# Patient Record
Sex: Female | Born: 1945
Health system: Southern US, Community
[De-identification: ages and names within clinical notes are randomized; demographics above are authoritative.]

## PROBLEM LIST (undated history)

## (undated) DIAGNOSIS — Z87440 Personal history of urinary (tract) infections: Secondary | ICD-10-CM

## (undated) DIAGNOSIS — I10 Essential (primary) hypertension: Secondary | ICD-10-CM

## (undated) DIAGNOSIS — Z8619 Personal history of other infectious and parasitic diseases: Secondary | ICD-10-CM

## (undated) DIAGNOSIS — E079 Disorder of thyroid, unspecified: Secondary | ICD-10-CM

## (undated) DIAGNOSIS — N3281 Overactive bladder: Secondary | ICD-10-CM

## (undated) DIAGNOSIS — E785 Hyperlipidemia, unspecified: Secondary | ICD-10-CM

## (undated) HISTORY — DX: Overactive bladder: N32.81

## (undated) HISTORY — DX: Personal history of urinary (tract) infections: Z87.440

## (undated) HISTORY — DX: Disorder of thyroid, unspecified: E07.9

## (undated) HISTORY — DX: Hyperlipidemia, unspecified: E78.5

## (undated) HISTORY — DX: Essential (primary) hypertension: I10

## (undated) HISTORY — DX: Personal history of other infectious and parasitic diseases: Z86.19

---

## 1998-06-16 ENCOUNTER — Ambulatory Visit (HOSPITAL_COMMUNITY): Admission: RE | Admit: 1998-06-16 | Discharge: 1998-06-16 | Payer: Self-pay | Admitting: Family Medicine

## 1999-06-23 ENCOUNTER — Ambulatory Visit (HOSPITAL_COMMUNITY): Admission: RE | Admit: 1999-06-23 | Discharge: 1999-06-23 | Payer: Self-pay | Admitting: Family Medicine

## 1999-06-23 ENCOUNTER — Encounter: Payer: Self-pay | Admitting: Family Medicine

## 1999-10-29 ENCOUNTER — Ambulatory Visit (HOSPITAL_COMMUNITY): Admission: RE | Admit: 1999-10-29 | Discharge: 1999-10-29 | Payer: Self-pay | Admitting: Gastroenterology

## 2000-05-12 ENCOUNTER — Other Ambulatory Visit: Admission: RE | Admit: 2000-05-12 | Discharge: 2000-05-12 | Payer: Self-pay | Admitting: Gynecology

## 2000-05-12 ENCOUNTER — Encounter (INDEPENDENT_AMBULATORY_CARE_PROVIDER_SITE_OTHER): Payer: Self-pay

## 2000-07-27 ENCOUNTER — Other Ambulatory Visit: Admission: RE | Admit: 2000-07-27 | Discharge: 2000-07-27 | Payer: Self-pay | Admitting: Gynecology

## 2001-05-12 ENCOUNTER — Encounter: Payer: Self-pay | Admitting: Internal Medicine

## 2001-05-12 ENCOUNTER — Ambulatory Visit (HOSPITAL_COMMUNITY): Admission: RE | Admit: 2001-05-12 | Discharge: 2001-05-12 | Payer: Self-pay | Admitting: Internal Medicine

## 2001-08-14 ENCOUNTER — Other Ambulatory Visit: Admission: RE | Admit: 2001-08-14 | Discharge: 2001-08-14 | Payer: Self-pay | Admitting: Gynecology

## 2002-08-08 ENCOUNTER — Other Ambulatory Visit: Admission: RE | Admit: 2002-08-08 | Discharge: 2002-08-08 | Payer: Self-pay | Admitting: Gynecology

## 2003-01-21 ENCOUNTER — Encounter (INDEPENDENT_AMBULATORY_CARE_PROVIDER_SITE_OTHER): Payer: Self-pay

## 2003-01-21 ENCOUNTER — Ambulatory Visit (HOSPITAL_BASED_OUTPATIENT_CLINIC_OR_DEPARTMENT_OTHER): Admission: RE | Admit: 2003-01-21 | Discharge: 2003-01-21 | Payer: Self-pay | Admitting: Gynecology

## 2003-10-02 ENCOUNTER — Other Ambulatory Visit: Admission: RE | Admit: 2003-10-02 | Discharge: 2003-10-02 | Payer: Self-pay | Admitting: Gynecology

## 2004-09-08 ENCOUNTER — Other Ambulatory Visit: Admission: RE | Admit: 2004-09-08 | Discharge: 2004-09-08 | Payer: Self-pay | Admitting: Gynecology

## 2005-09-09 ENCOUNTER — Other Ambulatory Visit: Admission: RE | Admit: 2005-09-09 | Discharge: 2005-09-09 | Payer: Self-pay | Admitting: Gynecology

## 2005-09-22 ENCOUNTER — Ambulatory Visit (HOSPITAL_COMMUNITY): Admission: RE | Admit: 2005-09-22 | Discharge: 2005-09-22 | Payer: Self-pay | Admitting: Gynecology

## 2007-03-15 ENCOUNTER — Other Ambulatory Visit: Admission: RE | Admit: 2007-03-15 | Discharge: 2007-03-15 | Payer: Self-pay | Admitting: Gynecology

## 2009-03-06 ENCOUNTER — Ambulatory Visit (HOSPITAL_COMMUNITY): Admission: RE | Admit: 2009-03-06 | Discharge: 2009-03-06 | Payer: Self-pay | Admitting: Gynecology

## 2010-04-10 ENCOUNTER — Ambulatory Visit (HOSPITAL_COMMUNITY): Admission: RE | Admit: 2010-04-10 | Discharge: 2010-04-10 | Payer: Self-pay | Admitting: Internal Medicine

## 2011-04-26 ENCOUNTER — Other Ambulatory Visit: Payer: Self-pay | Admitting: Gynecology

## 2011-05-14 NOTE — Op Note (Signed)
   NAME:  Jill Daugherty, Jill Daugherty                          ACCOUNT NO.:  [id number]   MEDICAL RECORD NO.:  [id number]                   PATIENT TYPE:  AMB   LOCATION:  [facility]                                 FACILITY:  [facility]   PHYSICIAN:  Gretta Cool, M.D.              DATE OF BIRTH:  19-Apr-1946   DATE OF PROCEDURE:  01/21/2003  DATE OF DISCHARGE:                                 OPERATIVE REPORT   PREOPERATIVE DIAGNOSIS:  Endometrial polyp with recurring abnormal uterine  bleeding.   POSTOPERATIVE DIAGNOSIS:  Endometrial polyp with recurring abnormal uterine  bleeding.   PROCEDURE:  1. Hysteroscopy  2. Resection of endometrial polyp.  3. Total endometrial resection for ablation.   SURGEON:  Gretta Cool, M.D.   ANESTHESIA:  IV sedation to general anesthesia with oral airway plus  paracervical block.   DESCRIPTION OF PROCEDURE:  Under excellent anesthesia, as above, with the  patient prepped and draped in [employer], the cervix was grasped with a  single-tooth tenaculum and pulled down into view.  The cervix was then  progressively dilated with a series of Pratt dilators to accommodate a 7 mm  resectoscope.  The cervix was nulliparous and was therefore difficult to  dilate.  Question of dilating a false passage was considered.  With the  hysteroscopic examination, the endometrial cavity was confirmed.  Once the  endometrial cavity was entered, the entire cavity was noted to be filled by  a polyp.  The cavity was quite small.  With meticulous resection, the entire  endometrial polyp was removed and submitted for pathologic examination.  There were no abnormalities identified and no significant bleeding after  resection of the polyp.  The bleeding was minimal even with minimal reduced  pressure to 30 mm.  At the end of the procedure, there were no  complications.  The patient returned to the recovery room in excellent  condition.     Gretta Cool, M.D.    CWL/MEDQ  D:  01/21/2003  T:  01/21/2003  Job:  [id number]

## 2011-09-15 ENCOUNTER — Emergency Department (HOSPITAL_COMMUNITY)
Admission: EM | Admit: 2011-09-15 | Discharge: 2011-09-15 | Disposition: A | Discharge status: Home / Self Care | Payer: No Typology Code available for payment source | Attending: Surgery | Admitting: Surgery

## 2011-09-15 ENCOUNTER — Emergency Department (HOSPITAL_COMMUNITY): Payer: No Typology Code available for payment source

## 2011-09-15 DIAGNOSIS — I10 Essential (primary) hypertension: Secondary | ICD-10-CM | POA: Insufficient documentation

## 2011-09-15 DIAGNOSIS — R079 Chest pain, unspecified: Secondary | ICD-10-CM | POA: Insufficient documentation

## 2011-11-09 ENCOUNTER — Other Ambulatory Visit: Payer: Self-pay | Admitting: Internal Medicine

## 2011-11-09 ENCOUNTER — Ambulatory Visit (HOSPITAL_COMMUNITY)
Admission: RE | Admit: 2011-11-09 | Discharge: 2011-11-09 | Disposition: A | Discharge status: Home / Self Care | Payer: Medicare Other | Source: Ambulatory Visit | Attending: Internal Medicine | Admitting: Internal Medicine

## 2011-11-09 DIAGNOSIS — M25559 Pain in unspecified hip: Secondary | ICD-10-CM | POA: Insufficient documentation

## 2011-11-09 DIAGNOSIS — R52 Pain, unspecified: Secondary | ICD-10-CM

## 2011-11-09 DIAGNOSIS — M79609 Pain in unspecified limb: Secondary | ICD-10-CM | POA: Insufficient documentation

## 2011-11-25 ENCOUNTER — Ambulatory Visit (HOSPITAL_COMMUNITY)
Admission: RE | Admit: 2011-11-25 | Discharge: 2011-11-25 | Disposition: A | Discharge status: Home / Self Care | Payer: Medicare Other | Source: Ambulatory Visit | Attending: Internal Medicine | Admitting: Internal Medicine

## 2011-11-25 DIAGNOSIS — R52 Pain, unspecified: Secondary | ICD-10-CM

## 2011-11-25 DIAGNOSIS — M79609 Pain in unspecified limb: Secondary | ICD-10-CM | POA: Insufficient documentation

## 2011-11-25 NOTE — Progress Notes (Signed)
*  PRELIMINARY RESULTS*  ABIs has been performed.  ABIs demonstrate normal arterial blood flow bilaterally at rest.  Jill Daugherty 11/25/2011, 11:57 AM

## 2012-07-26 ENCOUNTER — Other Ambulatory Visit: Payer: Self-pay | Admitting: Gynecology

## 2013-06-21 ENCOUNTER — Encounter: Payer: Self-pay | Admitting: Internal Medicine

## 2013-06-21 ENCOUNTER — Ambulatory Visit (INDEPENDENT_AMBULATORY_CARE_PROVIDER_SITE_OTHER): Payer: Medicare Other | Admitting: Internal Medicine

## 2013-06-21 VITALS — BP 148/88 | HR 52 | Temp 97.4°F | Ht 65.0 in | Wt 176.0 lb

## 2013-06-21 DIAGNOSIS — I152 Hypertension secondary to endocrine disorders: Secondary | ICD-10-CM | POA: Insufficient documentation

## 2013-06-21 DIAGNOSIS — E1169 Type 2 diabetes mellitus with other specified complication: Secondary | ICD-10-CM | POA: Insufficient documentation

## 2013-06-21 DIAGNOSIS — I10 Essential (primary) hypertension: Secondary | ICD-10-CM

## 2013-06-21 DIAGNOSIS — E039 Hypothyroidism, unspecified: Secondary | ICD-10-CM

## 2013-06-21 DIAGNOSIS — E785 Hyperlipidemia, unspecified: Secondary | ICD-10-CM

## 2013-06-21 NOTE — Assessment & Plan Note (Signed)
Will obtain most recent labs from Dr. Chestine Spore Continue current therapy

## 2013-06-21 NOTE — Assessment & Plan Note (Signed)
Elevated today but pt has not yet taken her medications Continue on a low sodium diet Work on exercise

## 2013-06-21 NOTE — Assessment & Plan Note (Signed)
Just started on synthroid No side effects she has noticed yet We will get labs from Dr. Chestine Spore

## 2013-06-21 NOTE — Progress Notes (Signed)
HPI  Pt presents to the clinic today to establish care. She does have a PCP Dr. Chestine Spore who manages her chronic health conditions. She was seeing Dr. Nicholas Lose for her gyn issues but he has now retired. She would like a female provider for her pap's and mammogram. That is why she is here today. She has no concerns today.  Flu: 08/2012 Tetanus: unsure Pneumovax: unsure Zostavax: unsure Pap: 2013- normal Mammogram: 2012 Eye doctor: yearly Dentist: yearly Colonoscopy: 1 time in the past but no longer wants to screen Bone density: unsure  Past Medical History  Diagnosis Date  . History of chicken pox   . Hypertension   . Hyperlipidemia   . Thyroid disease     Current Outpatient Prescriptions  Medication Sig Dispense Refill  . atenolol (TENORMIN) 100 MG tablet Take 100 mg by mouth daily.       Marland Kitchen atorvastatin (LIPITOR) 20 MG tablet Take 20 mg by mouth daily.       . hydrochlorothiazide (MICROZIDE) 12.5 MG capsule Take 12.5 mg by mouth daily.       Marland Kitchen levothyroxine (SYNTHROID, LEVOTHROID) 75 MCG tablet Take 75 mcg by mouth daily before breakfast.      . trimethoprim (TRIMPEX) 100 MG tablet Take 100 mg by mouth at bedtime.       No current facility-administered medications for this visit.    No Known Allergies  Family History  Problem Relation Age of Onset  . Heart disease Father   . Diabetes Paternal Grandmother   . Cancer Paternal Grandmother     History   Social History  . Marital Status: Married    Spouse Name: N/A    Number of Children: 1  . Years of Education: 12   Occupational History  . Retired    Social History Main Topics  . Smoking status: Never Smoker   . Smokeless tobacco: Never Used  . Alcohol Use: No  . Drug Use: No  . Sexually Active: Not Currently   Other Topics Concern  . Not on file   Social History Narrative   Regular exercise-no   Caffeine Use-yes    ROS:  Constitutional: Denies fever, malaise, fatigue, headache or abrupt weight changes.   HEENT: Denies eye pain, eye redness, ear pain, ringing in the ears, wax buildup, runny nose, nasal congestion, bloody nose, or sore throat. Respiratory: Denies difficulty breathing, shortness of breath, cough or sputum production.   Cardiovascular: Denies chest pain, chest tightness, palpitations or swelling in the hands or feet.  Gastrointestinal: Pt reports gas. Denies abdominal pain, bloating, constipation, diarrhea or blood in the stool.  GU: Denies frequency, urgency, pain with urination, blood in urine, odor or discharge. Musculoskeletal: Denies decrease in range of motion, difficulty with gait, muscle pain or joint pain and swelling.  Skin: Denies redness, rashes, lesions or ulcercations.  Neurological: Denies dizziness, difficulty with memory, difficulty with speech or problems with balance and coordination.   No other specific complaints in a complete review of systems (except as listed in HPI above).  PE:  BP 148/88  Pulse 52  Temp(Src) 97.4 F (36.3 C) (Oral)  Ht 5\' 5"  (1.651 m)  Wt 176 lb (79.833 kg)  BMI 29.29 kg/m2  SpO2 96% Wt Readings from Last 3 Encounters:  06/21/13 176 lb (79.833 kg)    General: Appears her stated age, well developed, well nourished in NAD. HEENT: Head: normal shape and size; Eyes: sclera white, no icterus, conjunctiva pink, PERRLA and EOMs intact; Ears: Tm's  gray and intact, normal light reflex; Nose: mucosa pink and moist, septum midline; Throat/Mouth: Teeth present, mucosa pink and moist, no lesions or ulcerations noted.  Neck: Normal range of motion. Neck supple, trachea midline. No massses, lumps or thyromegaly present.  Cardiovascular: Normal rate and rhythm. S1,S2 noted.  No murmur, rubs or gallops noted. No JVD or BLE edema. No carotid bruits noted. Pulmonary/Chest: Normal effort and positive vesicular breath sounds. No respiratory distress. No wheezes, rales or ronchi noted.  Abdomen: Soft and nontender. Normal bowel sounds, no bruits  noted. No distention or masses noted. Liver, spleen and kidneys non palpable. Musculoskeletal: Normal range of motion. No signs of joint swelling. No difficulty with gait.  Neurological: Alert and oriented. Cranial nerves II-XII intact. Coordination normal. +DTRs bilaterally. Psychiatric: Mood and affect normal. Behavior is normal. Judgment and thought content normal.      Assessment and Plan:  Preventative Health:  Will obtain records from Dr. Nicholas Lose and Dr. Chestine Spore to see if HM is UTD Schedule your mammogram before the end of the year Will repeat pap next year

## 2013-06-21 NOTE — Patient Instructions (Signed)

## 2013-06-21 NOTE — Assessment & Plan Note (Signed)
>>  ASSESSMENT AND PLAN FOR HYPERLIPIDEMIA ASSOCIATED WITH TYPE 2 DIABETES MELLITUS ([facility]) WRITTEN ON 06/21/2013 10:05 AM BY Lorre Munroe, NP  Will obtain most recent labs from Dr. Chestine Spore Continue current therapy

## 2013-10-19 ENCOUNTER — Ambulatory Visit (INDEPENDENT_AMBULATORY_CARE_PROVIDER_SITE_OTHER): Payer: Medicare Other | Admitting: Internal Medicine

## 2013-10-19 ENCOUNTER — Encounter: Payer: Self-pay | Admitting: Internal Medicine

## 2013-10-19 VITALS — BP 128/82 | HR 78 | Temp 98.6°F | Wt 172.8 lb

## 2013-10-19 DIAGNOSIS — R3915 Urgency of urination: Secondary | ICD-10-CM

## 2013-10-19 DIAGNOSIS — R350 Frequency of micturition: Secondary | ICD-10-CM

## 2013-10-19 DIAGNOSIS — N3090 Cystitis, unspecified without hematuria: Secondary | ICD-10-CM

## 2013-10-19 LAB — POCT URINALYSIS DIPSTICK
Bilirubin, UA: NEGATIVE
Ketones, UA: NEGATIVE
Protein, UA: NEGATIVE
Spec Grav, UA: <=1.005
pH, UA: 5

## 2013-10-19 NOTE — Patient Instructions (Signed)

## 2013-10-19 NOTE — Progress Notes (Signed)
HPI  Pt presents to the clinic today with c/o urinary frequency, urgency and overactive bladder. She was on trimethoprim for the past 2 years and Dr. Chestine Spore took her off of this medication to see if she could tolerate the wean. Since that time, she has had an increase in urinary symptoms. She is scheduled to followup with Dr. Chestine Spore on 10/28/2013. She denies fever, chills, nausea or back pain. She has not tried anything OTC.   Review of Systems  Past Medical History  Diagnosis Date  . History of chicken pox   . Hypertension   . Hyperlipidemia   . Thyroid disease     Family History  Problem Relation Age of Onset  . Heart disease Father   . Diabetes Paternal Grandmother   . Cancer Paternal Grandmother     History   Social History  . Marital Status: Married    Spouse Name: N/A    Number of Children: 1  . Years of Education: 12   Occupational History  . Retired    Social History Main Topics  . Smoking status: Never Smoker   . Smokeless tobacco: Never Used  . Alcohol Use: No  . Drug Use: No  . Sexual Activity: Not Currently   Other Topics Concern  . Not on file   Social History Narrative   Regular exercise-no   Caffeine Use-yes    No Known Allergies  Constitutional: Denies fever, malaise, fatigue, headache or abrupt weight changes.   GU: Pt reports urgency, frequency. Denies pain with urination, burning sensation, blood in urine, odor or discharge. Skin: Denies redness, rashes, lesions or ulcercations.   No other specific complaints in a complete review of systems (except as listed in HPI above).    Objective:   Physical Exam  BP 128/82  Pulse 78  Temp(Src) 98.6 F (37 C) (Oral)  Wt 172 lb 12.8 oz (78.382 kg)  BMI 28.76 kg/m2  SpO2 98% Wt Readings from Last 3 Encounters:  10/19/13 172 lb 12.8 oz (78.382 kg)  06/21/13 176 lb (79.833 kg)    General: Appears her stated age, well developed, well nourished in NAD. Cardiovascular: Normal rate and rhythm.  S1,S2 noted.  No murmur, rubs or gallops noted. No JVD or BLE edema. No carotid bruits noted. Pulmonary/Chest: Normal effort and positive vesicular breath sounds. No respiratory distress. No wheezes, rales or ronchi noted.  Abdomen: Soft and nontender. Normal bowel sounds, no bruits noted. No distention or masses noted. Liver, spleen and kidneys non palpable. Tender to palpation over the bladder area. No CVA tenderness.      Assessment & Plan:   Urgency, frequency secondary to cystitis, chronic:  Urinalysis-normal Restart the trimethoprim 100 mg daily Keep your follow up with Dr. Chestine Spore on 11/2 Watch for burning with urination, pain, fever or back pain- RTC if these develop Drink plenty of fluids  RTC as needed or if symptoms persist.

## 2013-10-19 NOTE — Progress Notes (Signed)
HPI: Pt presents to the office today with complaints of urinary urgency/frequency for two days. Pt states she feels she cannot completely emptying her bladder. Pt usually see Dr. Chestine Spore who has been treating her for frequent urinary tract infections vs overactive bladder. Pt was taking Trimpex 100mg  every night for over a year and was recently stopped per Dr. Chestine Spore as a trial for discontinuation. Pt states she did well without the medicaiton for 3 weeks, however started having symptoms. She denies fever, chills, burning with urination, pain, or blood in urine.   Past Medical History  Diagnosis Date  . History of chicken pox   . Hypertension   . Hyperlipidemia   . Thyroid disease   . Overactive bladder   . History of frequent urinary tract infections     Current Outpatient Prescriptions  Medication Sig Dispense Refill  . atenolol (TENORMIN) 100 MG tablet Take 100 mg by mouth daily.       Marland Kitchen atorvastatin (LIPITOR) 20 MG tablet Take 20 mg by mouth daily.       . hydrochlorothiazide (MICROZIDE) 12.5 MG capsule Take 12.5 mg by mouth daily.       Marland Kitchen levothyroxine (SYNTHROID, LEVOTHROID) 75 MCG tablet Take 75 mcg by mouth daily before breakfast.       No current facility-administered medications for this visit.    No Known Allergies   ROS:  Constitutional: Denies fever, malaise, fatigue, headache or abrupt weight changes.   Gastrointestinal: Denies abdominal pain, bloating, constipation, diarrhea or blood in the stool.  GU: Denies frequency, urgency, pain with urination, blood in urine, odor or discharge.  Neurological: Denies dizziness, difficulty with memory, difficulty with speech or problems with balance and coordination.   No other specific complaints in a complete review of systems (except as listed in HPI above).  PE:  BP 128/82  Pulse 78  Temp(Src) 98.6 F (37 C) (Oral)  Wt 172 lb 12.8 oz (78.382 kg)  BMI 28.76 kg/m2  SpO2 98% Wt Readings from Last 3 Encounters:  10/19/13  172 lb 12.8 oz (78.382 kg)  06/21/13 176 lb (79.833 kg)    General: Appears their stated age, well developed, well nourished in NAD. Abdomen: Soft and nontender. Normal bowel sounds, no bruits noted. No distention or masses noted. Liver, spleen and kidneys non palpable.No CVA tenderness. Musculoskeletal: Normal range of motion. No signs of joint swelling. No difficulty with gait.  Psychiatric: Mood and affect normal. Behavior is normal. Judgment and thought content normal.   Assessment and Plan: Restart Trimpex 100mg  by mouth daily at bedtime Try OTC Cranberry tablets as directed per box instructions for symptoms relief Follow up with Dr. Chestine Spore on Monday for appointment sooner than Nov. 2nd  Raahi Korber S, Student-NP

## 2013-10-29 ENCOUNTER — Other Ambulatory Visit: Payer: Self-pay | Admitting: Internal Medicine

## 2013-10-29 DIAGNOSIS — N760 Acute vaginitis: Secondary | ICD-10-CM

## 2013-10-29 DIAGNOSIS — Z1231 Encounter for screening mammogram for malignant neoplasm of breast: Secondary | ICD-10-CM

## 2013-10-29 DIAGNOSIS — E2839 Other primary ovarian failure: Secondary | ICD-10-CM

## 2013-12-07 ENCOUNTER — Ambulatory Visit
Admission: RE | Admit: 2013-12-07 | Discharge: 2013-12-07 | Disposition: A | Discharge status: Home / Self Care | Payer: Medicare Other | Source: Ambulatory Visit | Attending: Internal Medicine | Admitting: Internal Medicine

## 2013-12-07 DIAGNOSIS — E2839 Other primary ovarian failure: Secondary | ICD-10-CM

## 2013-12-07 DIAGNOSIS — Z1231 Encounter for screening mammogram for malignant neoplasm of breast: Secondary | ICD-10-CM

## 2014-01-02 LAB — HEMOGLOBIN A1C: Hgb A1c MFr Bld: 7.4 % — AB (ref 4.0–6.0)

## 2014-01-22 ENCOUNTER — Ambulatory Visit (INDEPENDENT_AMBULATORY_CARE_PROVIDER_SITE_OTHER): Payer: Medicare Other | Admitting: Internal Medicine

## 2014-01-22 ENCOUNTER — Encounter: Payer: Self-pay | Admitting: Internal Medicine

## 2014-01-22 VITALS — BP 130/80 | HR 54 | Temp 98.1°F | Wt 170.2 lb

## 2014-01-22 DIAGNOSIS — N390 Urinary tract infection, site not specified: Secondary | ICD-10-CM

## 2014-01-22 DIAGNOSIS — N3281 Overactive bladder: Secondary | ICD-10-CM

## 2014-01-22 DIAGNOSIS — N318 Other neuromuscular dysfunction of bladder: Secondary | ICD-10-CM

## 2014-01-22 MED ORDER — TRIMETHOPRIM 100 MG PO TABS: 100.0000 mg | ORAL_TABLET | Freq: Every day | ORAL | Status: DC

## 2014-01-22 NOTE — Progress Notes (Signed)
Subjective:    Patient ID: Jill Daugherty, female    DOB: [birth year], 68 y.o.   MRN: [id number]  HPI  Pt presents to the clinic today to discuss her Trimpex. She reports that in 08/2013, Dr. Carlis Abbott, took her off the medication to see if her symptoms had resolved. Since that time, she has had multiple urinary infections and has issues with overactive bladder. Dr. Carlis Abbott is refusing to put her back on the Trimpex because he states that she doesn't need it. He thinks it interferes with other medications that she is on. She would like a letter stating that she needs to be back on the Trimpex.  Review of Systems      Past Medical History  Diagnosis Date  . History of chicken pox   . Hypertension   . Hyperlipidemia   . Thyroid disease   . Overactive bladder   . History of frequent urinary tract infections     Current Outpatient Prescriptions  Medication Sig Dispense Refill  . atenolol (TENORMIN) 100 MG tablet Take 100 mg by mouth daily.       Marland Kitchen atorvastatin (LIPITOR) 20 MG tablet Take 20 mg by mouth daily.       . hydrochlorothiazide (MICROZIDE) 12.5 MG capsule Take 12.5 mg by mouth daily.       Marland Kitchen levothyroxine (SYNTHROID, LEVOTHROID) 75 MCG tablet Take 75 mcg by mouth daily before breakfast.      . trimethoprim (TRIMPEX) 100 MG tablet Take 100 mg by mouth at bedtime.       No current facility-administered medications for this visit.    No Known Allergies  Family History  Problem Relation Age of Onset  . Heart disease Father   . Diabetes Paternal Grandmother   . Cancer Paternal Grandmother     History   Social History  . Marital Status: Married    Spouse Name: N/A    Number of Children: 1  . Years of Education: 12   Occupational History  . Retired    Social History Main Topics  . Smoking status: Never Smoker   . Smokeless tobacco: Never Used  . Alcohol Use: No  . Drug Use: No  . Sexual Activity: Not Currently   Other Topics Concern  . Not on file   Social  History Narrative   Regular exercise-no   Caffeine Use-yes     Constitutional: Denies fever, malaise, fatigue, headache or abrupt weight changes.  GU: Pt reports frequency and incontinence. Denies urgency, pain with urination, burning sensation, blood in urine, odor or discharge.   No other specific complaints in a complete review of systems (except as listed in HPI above).  Objective:   Physical Exam   BP 130/80  Pulse 54  Temp(Src) 98.1 F (36.7 C) (Oral)  Wt 170 lb 4 oz (77.225 kg)  SpO2 98% Wt Readings from Last 3 Encounters:  01/22/14 170 lb 4 oz (77.225 kg)  10/19/13 172 lb 12.8 oz (78.382 kg)  06/21/13 176 lb (79.833 kg)    General: Appears her stated age, overweight but well developed, well nourished in NAD. Cardiovascular: Normal rate and rhythm. S1,S2 noted.  No murmur, rubs or gallops noted. No JVD or BLE edema. No carotid bruits noted. Pulmonary/Chest: Normal effort and positive vesicular breath sounds. No respiratory distress. No wheezes, rales or ronchi noted.  Abdomen: Soft and nontender. Normal bowel sounds, no bruits noted. No distention or masses noted. Liver, spleen and kidneys non palpable.  Assessment & Plan:   OAB with frequent UTI's:  Educated her about the difference in Trimpex and medications used to treat OAB such as Ditropan or Myrbetriq. 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

## 2014-01-22 NOTE — Progress Notes (Signed)
Pre-visit discussion using our clinic review tool. No additional management support is needed unless otherwise documented below in the visit note.  

## 2014-01-22 NOTE — Patient Instructions (Signed)
Overactive Bladder, Adult The bladder has two functions that are totally opposite of the other. One is to relax and stretch out so it can store urine (fills like a balloon), and the other is to contract and squeeze down so that it can empty the urine that it has stored. Proper functioning of the bladder is a complex mixing of these two functions. The filling and emptying of the bladder can be influenced by:  The bladder.  The spinal cord.  The brain.  The nerves going to the bladder.  Other organs that are closely related to the bladder such as prostate in males and the vagina in females. As your bladder fills with urine, nerve signals are sent from the bladder to the brain to tell you that you may need to urinate. Normal urination requires that the bladder squeeze down with sufficient strength to empty the bladder, but this also requires that the bladder squeeze down sufficiently long to finish the job. In addition the sphincter muscles, which normally keep you from leaking urine, must also relax so that the urine can pass. Coordination between the bladder muscle squeezing down and the sphincter muscles relaxing is required to make everything happen normally. With an overactive bladder sometimes the muscles of the bladder contract unexpectedly and involuntarily and this causes an urgent need to urinate. The normal response is to try to hold urine in by contracting the sphincter muscles. Sometimes the bladder contracts so strongly that the sphincter muscles cannot stop the urine from passing out and incontinence occurs. This kind of incontinence is called urge incontinence. Having an overactive bladder can be embarrassing and awkward. It can keep you from living life the way you want to. Many people think it is just something you have to put up with as you grow older or have certain health conditions. In fact, there are treatments that can help make your life easier and more pleasant. CAUSES  Many  things can cause an overactive bladder. Possibilities include:  Urinary tract infection or infection of nearby tissues such as the prostate.  Prostate enlargement.  In women, multiple pregnancies or surgery on the uterus or urethra.  Bladder stones, inflammation or tumors.  Caffeine.  Alcohol.  Medications. For example, diuretics (drugs that help the body get rid of extra fluid) increase urine production. Some other medicines must be taken with lots of fluids.  Muscle or nerve weakness. This might be the result of a spinal cord injury, a stroke, multiple sclerosis or Parkinson's disease.  Diabetes can cause a high urine volume which fills the bladder so quickly that the normal urge to urinate is triggered very strongly. SYMPTOMS   Loss of bladder control. You feel the need to urinate and cannot make your body wait.  Sudden, strong urges to urinate.  Urinating 8 or more times a day.  Waking up to urinate two or more times a night. DIAGNOSIS  To decide if you have overactive bladder, your healthcare provider will probably:  Ask about symptoms you have noticed.  Ask about your overall health. This will include questions about any medications you are taking.  Do a physical examination. This will help determine if there are obvious blockages or other problems.  Order some tests. These might include:  A blood test to check for diabetes or other health issues that could be contributing to the problem.  Urine testing. This could measure the flow of urine and the pressure on the bladder.  A test of your neurological   system (the brain, spinal cord and nerves). This is the system that senses the need to urinate. Some of these tests are called flow tests, bladder pressure tests and electrical measurements of the sphincter muscle.  A bladder test to check whether it is emptying completely when you urinate.  Cytoscopy. This test uses a thin tube with a tiny camera on it. It offers a  look inside your urethra and bladder to see if there are problems.  Imaging tests. You might be given a contrast dye and then asked to urinate. X-rays are taken to see how your bladder is working. TREATMENT  An overactive bladder can be treated in many ways. The treatment will depend on the cause. Whether you have a mild or severe case also makes a difference. Often, treatment can be given in your healthcare provider's office or clinic. Be sure to discuss the different options with your caregiver. They include:  Behavioral treatments. These do not involve medication or surgery:  Bladder training. For this, you would follow a schedule to urinate at regular intervals. This helps you learn to control the urge to urinate. At first, you might be asked to wait a few minutes after feeling the urge. In time, you should be able to schedule bathroom visits an hour or more apart.  Kegel exercises. These exercises strengthen the pelvic floor muscles, which support the bladder. By toning these muscles, they can help control urination, even if the bladder muscles are overactive. A specialist will teach you how to do these exercises correctly. They will require daily practice.  Weight loss. If you are obese or overweight, losing weight might stop your bladder from being overactive. Talk to your healthcare provider about how many pounds you should lose. Also ask if there is a specific program or method that would work best for you.  Diet change. This might be suggested if constipation is making your overactive bladder worse. Your healthcare provider or a nutritionist can explain ways to change what you eat to ease constipation. Other people might need to take in less caffeine or alcohol. Sometimes drinking fewer fluids is needed, too.  Protection. This is not an actual treatment. But, you could wear special pads to take care of any leakage while you wait for other treatments to take effect. This will help you avoid  embarrassment.  Physical treatments.  Electrical stimulation. Electrodes will send gentle pulses to the nerves or muscles that help control the bladder. The goal is to strengthen them. Sometimes this is done with the electrodes outside of the body. Or, they might be placed inside the body (implanted). This treatment can take several months to have an effect.  Medications. These are usually used along with other treatments. Several medicines are available. Some are injected into the muscles involved in urination. Others come in pill form. Medications sometimes prescribed include:  Anticholinergics. These drugs block the signals that the nerves deliver to the bladder. This keeps it from releasing urine at the wrong time. Researchers think the drugs might help in other ways, too.  Imipramine. This is an antidepressant. But, it relaxes bladder muscles.  Botox. This is still experimental. Some people believe that injecting it into the bladder muscles will relax them so they work more normally. It has also been injected into the sphincter muscle when the sphincter muscle does not open properly. This is a temporary fix, however. Also, it might make matters worse, especially in older people.  Surgery.  A device might be implanted  bladder muscles will relax them so they work more normally. It has also been injected into the sphincter muscle when the sphincter muscle does not open properly. This is a temporary fix, however. Also, it might make matters worse, especially in older people.  · Surgery.  · A device might be implanted to help manage your nerves. It works on the nerves that signal when you need to urinate.  · Surgery is sometimes needed with electrical stimulation. If the electrodes are implanted, this is done through surgery.  · Sometimes repairs need to be made through surgery. For example, the size of the bladder can be changed. This is usually done in severe cases only.  HOME CARE INSTRUCTIONS   · Take any medications your healthcare provider prescribed or suggested. Follow the directions carefully.  · Practice any lifestyle changes that are recommended. These might include:  · Drinking less fluid or drinking at different times of the day. If you need to urinate often during the night, for  example, you may need to stop drinking fluids early in the evening.  · Cutting down on caffeine or alcohol. They can both make an overactive bladder worse. Caffeine is found in coffee, tea and sodas.  · Doing Kegel exercises to strengthen muscles.  · Losing weight, if that is recommended.  · Eating a healthy and balanced diet. This will help you avoid constipation.  · Keep a journal or a log. You might be asked to record how much you drink and when, and also when you feel the need to urinate.  · Learn how to care for implants or other devices, such as pessaries.  SEEK MEDICAL CARE IF:   · Your overactive bladder gets worse.  · You feel increased pain or irritation when you urinate.  · You notice blood in your urine.  · You have questions about any medications or devices that your healthcare provider recommended.  · You notice blood, pus or swelling at the site of any test or treatment procedure.  · You have an oral temperature above 102° F (38.9° C).  SEEK IMMEDIATE MEDICAL CARE IF:   You have an oral temperature above 102° F (38.9° C), not controlled by medicine.  Document Released: 10/09/2009 Document Revised: 03/06/2012 Document Reviewed: 10/09/2009  Exit[employer]® Patient Information ©2014 [employer].

## 2014-07-19 ENCOUNTER — Telehealth: Payer: Self-pay

## 2014-07-19 NOTE — Telephone Encounter (Signed)
PA has been faxed to [facility] for trimethoprim--awaiting response--

## 2014-11-15 ENCOUNTER — Other Ambulatory Visit: Payer: Self-pay

## 2015-02-18 DIAGNOSIS — J0101 Acute recurrent maxillary sinusitis: Secondary | ICD-10-CM | POA: Diagnosis not present

## 2015-02-18 DIAGNOSIS — I10 Essential (primary) hypertension: Secondary | ICD-10-CM | POA: Diagnosis not present

## 2015-02-18 DIAGNOSIS — E039 Hypothyroidism, unspecified: Secondary | ICD-10-CM | POA: Diagnosis not present

## 2015-02-18 DIAGNOSIS — E119 Type 2 diabetes mellitus without complications: Secondary | ICD-10-CM | POA: Diagnosis not present

## 2015-03-26 DIAGNOSIS — E119 Type 2 diabetes mellitus without complications: Secondary | ICD-10-CM | POA: Diagnosis not present

## 2015-03-26 DIAGNOSIS — J0101 Acute recurrent maxillary sinusitis: Secondary | ICD-10-CM | POA: Diagnosis not present

## 2015-03-26 DIAGNOSIS — E039 Hypothyroidism, unspecified: Secondary | ICD-10-CM | POA: Diagnosis not present

## 2015-05-02 ENCOUNTER — Other Ambulatory Visit: Payer: Self-pay | Admitting: *Deleted

## 2015-05-02 MED ORDER — TRIMETHOPRIM 100 MG PO TABS: 100.0000 mg | ORAL_TABLET | Freq: Every day | ORAL | Status: DC

## 2015-05-16 ENCOUNTER — Other Ambulatory Visit: Payer: Self-pay

## 2015-05-16 DIAGNOSIS — D485 Neoplasm of uncertain behavior of skin: Secondary | ICD-10-CM | POA: Diagnosis not present

## 2015-05-16 DIAGNOSIS — L820 Inflamed seborrheic keratosis: Secondary | ICD-10-CM | POA: Diagnosis not present

## 2015-06-10 DIAGNOSIS — R51 Headache: Secondary | ICD-10-CM | POA: Diagnosis not present

## 2015-06-10 DIAGNOSIS — J0101 Acute recurrent maxillary sinusitis: Secondary | ICD-10-CM | POA: Diagnosis not present

## 2015-06-10 DIAGNOSIS — R1012 Left upper quadrant pain: Secondary | ICD-10-CM | POA: Diagnosis not present

## 2015-06-10 DIAGNOSIS — E039 Hypothyroidism, unspecified: Secondary | ICD-10-CM | POA: Diagnosis not present

## 2015-06-10 DIAGNOSIS — E119 Type 2 diabetes mellitus without complications: Secondary | ICD-10-CM | POA: Diagnosis not present

## 2015-06-10 DIAGNOSIS — R109 Unspecified abdominal pain: Secondary | ICD-10-CM | POA: Diagnosis not present

## 2015-06-10 DIAGNOSIS — I10 Essential (primary) hypertension: Secondary | ICD-10-CM | POA: Diagnosis not present

## 2015-06-23 ENCOUNTER — Encounter: Payer: Self-pay | Admitting: Internal Medicine

## 2015-06-23 ENCOUNTER — Ambulatory Visit (INDEPENDENT_AMBULATORY_CARE_PROVIDER_SITE_OTHER): Payer: Medicare Other | Admitting: Internal Medicine

## 2015-06-23 VITALS — BP 138/82 | HR 55 | Temp 98.2°F | Wt 172.5 lb

## 2015-06-23 DIAGNOSIS — M109 Gout, unspecified: Secondary | ICD-10-CM

## 2015-06-23 DIAGNOSIS — I10 Essential (primary) hypertension: Secondary | ICD-10-CM

## 2015-06-23 DIAGNOSIS — K219 Gastro-esophageal reflux disease without esophagitis: Secondary | ICD-10-CM

## 2015-06-23 DIAGNOSIS — E785 Hyperlipidemia, unspecified: Secondary | ICD-10-CM | POA: Diagnosis not present

## 2015-06-23 DIAGNOSIS — E039 Hypothyroidism, unspecified: Secondary | ICD-10-CM

## 2015-06-23 DIAGNOSIS — N3281 Overactive bladder: Secondary | ICD-10-CM | POA: Insufficient documentation

## 2015-06-23 DIAGNOSIS — E119 Type 2 diabetes mellitus without complications: Secondary | ICD-10-CM

## 2015-06-23 DIAGNOSIS — E1122 Type 2 diabetes mellitus with diabetic chronic kidney disease: Secondary | ICD-10-CM | POA: Insufficient documentation

## 2015-06-23 MED ORDER — NYSTATIN 100000 UNIT/GM EX CREA: 1.0000 "application " | TOPICAL_CREAM | Freq: Two times a day (BID) | CUTANEOUS | Status: DC

## 2015-06-23 MED ORDER — TRIMETHOPRIM 100 MG PO TABS: 100.0000 mg | ORAL_TABLET | Freq: Every day | ORAL | Status: DC

## 2015-06-23 NOTE — Assessment & Plan Note (Signed)
No issues on Allopurinol

## 2015-06-23 NOTE — Assessment & Plan Note (Signed)
>>  ASSESSMENT AND PLAN FOR DM2 (DIABETES MELLITUS, TYPE 2) ([facility]) WRITTEN ON 06/23/2015 11:24 AM BY BAITY, REGINA W, NP  Continue Janumet as per endo Continue checking fasting sugars Eye exam UTD Flu and Pneumovax UTD- will request immunizations from endo Foot exam today

## 2015-06-23 NOTE — Assessment & Plan Note (Signed)
>>  ASSESSMENT AND PLAN FOR HYPERLIPIDEMIA ASSOCIATED WITH TYPE 2 DIABETES MELLITUS ([facility]) WRITTEN ON 06/23/2015 11:22 AM BY Nicki Reaper W, NP  No issues on Lipitor Will request recent labs from endo Handout given on low fat diet

## 2015-06-23 NOTE — Assessment & Plan Note (Signed)
Controlled on Prilosec Will request recent labs from endo

## 2015-06-23 NOTE — Assessment & Plan Note (Signed)
No issues on current dose of Synthroid Labs recently done by endo, will request those Continue to follow with endocrinology

## 2015-06-23 NOTE — Assessment & Plan Note (Signed)
Well controlled on multiple meds She denies adverse effects She had a recent CBC and CMET from endo- will request labsrecords today

## 2015-06-23 NOTE — Assessment & Plan Note (Signed)
Improved and less UTI's on Trimpex Will continue for now

## 2015-06-23 NOTE — Progress Notes (Signed)
Subjective:    Patient ID: Jill Daugherty, female    DOB: [birth year], 69 y.o.   MRN: [id number]  HPI  Pt presents to the clinic today for 6 month follow up of chronic conditions.  Gout: She reports she has not had a flare recently. She takes Allopurinol daily as prescribed. She tries to avoid red meat, alcohol and foods containing purines.  HTN: Her BP is well controlled on Norvasc, Atenolol and HCTZ. Her BP today is 138/82.  HLD: She denies myalgias on Lipitor. She does try to consume a low fat diet.  Hypothyroidism: She denies s/s of hypothyroidism on current dose of Synthroid. She reports she had all of her labs checked 1 week ago by Dr. Carlis Abbott.  DM2: She sees Dr. Carlis Abbott. She is not sure what her last A1C was. She reports he took her off her Janumet and advised her not to take it unless her sugar is > 135. Her fasting sugars range 119-120. Eye exam 08/2014. She reports her flu and pneumonia vaccines are UTD but she can not remember the specific dates. She denies numbness and tingling in her hands or feet.  GERD: She denies breakthrough symptoms on Prilosec.  OAB with frequent UTI's: She reports her symptoms are well controlled on Trimprex. She voids less frequently and has not had any recent infections. She reports that Dr. Carlis Abbott wants her to come off this medication.  Review of Systems      Past Medical History  Diagnosis Date  . History of chicken pox   . Hypertension   . Hyperlipidemia   . Thyroid disease   . Overactive bladder   . History of frequent urinary tract infections     Current Outpatient Prescriptions  Medication Sig Dispense Refill  . allopurinol (ZYLOPRIM) 100 MG tablet Take 1 tablet by mouth daily.    Marland Kitchen amLODipine (NORVASC) 5 MG tablet Take 1 tablet by mouth daily.    Marland Kitchen atenolol (TENORMIN) 100 MG tablet Take 100 mg by mouth daily.     Marland Kitchen atorvastatin (LIPITOR) 20 MG tablet Take 20 mg by mouth daily.     . hydrochlorothiazide (MICROZIDE) 12.5 MG capsule  Take 12.5 mg by mouth daily.     Marland Kitchen levothyroxine (SYNTHROID, LEVOTHROID) 75 MCG tablet Take 75 mcg by mouth daily before breakfast.    . sitaGLIPtin-metformin (JANUMET) 50-1000 MG per tablet Take 1 tablet by mouth daily as needed.    . trimethoprim (TRIMPEX) 100 MG tablet Take 1 tablet (100 mg total) by mouth at bedtime. 30 tablet 0  . omeprazole (PRILOSEC) 20 MG capsule Take 20 mg by mouth.     No current facility-administered medications for this visit.    No Known Allergies  Family History  Problem Relation Age of Onset  . Heart disease Father   . Diabetes Paternal Grandmother   . Cancer Paternal Grandmother     History   Social History  . Marital Status: Married    Spouse Name: N/A  . Number of Children: 1  . Years of Education: 12   Occupational History  . Retired    Social History Main Topics  . Smoking status: Never Smoker   . Smokeless tobacco: Never Used  . Alcohol Use: No  . Drug Use: No  . Sexual Activity: Not Currently   Other Topics Concern  . Not on file   Social History Narrative   Regular exercise-no   Caffeine Use-yes     Constitutional: Denies fever, malaise, fatigue,  headache or abrupt weight changes.  HEENT: Denies eye pain, eye redness, ear pain, ringing in the ears, wax buildup, runny nose, nasal congestion, bloody nose, or sore throat. Respiratory: Denies difficulty breathing, shortness of breath, cough or sputum production.   Cardiovascular: Denies chest pain, chest tightness, palpitations or swelling in the hands or feet.  Gastrointestinal: Denies abdominal pain, bloating, constipation, diarrhea or blood in the stool.  GU: Denies urgency, frequency, pain with urination, burning sensation, blood in urine, odor or discharge. Musculoskeletal: Denies decrease in range of motion, difficulty with gait, muscle pain or joint pain and swelling.  Skin: Denies redness, rashes, lesions or ulcercations.  Neurological: Denies dizziness, difficulty with  memory, difficulty with speech or problems with balance and coordination.  Psych: Denies anxiety, depression, SI/HI.  No other specific complaints in a complete review of systems (except as listed in HPI above).  Objective:   Physical Exam  BP 138/82 mmHg  Pulse 55  Temp(Src) 98.2 F (36.8 C) (Oral)  Wt 172 lb 8 oz (78.245 kg)  SpO2 97% Wt Readings from Last 3 Encounters:  06/23/15 172 lb 8 oz (78.245 kg)  01/22/14 170 lb 4 oz (77.225 kg)  10/19/13 172 lb 12.8 oz (78.382 kg)    General: Appears her stated age, obese in NAD. Skin: Warm, dry and intact. No rashes, lesions or ulcerations noted. HEENT: Head: normal shape and size; Eyes: sclera white, no icterus, conjunctiva pink, PERRLA and EOMs intact;  Neck: Neck supple, trachea midline. No masses, lumps or thyromegaly present.  Cardiovascular: Normal rate and rhythm. S1,S2 noted.  No murmur, rubs or gallops noted. No JVD or BLE edema. No carotid bruits noted. Pulmonary/Chest: Normal effort and positive vesicular breath sounds. No respiratory distress. No wheezes, rales or ronchi noted.  Abdomen: Soft and nontender. Normal bowel sounds, no bruits noted. No distention or masses noted. Liver, spleen and kidneys non palpable. Neurological: Alert and oriented.  Psychiatric: Mood and affect normal. Behavior is normal. Judgment and thought content normal.   Hgb A1C Lab Results  Component Value Date   HGBA1C 7.4* 01/02/2014         Assessment & Plan:

## 2015-06-23 NOTE — Progress Notes (Signed)
Pre visit review using our clinic review tool, if applicable. No additional management support is needed unless otherwise documented below in the visit note. 

## 2015-06-23 NOTE — Assessment & Plan Note (Signed)
No issues on Lipitor Will request recent labs from endo Handout given on low fat diet

## 2015-06-23 NOTE — Assessment & Plan Note (Signed)
Continue Janumet as per endo Continue checking fasting sugars Eye exam UTD Flu and Pneumovax UTD- will request immunizations from endo Foot exam today

## 2015-06-23 NOTE — Patient Instructions (Signed)
Fat and Cholesterol Control Diet Fat and cholesterol levels in your blood and organs are influenced by your diet. High levels of fat and cholesterol may lead to diseases of the heart, small and large blood vessels, gallbladder, liver, and pancreas. CONTROLLING FAT AND CHOLESTEROL WITH DIET Although exercise and lifestyle factors are important, your diet is key. That is because certain foods are known to raise cholesterol and others to lower it. The goal is to balance foods for their effect on cholesterol and more importantly, to replace saturated and trans fat with other types of fat, such as monounsaturated fat, polyunsaturated fat, and omega-3 fatty acids. On average, a person should consume no more than 15 to 17 g of saturated fat daily. Saturated and trans fats are considered "bad" fats, and they will raise LDL cholesterol. Saturated fats are primarily found in animal products such as meats, butter, and cream. However, that does not mean you need to give up all your favorite foods. Today, there are good tasting, low-fat, low-cholesterol substitutes for most of the things you like to eat. Choose low-fat or nonfat alternatives. Choose round or loin cuts of red meat. These types of cuts are lowest in fat and cholesterol. Chicken (without the skin), fish, veal, and ground turkey breast are great choices. Eliminate fatty meats, such as hot dogs and salami. Even shellfish have little or no saturated fat. Have a 3 oz (85 g) portion when you eat lean meat, poultry, or fish. Trans fats are also called "partially hydrogenated oils." They are oils that have been scientifically manipulated so that they are solid at room temperature resulting in a longer shelf life and improved taste and texture of foods in which they are added. Trans fats are found in stick margarine, some tub margarines, cookies, crackers, and baked goods.  When baking and cooking, oils are a great substitute for butter. The monounsaturated oils are  especially beneficial since it is believed they lower LDL and raise HDL. The oils you should avoid entirely are saturated tropical oils, such as coconut and palm.  Remember to eat a lot from food groups that are naturally free of saturated and trans fat, including fish, fruit, vegetables, beans, grains (barley, rice, couscous, bulgur wheat), and pasta (without cream sauces).  IDENTIFYING FOODS THAT LOWER FAT AND CHOLESTEROL  Soluble fiber may lower your cholesterol. This type of fiber is found in fruits such as apples, vegetables such as broccoli, potatoes, and carrots, legumes such as beans, peas, and lentils, and grains such as barley. Foods fortified with plant sterols (phytosterol) may also lower cholesterol. You should eat at least 2 g per day of these foods for a cholesterol lowering effect.  Read package labels to identify low-saturated fats, trans fat free, and low-fat foods at the supermarket. Select cheeses that have only 2 to 3 g saturated fat per ounce. Use a heart-healthy tub margarine that is free of trans fats or partially hydrogenated oil. When buying baked goods (cookies, crackers), avoid partially hydrogenated oils. Breads and muffins should be made from whole grains (whole-wheat or whole oat flour, instead of "flour" or "enriched flour"). Buy non-creamy canned soups with reduced salt and no added fats.  FOOD PREPARATION TECHNIQUES  Never deep-fry. If you must fry, either stir-fry, which uses very little fat, or use non-stick cooking sprays. When possible, broil, bake, or roast meats, and steam vegetables. Instead of putting butter or margarine on vegetables, use lemon and herbs, applesauce, and cinnamon (for squash and sweet potatoes). Use nonfat   yogurt, salsa, and low-fat dressings for salads.  LOW-SATURATED FAT / LOW-FAT FOOD SUBSTITUTES Meats / Saturated Fat (g)  Avoid: Steak, marbled (3 oz/85 g) / 11 g  Choose: Steak, lean (3 oz/85 g) / 4 g  Avoid: Hamburger (3 oz/85 g) / 7  g  Choose: Hamburger, lean (3 oz/85 g) / 5 g  Avoid: Ham (3 oz/85 g) / 6 g  Choose: Ham, lean cut (3 oz/85 g) / 2.4 g  Avoid: Chicken, with skin, dark meat (3 oz/85 g) / 4 g  Choose: Chicken, skin removed, dark meat (3 oz/85 g) / 2 g  Avoid: Chicken, with skin, light meat (3 oz/85 g) / 2.5 g  Choose: Chicken, skin removed, light meat (3 oz/85 g) / 1 g Dairy / Saturated Fat (g)  Avoid: Whole milk (1 cup) / 5 g  Choose: Low-fat milk, 2% (1 cup) / 3 g  Choose: Low-fat milk, 1% (1 cup) / 1.5 g  Choose: Skim milk (1 cup) / 0.3 g  Avoid: Hard cheese (1 oz/28 g) / 6 g  Choose: Skim milk cheese (1 oz/28 g) / 2 to 3 g  Avoid: Cottage cheese, 4% fat (1 cup) / 6.5 g  Choose: Low-fat cottage cheese, 1% fat (1 cup) / 1.5 g  Avoid: Ice cream (1 cup) / 9 g  Choose: Sherbet (1 cup) / 2.5 g  Choose: Nonfat frozen yogurt (1 cup) / 0.3 g  Choose: Frozen fruit bar / trace  Avoid: Whipped cream (1 tbs) / 3.5 g  Choose: Nondairy whipped topping (1 tbs) / 1 g Condiments / Saturated Fat (g)  Avoid: Mayonnaise (1 tbs) / 2 g  Choose: Low-fat mayonnaise (1 tbs) / 1 g  Avoid: Butter (1 tbs) / 7 g  Choose: Extra light margarine (1 tbs) / 1 g  Avoid: Coconut oil (1 tbs) / 11.8 g  Choose: Olive oil (1 tbs) / 1.8 g  Choose: Corn oil (1 tbs) / 1.7 g  Choose: Safflower oil (1 tbs) / 1.2 g  Choose: Sunflower oil (1 tbs) / 1.4 g  Choose: Soybean oil (1 tbs) / 2.4 g  Choose: Canola oil (1 tbs) / 1 g Document Released: 12/13/2005 Document Revised: 04/09/2013 Document Reviewed: 03/13/2014 Exit[employer] Patient Information 2015 [employer]. This information is not intended to replace advice given to you by your health care provider. Make sure you discuss any questions you have with your health care provider.  

## 2015-06-26 DIAGNOSIS — J0101 Acute recurrent maxillary sinusitis: Secondary | ICD-10-CM | POA: Diagnosis not present

## 2015-06-26 DIAGNOSIS — E119 Type 2 diabetes mellitus without complications: Secondary | ICD-10-CM | POA: Diagnosis not present

## 2015-06-26 DIAGNOSIS — E039 Hypothyroidism, unspecified: Secondary | ICD-10-CM | POA: Diagnosis not present

## 2015-09-23 ENCOUNTER — Other Ambulatory Visit: Payer: Self-pay | Admitting: Internal Medicine

## 2015-09-23 DIAGNOSIS — R109 Unspecified abdominal pain: Secondary | ICD-10-CM | POA: Diagnosis not present

## 2015-09-23 DIAGNOSIS — Z1231 Encounter for screening mammogram for malignant neoplasm of breast: Secondary | ICD-10-CM

## 2015-09-23 DIAGNOSIS — E2839 Other primary ovarian failure: Secondary | ICD-10-CM

## 2015-09-23 DIAGNOSIS — E119 Type 2 diabetes mellitus without complications: Secondary | ICD-10-CM | POA: Diagnosis not present

## 2015-09-23 DIAGNOSIS — Z23 Encounter for immunization: Secondary | ICD-10-CM | POA: Diagnosis not present

## 2015-09-23 DIAGNOSIS — I10 Essential (primary) hypertension: Secondary | ICD-10-CM | POA: Diagnosis not present

## 2015-09-23 DIAGNOSIS — E039 Hypothyroidism, unspecified: Secondary | ICD-10-CM | POA: Diagnosis not present

## 2015-09-23 DIAGNOSIS — R1011 Right upper quadrant pain: Secondary | ICD-10-CM | POA: Diagnosis not present

## 2015-10-23 ENCOUNTER — Other Ambulatory Visit: Payer: Self-pay | Admitting: Internal Medicine

## 2015-10-23 DIAGNOSIS — R748 Abnormal levels of other serum enzymes: Secondary | ICD-10-CM

## 2015-10-23 DIAGNOSIS — E119 Type 2 diabetes mellitus without complications: Secondary | ICD-10-CM | POA: Diagnosis not present

## 2015-10-23 DIAGNOSIS — E039 Hypothyroidism, unspecified: Secondary | ICD-10-CM | POA: Diagnosis not present

## 2015-10-23 DIAGNOSIS — R109 Unspecified abdominal pain: Secondary | ICD-10-CM

## 2015-10-23 DIAGNOSIS — R1012 Left upper quadrant pain: Secondary | ICD-10-CM | POA: Diagnosis not present

## 2015-10-27 ENCOUNTER — Ambulatory Visit (HOSPITAL_COMMUNITY)
Admission: RE | Admit: 2015-10-27 | Discharge: 2015-10-27 | Disposition: A | Discharge status: Home / Self Care | Payer: Medicare Other | Source: Ambulatory Visit | Attending: Internal Medicine | Admitting: Internal Medicine

## 2015-10-27 DIAGNOSIS — R1013 Epigastric pain: Secondary | ICD-10-CM | POA: Diagnosis not present

## 2015-10-27 DIAGNOSIS — K259 Gastric ulcer, unspecified as acute or chronic, without hemorrhage or perforation: Secondary | ICD-10-CM | POA: Diagnosis not present

## 2015-10-27 DIAGNOSIS — R109 Unspecified abdominal pain: Secondary | ICD-10-CM

## 2015-10-27 DIAGNOSIS — R748 Abnormal levels of other serum enzymes: Secondary | ICD-10-CM | POA: Diagnosis not present

## 2015-10-27 DIAGNOSIS — K7689 Other specified diseases of liver: Secondary | ICD-10-CM | POA: Insufficient documentation

## 2015-10-27 DIAGNOSIS — N200 Calculus of kidney: Secondary | ICD-10-CM | POA: Diagnosis not present

## 2015-10-27 DIAGNOSIS — N2889 Other specified disorders of kidney and ureter: Secondary | ICD-10-CM | POA: Insufficient documentation

## 2015-10-29 DIAGNOSIS — E119 Type 2 diabetes mellitus without complications: Secondary | ICD-10-CM | POA: Diagnosis not present

## 2015-10-30 ENCOUNTER — Other Ambulatory Visit: Payer: Self-pay | Admitting: Internal Medicine

## 2015-10-30 DIAGNOSIS — N281 Cyst of kidney, acquired: Secondary | ICD-10-CM

## 2015-10-30 DIAGNOSIS — R9389 Abnormal findings on diagnostic imaging of other specified body structures: Secondary | ICD-10-CM

## 2015-11-10 ENCOUNTER — Ambulatory Visit (HOSPITAL_COMMUNITY): Admission: RE | Admit: 2015-11-10 | Payer: Medicare Other | Source: Ambulatory Visit

## 2015-11-13 ENCOUNTER — Other Ambulatory Visit: Payer: Self-pay | Admitting: Internal Medicine

## 2015-11-18 ENCOUNTER — Ambulatory Visit (HOSPITAL_COMMUNITY): Payer: Medicare Other

## 2015-12-23 ENCOUNTER — Ambulatory Visit (INDEPENDENT_AMBULATORY_CARE_PROVIDER_SITE_OTHER): Payer: Medicare Other | Admitting: Internal Medicine

## 2015-12-23 ENCOUNTER — Encounter: Payer: Self-pay | Admitting: Internal Medicine

## 2015-12-23 VITALS — BP 132/82 | HR 67 | Temp 97.6°F | Wt 178.0 lb

## 2015-12-23 DIAGNOSIS — I10 Essential (primary) hypertension: Secondary | ICD-10-CM | POA: Diagnosis not present

## 2015-12-23 DIAGNOSIS — N3281 Overactive bladder: Secondary | ICD-10-CM

## 2015-12-23 DIAGNOSIS — K219 Gastro-esophageal reflux disease without esophagitis: Secondary | ICD-10-CM

## 2015-12-23 DIAGNOSIS — E119 Type 2 diabetes mellitus without complications: Secondary | ICD-10-CM | POA: Diagnosis not present

## 2015-12-23 DIAGNOSIS — E039 Hypothyroidism, unspecified: Secondary | ICD-10-CM

## 2015-12-23 DIAGNOSIS — E785 Hyperlipidemia, unspecified: Secondary | ICD-10-CM

## 2015-12-23 DIAGNOSIS — M109 Gout, unspecified: Secondary | ICD-10-CM

## 2015-12-23 LAB — CBC
HEMATOCRIT: 44.1 % (ref 36.0–46.0)
Hemoglobin: 14.4 g/dL (ref 12.0–15.0)
MCHC: 32.8 g/dL (ref 30.0–36.0)
MCV: 93.7 fl (ref 78.0–100.0)
Platelets: 238 10*3/uL (ref 150.0–400.0)
RBC: 4.7 Mil/uL (ref 3.87–5.11)
RDW: 13.2 % (ref 11.5–15.5)
WBC: 6.4 10*3/uL (ref 4.0–10.5)

## 2015-12-23 LAB — COMPREHENSIVE METABOLIC PANEL
ALT: 38 U/L — ABNORMAL HIGH (ref 0–35)
AST: 24 U/L (ref 0–37)
Albumin: 4.4 g/dL (ref 3.5–5.2)
Alkaline Phosphatase: 83 U/L (ref 39–117)
BUN: 24 mg/dL — ABNORMAL HIGH (ref 6–23)
CALCIUM: 9.8 mg/dL (ref 8.4–10.5)
CHLORIDE: 102 meq/L (ref 96–112)
CO2: 25 meq/L (ref 19–32)
CREATININE: 1.01 mg/dL (ref 0.40–1.20)
GFR: 57.65 mL/min — ABNORMAL LOW (ref >60.00)
Glucose, Bld: 146 mg/dL — ABNORMAL HIGH (ref 70–99)
POTASSIUM: 4.1 meq/L (ref 3.5–5.1)
Sodium: 138 mEq/L (ref 135–145)
Total Bilirubin: 0.6 mg/dL (ref 0.2–1.2)
Total Protein: 6.9 g/dL (ref 6.0–8.3)

## 2015-12-23 LAB — LIPID PANEL
CHOLESTEROL: 168 mg/dL (ref 0–200)
HDL: 45.2 mg/dL (ref >39.00)
NonHDL: 122.65
TRIGLYCERIDES: 220 mg/dL — AB (ref 0.0–149.0)
Total CHOL/HDL Ratio: 4
VLDL: 44 mg/dL — AB (ref 0.0–40.0)

## 2015-12-23 LAB — MICROALBUMIN / CREATININE URINE RATIO
Creatinine,U: 76.7 mg/dL
Microalb Creat Ratio: 1 mg/g (ref 0.0–30.0)
Microalb, Ur: 0.8 mg/dL (ref 0.0–1.9)

## 2015-12-23 LAB — LDL CHOLESTEROL, DIRECT: Direct LDL: 94 mg/dL

## 2015-12-23 LAB — TSH: TSH: 2.65 u[IU]/mL (ref 0.35–4.50)

## 2015-12-23 LAB — HEMOGLOBIN A1C: HEMOGLOBIN A1C: 6.9 % — AB (ref 4.6–6.5)

## 2015-12-23 LAB — T4, FREE: FREE T4: 0.97 ng/dL (ref 0.60–1.60)

## 2015-12-23 MED ORDER — NYSTATIN 100000 UNIT/GM EX CREA: 1.0000 "application " | TOPICAL_CREAM | Freq: Two times a day (BID) | CUTANEOUS | Status: DC

## 2015-12-23 NOTE — Assessment & Plan Note (Signed)
>>  ASSESSMENT AND PLAN FOR HYPERLIPIDEMIA ASSOCIATED WITH TYPE 2 DIABETES MELLITUS ([facility]CC) WRITTEN ON 12/23/2015 11:06 AM BY BAITY, REGINA W, NP  Will check CMET and Lipid Profile today Encouraged her to consume a low fat diet Continue Lipitor Advised her to start baby ASA daily

## 2015-12-23 NOTE — Progress Notes (Signed)
Pre visit review using our clinic review tool, if applicable. No additional management support is needed unless otherwise documented below in the visit note. 

## 2015-12-23 NOTE — Assessment & Plan Note (Signed)
Continue Trimpex

## 2015-12-23 NOTE — Progress Notes (Signed)
Subjective:    Patient ID: Jill Daugherty, female    DOB: [birth year], 69 y.o.   MRN: [id number]  HPI  Pt presents to the clinic today for 6 month follow up of chronic conditions.  Gout: She reports she has not had a flare recently. She takes Allopurinol daily as prescribed. She tries to avoid red meat, alcohol and foods containing purines.  HTN: Her BP is well controlled on Norvasc, Atenolol and HCTZ. Her BP today is 132/82.  HLD: She denies myalgias on Lipitor. She does try to consume a low fat diet.  Hypothyroidism: She denies s/s of hypothyroidism on current dose of Synthroid. She reports she had all of her labs checked 1 week ago by Dr. Carlis Abbott.  DM2: She sees Dr. Carlis Abbott. SHer last A1C was 6.4%. She reports he took her off her Janumet and advised her not to take it unless her sugar is > 135. Her fasting sugars range 142-172. Eye exam 09/2015. She reports her flu and pneumonia vaccines are UTD, given by Dr. Carlis Abbott. She denies numbness and tingling in her hands or feet.  GERD: She reports Dr. Carlis Abbott took her off Prilosec because of the risk for MI, and put her on Pantoprazole. She denies breakthrough symptoms. She does occasionally have episodes of fecal urgency where she can not make it to the bathroom. She denies associated abdominal pain. She is having the issue this morning. She reports she ate eggs, bacon and toast for breakfast.  OAB with frequent UTI's: She reports her symptoms are well controlled on Trimprex. She voids less frequently and has not had any recent infections.   Review of Systems      Past Medical History  Diagnosis Date  . History of chicken pox   . Hypertension   . Hyperlipidemia   . Thyroid disease   . Overactive bladder   . History of frequent urinary tract infections     Current Outpatient Prescriptions  Medication Sig Dispense Refill  . allopurinol (ZYLOPRIM) 100 MG tablet Take 1 tablet by mouth daily.    Marland Kitchen amLODipine (NORVASC) 5 MG tablet Take 1  tablet by mouth daily.    Marland Kitchen atenolol (TENORMIN) 100 MG tablet Take 100 mg by mouth daily.     Marland Kitchen atorvastatin (LIPITOR) 20 MG tablet Take 20 mg by mouth daily.     . hydrochlorothiazide (MICROZIDE) 12.5 MG capsule Take 12.5 mg by mouth daily.     Marland Kitchen levothyroxine (SYNTHROID, LEVOTHROID) 75 MCG tablet Take 75 mcg by mouth daily before breakfast.    . nystatin cream (MYCOSTATIN) Apply 1 application topically 2 (two) times daily. 30 g 0  . sitaGLIPtin-metformin (JANUMET) 50-1000 MG per tablet Take 1 tablet by mouth daily as needed.    . trimethoprim (TRIMPEX) 100 MG tablet Take 1 tablet (100 mg total) by mouth daily. 30 tablet 6   No current facility-administered medications for this visit.    No Known Allergies  Family History  Problem Relation Age of Onset  . Heart disease Father   . Diabetes Paternal Grandmother   . Cancer Paternal Grandmother     Social History   Social History  . Marital Status: Married    Spouse Name: N/A  . Number of Children: 1  . Years of Education: 12   Occupational History  . Retired    Social History Main Topics  . Smoking status: Never Smoker   . Smokeless tobacco: Never Used  . Alcohol Use: No  . Drug Use:  No  . Sexual Activity: Not Currently   Other Topics Concern  . Not on file   Social History Narrative   Regular exercise-no   Caffeine Use-yes     Constitutional: Denies fever, malaise, fatigue, headache or abrupt weight changes.  HEENT: Denies eye pain, eye redness, ear pain, ringing in the ears, wax buildup, runny nose, nasal congestion, bloody nose, or sore throat. Respiratory: Denies difficulty breathing, shortness of breath, cough or sputum production.   Cardiovascular: Denies chest pain, chest tightness, palpitations or swelling in the hands or feet.  Gastrointestinal: Pt reports diarrhea. Denies abdominal pain, bloating, constipation, or blood in the stool.  GU: Denies urgency, frequency, pain with urination, burning sensation,  blood in urine, odor or discharge. Musculoskeletal: Denies decrease in range of motion, difficulty with gait, muscle pain or joint pain and swelling.  Skin: Denies redness, rashes, lesions or ulcercations.  Neurological: Denies dizziness, difficulty with memory, difficulty with speech or problems with balance and coordination.  Psych: Denies anxiety, depression, SI/HI.  No other specific complaints in a complete review of systems (except as listed in HPI above).  Objective:   Physical Exam  BP 132/82 mmHg  Pulse 67  Temp(Src) 97.6 F (36.4 C) (Oral)  Wt 178 lb (80.74 kg)  SpO2 98% Wt Readings from Last 3 Encounters:  12/23/15 178 lb (80.74 kg)  06/23/15 172 lb 8 oz (78.245 kg)  01/22/14 170 lb 4 oz (77.225 kg)    General: Appears her stated age, obese in NAD. Skin: Warm, dry and intact. No rashes, lesions or ulcerations noted. HEENT: Head: normal shape and size; Eyes: sclera white, no icterus, conjunctiva pink, PERRLA and EOMs intact;  Neck: Neck supple, trachea midline. No masses, lumps or thyromegaly present.  Cardiovascular: Normal rate and rhythm. S1,S2 noted.  No murmur, rubs or gallops noted. No JVD or BLE edema. No carotid bruits noted. Pulmonary/Chest: Normal effort and positive vesicular breath sounds. No respiratory distress. No wheezes, rales or ronchi noted.  Abdomen: Soft and nontender. Normal bowel sounds. No distention or masses noted. Liver, spleen and kidneys non palpable. Neurological: Alert and oriented.  Psychiatric: Mood and affect normal. Behavior is normal. Judgment and thought content normal.   Hgb A1C Lab Results  Component Value Date   HGBA1C 7.4* 01/02/2014         Assessment & Plan:

## 2015-12-23 NOTE — Assessment & Plan Note (Signed)
Will check CMET and Lipid Profile today Encouraged her to consume a low fat diet Continue Lipitor Advised her to start baby ASA daily

## 2015-12-23 NOTE — Assessment & Plan Note (Signed)
Will check A1C and microalbumin today Encouraged her to consume a low fat, low carb diet Will get copy of diabetic eye exam and immunizations for Dr. Carlis Abbott I think she needs to be on daily medication given fasting sugars, but will defer to Dr. Freda Munro exam today

## 2015-12-23 NOTE — Assessment & Plan Note (Signed)
Well controlled on current meds CBC and CMET today ECG today, old infarct, no acute changes

## 2015-12-23 NOTE — Patient Instructions (Signed)

## 2015-12-23 NOTE — Assessment & Plan Note (Signed)
Symptoms well controlled on Pantoprazole She is having diarrhea, ? IBS Advised her to keep of record of diarrhea and foods she eats to see if there is a correlation

## 2015-12-23 NOTE — Assessment & Plan Note (Signed)
>>  ASSESSMENT AND PLAN FOR DM2 (DIABETES MELLITUS, TYPE 2) (HCC) WRITTEN ON 12/23/2015 11:04 AM BY BAITY, REGINA W, NP  Will check A1C and microalbumin today Encouraged her to consume a low fat, low carb diet Will get copy of diabetic eye exam and immunizations for Dr. Chestine Spore I think she needs to be on daily medication given fasting sugars, but will defer to Dr. Brion Aliment exam today

## 2015-12-23 NOTE — Assessment & Plan Note (Signed)
Continue Allopurinol Uric acid level today

## 2015-12-23 NOTE — Assessment & Plan Note (Signed)
Will check TSH and T4 today Will adjust, refill Synthroid based on labs

## 2016-01-06 DIAGNOSIS — J0101 Acute recurrent maxillary sinusitis: Secondary | ICD-10-CM | POA: Diagnosis not present

## 2016-01-06 DIAGNOSIS — E119 Type 2 diabetes mellitus without complications: Secondary | ICD-10-CM | POA: Diagnosis not present

## 2016-01-06 DIAGNOSIS — E039 Hypothyroidism, unspecified: Secondary | ICD-10-CM | POA: Diagnosis not present

## 2016-01-06 DIAGNOSIS — I10 Essential (primary) hypertension: Secondary | ICD-10-CM | POA: Diagnosis not present

## 2016-02-03 IMAGING — US US ABDOMEN COMPLETE
1 series · 15 of 25 positions shown · non-contrast
Comparison: None.

CLINICAL DATA: Abdominal discomfort, elevated lipase

EXAM:
ULTRASOUND ABDOMEN COMPLETE

[Series 1: us abdomen complete · 15 of 119 slices shown]
[im 1/119]
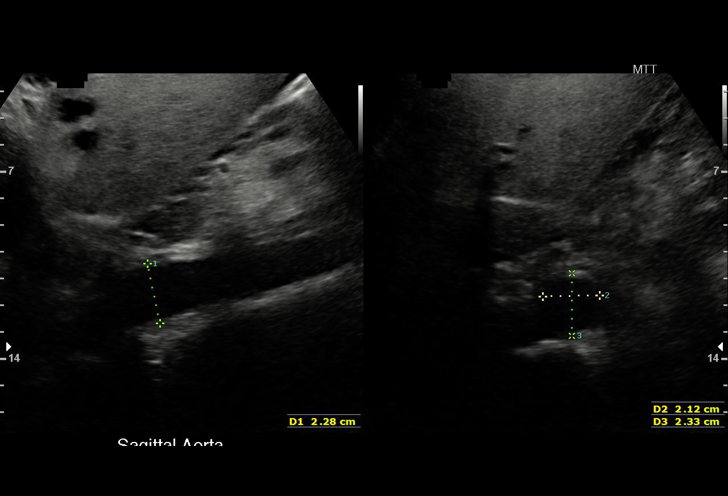
[im 10/119]
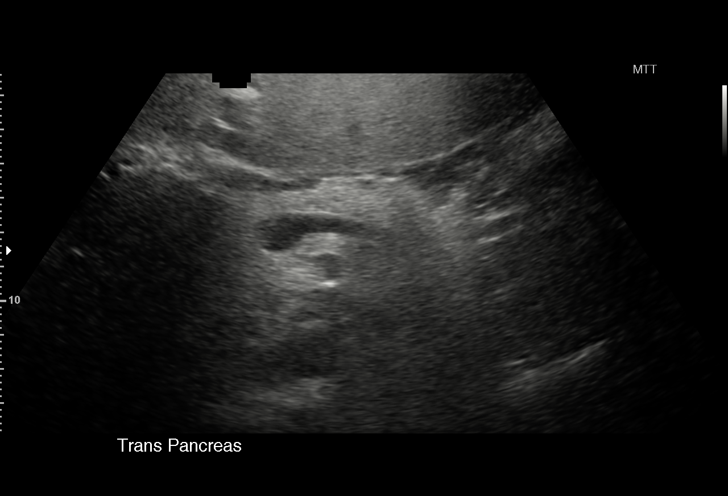
[im 20/119]
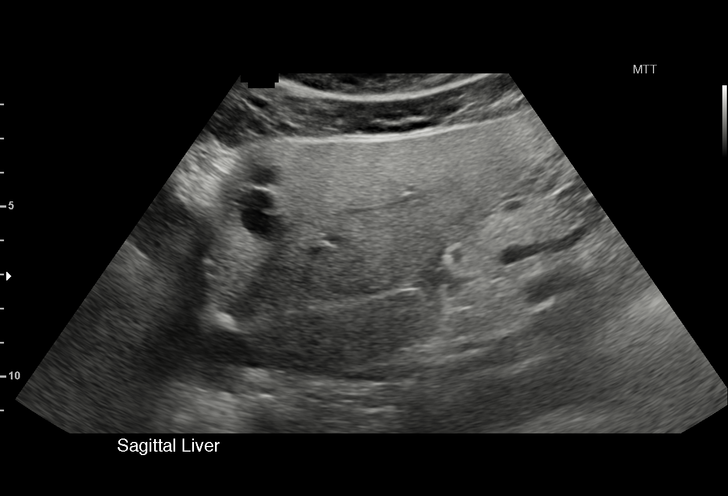
[im 25/119]
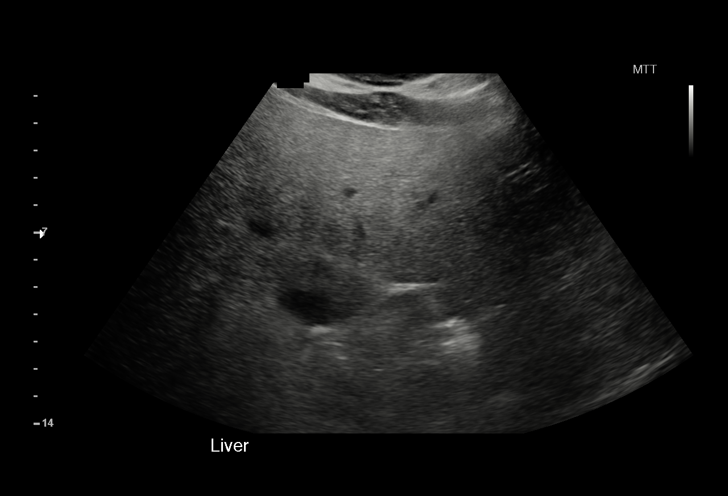
[im 35/119]
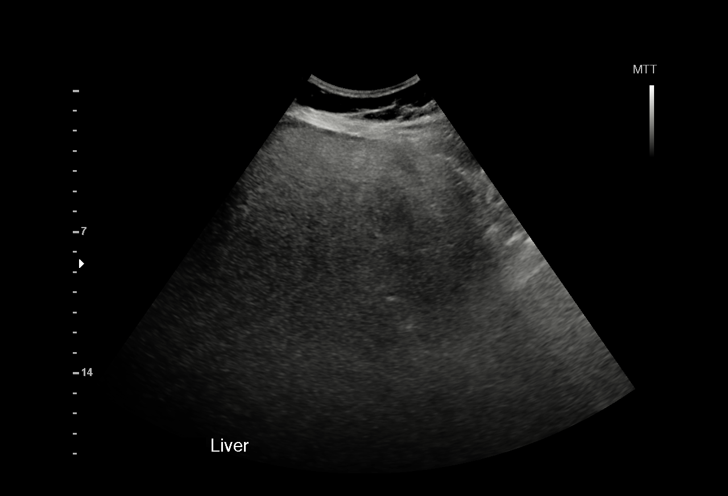
[im 45/119]
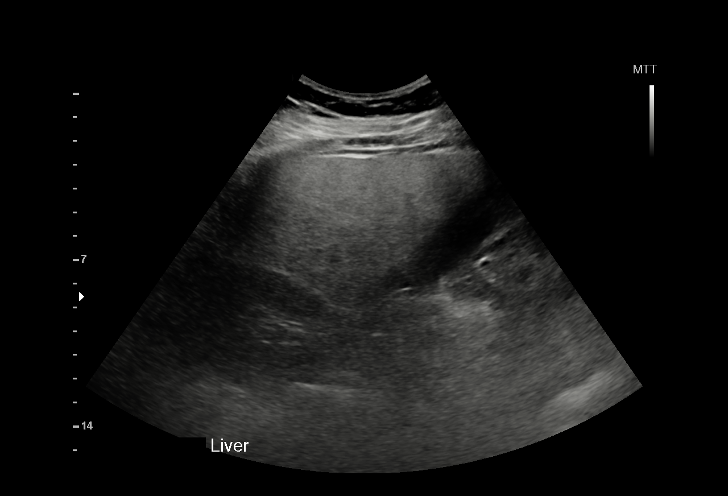
[im 50/119]
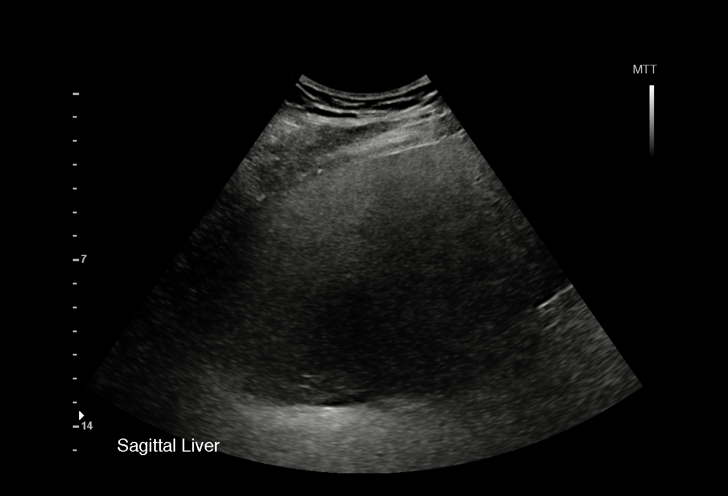
[im 60/119]
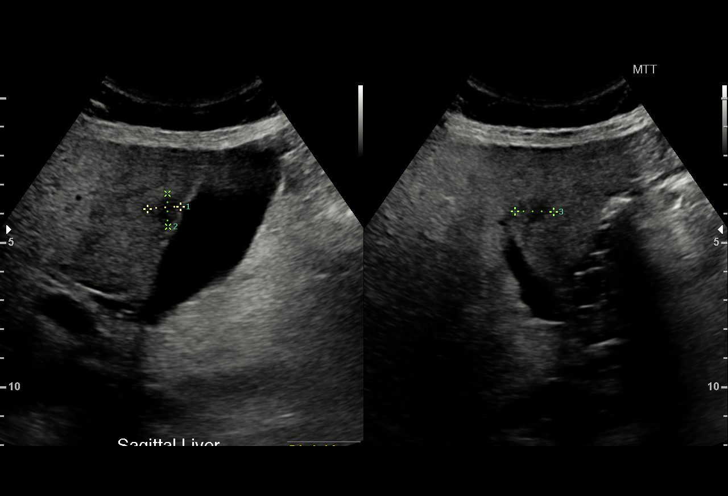
[im 69/119]
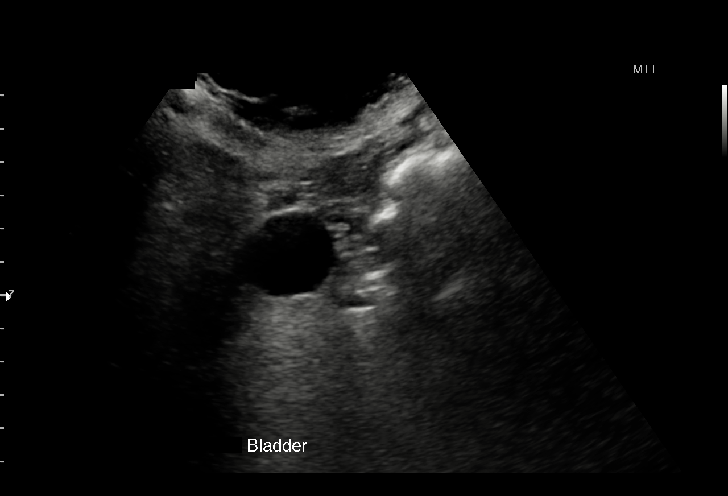
[im 74/119]
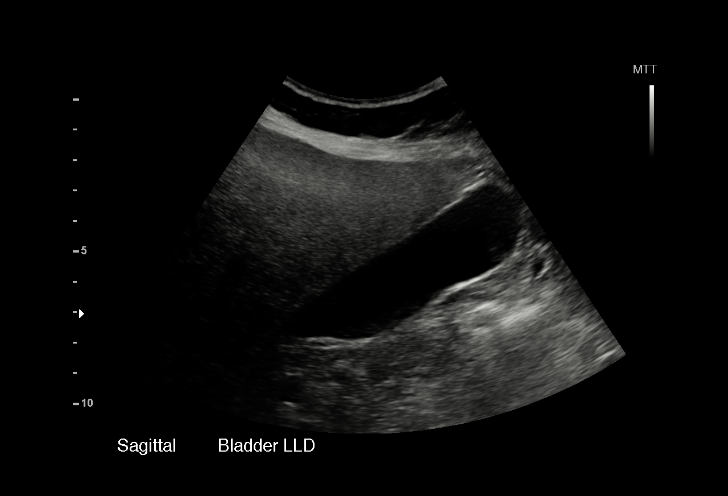
[im 84/119]
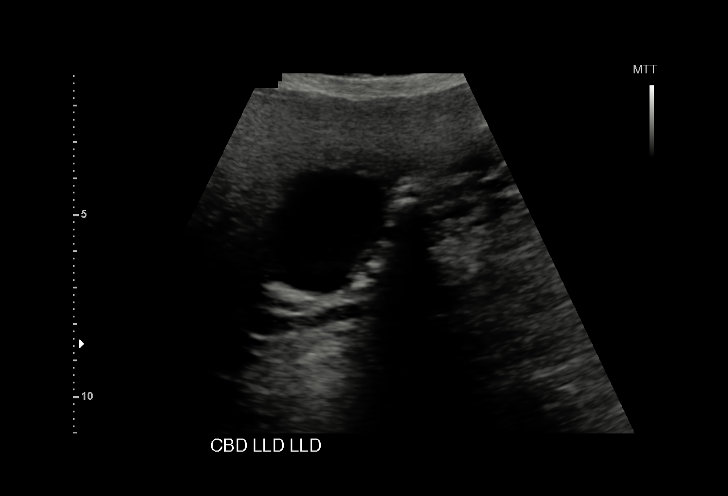
[im 94/119]
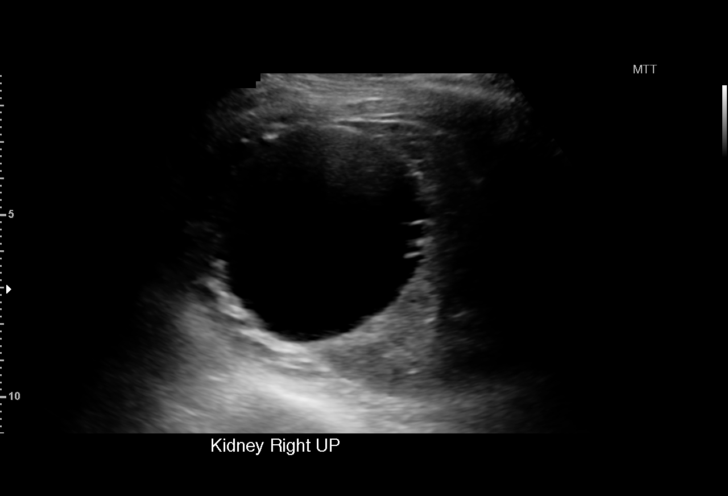
[im 99/119]
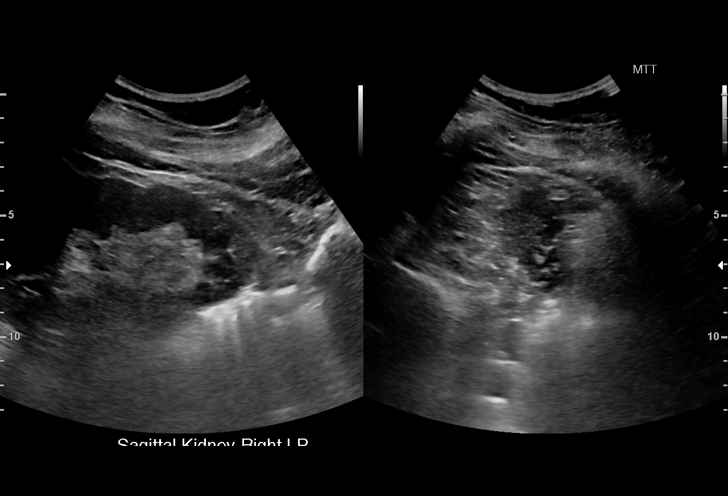
[im 109/119]
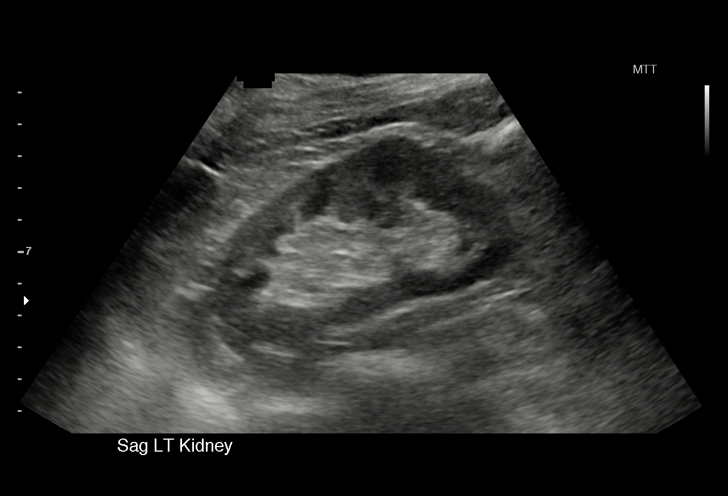
[im 119/119]
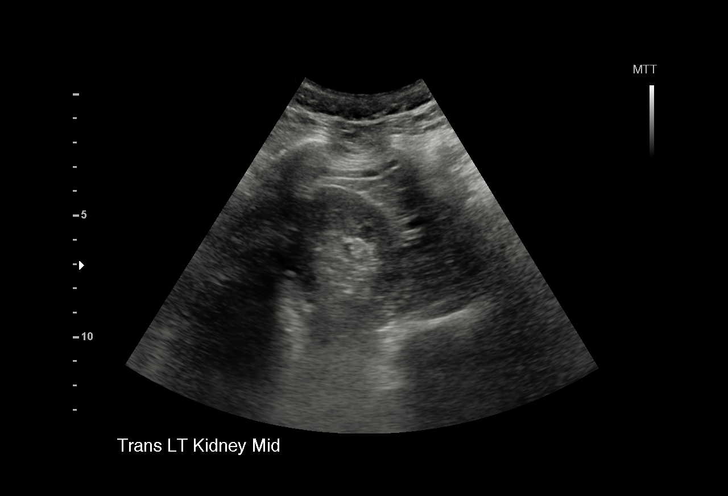

[15 of 25 positions shown; findings below may reference images not displayed]

FINDINGS: Gallbladder: No gallstones or wall thickening visualized. No
sonographic Murphy sign noted.

Common bile duct: Diameter: 6.1 mm

Liver: 2 hypoechoic masses in the left hepatic lobe measuring 1.9 x
2.7 x 2.6 cm and 9 x 8 x 10 mm respectively with mild irregular
margins likely representing mildly complicated cysts. Relative
hypodensity adjacent to the gallbladder fossa likely reflecting
focal fatty sparing. Increased hepatic parenchymal echogenicity.

IVC: No abnormality visualized.

Pancreas: Limited visualization secondary to overlying bowel gas.

Spleen: Size and appearance within normal limits.

Right Kidney: Length: 13.5 cm. 6.4 x 6.1 x 5.9 cm cystic mass in the
upper pole of the right kidney with a 1 cm echogenic mural nodule.
Echogenicity within normal limits. No hydronephrosis visualized.

Left Kidney: Length: 9.8 cm. 2.3 x 2 x 1.7 cm macro lobulated left
midpole renal mass with increased through transmission with a thin
internal septation likely representing a mildly complicated cyst.
Echogenicity within normal limits. No hydronephrosis visualized.

Abdominal aorta: No aneurysm visualized.

Other findings: None.
IMPRESSION: 1. No cholelithiasis or sonographic evidence of acute cholecystitis.
2. Limited visualization of the pancreas secondary to overlying
bowel gas.
3. Complex cystic right upper pole renal mass measuring 6.4 x 6.1 x
5.9 cm with a 1 cm echogenic mural nodule. Recommend further
evaluation with a nonemergent multiphasic MRI or CT of the abdomen.
4. Mildly complicated left hepatic cysts.
5. Echogenic liver most compatible with hepatic steatosis.

## 2016-02-16 DIAGNOSIS — Z0001 Encounter for general adult medical examination with abnormal findings: Secondary | ICD-10-CM | POA: Diagnosis not present

## 2016-04-07 ENCOUNTER — Other Ambulatory Visit: Payer: Self-pay | Admitting: Internal Medicine

## 2016-04-08 NOTE — Telephone Encounter (Signed)
Pt has a CPE scheduled on 06/22/16, trimethoprim last refilled on 06/23/15 #30 with 6 additional refills, and the nystatin was last refilled on 12/23/15 #30g with 2 additional refills, please advise

## 2016-04-08 NOTE — Telephone Encounter (Signed)
Sent electronically 

## 2016-05-31 DIAGNOSIS — I10 Essential (primary) hypertension: Secondary | ICD-10-CM | POA: Diagnosis not present

## 2016-05-31 DIAGNOSIS — E119 Type 2 diabetes mellitus without complications: Secondary | ICD-10-CM | POA: Diagnosis not present

## 2016-05-31 DIAGNOSIS — E039 Hypothyroidism, unspecified: Secondary | ICD-10-CM | POA: Diagnosis not present

## 2016-06-22 ENCOUNTER — Encounter: Payer: Self-pay | Admitting: Internal Medicine

## 2016-06-22 ENCOUNTER — Ambulatory Visit (INDEPENDENT_AMBULATORY_CARE_PROVIDER_SITE_OTHER): Payer: Medicare Other | Admitting: Internal Medicine

## 2016-06-22 ENCOUNTER — Encounter: Payer: Medicare Other | Admitting: Internal Medicine

## 2016-06-22 VITALS — BP 138/78 | HR 63 | Temp 97.5°F | Ht 63.5 in | Wt 174.8 lb

## 2016-06-22 DIAGNOSIS — E039 Hypothyroidism, unspecified: Secondary | ICD-10-CM | POA: Diagnosis not present

## 2016-06-22 DIAGNOSIS — Z0000 Encounter for general adult medical examination without abnormal findings: Secondary | ICD-10-CM

## 2016-06-22 DIAGNOSIS — K219 Gastro-esophageal reflux disease without esophagitis: Secondary | ICD-10-CM | POA: Diagnosis not present

## 2016-06-22 DIAGNOSIS — E1165 Type 2 diabetes mellitus with hyperglycemia: Secondary | ICD-10-CM

## 2016-06-22 DIAGNOSIS — M109 Gout, unspecified: Secondary | ICD-10-CM

## 2016-06-22 DIAGNOSIS — I10 Essential (primary) hypertension: Secondary | ICD-10-CM | POA: Diagnosis not present

## 2016-06-22 DIAGNOSIS — N3281 Overactive bladder: Secondary | ICD-10-CM

## 2016-06-22 DIAGNOSIS — E785 Hyperlipidemia, unspecified: Secondary | ICD-10-CM

## 2016-06-22 MED ORDER — TRIMETHOPRIM 100 MG PO TABS: ORAL_TABLET | ORAL | Status: DC

## 2016-06-22 NOTE — Assessment & Plan Note (Signed)
Controlled on Atenolol, Norvasc and HCTZ

## 2016-06-22 NOTE — Assessment & Plan Note (Signed)
Uncontrolled Discussed low carb diet and weight loss Continue Janumet She reports her pneumonia vaccines are UTD Flu shot UTD Encouraged yearly eye exams Foot exam today

## 2016-06-22 NOTE — Assessment & Plan Note (Signed)
Continue Trimpex Refilled today Discussed Kegel exercises

## 2016-06-22 NOTE — Progress Notes (Signed)
HPI:  Pt presents to the clinic today for her Medicare Wellness Exam. She is also due for follow up of chronic conditions.  Gout: She reports she has not had a flare recently. She takes Allopurinol daily as prescribed. She tries to avoid red meat, alcohol and foods containing purines.  HTN: Her BP is well controlled on Norvasc, Atenolol and HCTZ. Her BP today is 138/78. ECG from 11/2015 reviewed.  HLD: She denies myalgias on Lipitor. She does try to consume a low fat diet.  Hypothyroidism: She denies s/s of hypothyroidism on current dose of Synthroid. Shehad her thyroid hormones checked 05/2016.  DM2: She sees Dr. Carlis Abbott. Her last A1C was 8.5%. She reports he put her back on Janumet. Her fasting sugars range 119-181. Eye exam 09/2015. She reports her flu and pneumonia vaccines are UTD, given by Dr. Carlis Abbott. She denies numbness and tingling in her hands or feet.  GERD: She reports Dr. Carlis Abbott took her off Prilosec because of the risk for MI, and put her on Zantac OTC. She denies breakthrough symptoms.   OAB with frequent UTI's: She reports her symptoms are well controlled on Trimprex. She voids less frequently and has not had any recent infections.    Past Medical History  Diagnosis Date  . History of chicken pox   . Hypertension   . Hyperlipidemia   . Thyroid disease   . Overactive bladder   . History of frequent urinary tract infections     Current Outpatient Prescriptions  Medication Sig Dispense Refill  . allopurinol (ZYLOPRIM) 100 MG tablet Take 1 tablet by mouth daily.    Marland Kitchen amLODipine (NORVASC) 5 MG tablet Take 1 tablet by mouth daily.    Marland Kitchen atenolol (TENORMIN) 100 MG tablet Take 100 mg by mouth daily.     Marland Kitchen atorvastatin (LIPITOR) 20 MG tablet Take 20 mg by mouth daily.     . hydrochlorothiazide (MICROZIDE) 12.5 MG capsule Take 12.5 mg by mouth daily.     Marland Kitchen levothyroxine (SYNTHROID, LEVOTHROID) 75 MCG tablet Take 75 mcg by mouth daily before breakfast.    . nystatin cream  (MYCOSTATIN) Apply 1 application topically 2 (two) times daily. 30 g 2  . nystatin cream (MYCOSTATIN) APPLY  CREAM TOPICALLY TO AFFECTED AREA TWICE DAILY 30 g 0  . sitaGLIPtin-metformin (JANUMET) 50-1000 MG per tablet Take 1 tablet by mouth daily as needed.    . trimethoprim (TRIMPEX) 100 MG tablet TAKE ONE TABLET BY MOUTH ONCE DAILY (NEEDS  OFFICE  VISIT) 30 tablet 0   No current facility-administered medications for this visit.    No Known Allergies  Family History  Problem Relation Age of Onset  . Heart disease Father   . Diabetes Paternal Grandmother   . Cancer Paternal Grandmother     Social History   Social History  . Marital Status: Married    Spouse Name: N/A  . Number of Children: 1  . Years of Education: 12   Occupational History  . Retired    Social History Main Topics  . Smoking status: Never Smoker   . Smokeless tobacco: Never Used  . Alcohol Use: No  . Drug Use: No  . Sexual Activity: Not Currently   Other Topics Concern  . Not on file   Social History Narrative   Regular exercise-no   Caffeine Use-yes    Hospitiliaztions: None  Health Maintenance:    Flu: 09/2015  Tetanus: 2014  Pneumovax: not sure  Prevnar: not sure  Zostavax: not sure  Mammogram: 11/2013  Pap Smear: 06/2012  Bone Density: 11/2013  Colon Screening: > 10 years ago  Eye Doctor: yearly  Dental Exam: biannually   Providers:   PCP: Webb Silversmith, NP-C  Endocrinologist: Dr. Carlis Abbott     I have personally reviewed and have noted:  1. The patient's medical and social history 2. Their use of alcohol, tobacco or illicit drugs 3. Their current medications and supplements 4. The patient's functional ability including ADL's, fall risks,  home safety risks and hearing or visual impairment. 5. Diet and physical activities 6. Evidence for depression or mood disorder  Subjective:   Review of Systems:   Constitutional: Denies fever, malaise, fatigue, headache or abrupt weight  changes.  HEENT: Denies eye pain, eye redness, ear pain, ringing in the ears, wax buildup, runny nose, nasal congestion, bloody nose, or sore throat. Respiratory: Denies difficulty breathing, shortness of breath, cough or sputum production.   Cardiovascular: Denies chest pain, chest tightness, palpitations or swelling in the hands or feet.  Gastrointestinal: Pt reports occasional reflux and loose stool. Denies abdominal pain, bloating, constipation, diarrhea or blood in the stool.  GU: Pt reports urinary incontinence. Denies urgency, frequency, pain with urination, burning sensation, blood in urine, odor or discharge. Musculoskeletal: Denies decrease in range of motion, difficulty with gait, muscle pain or joint pain and swelling.  Skin: Denies redness, rashes, lesions or ulcercations.  Neurological: Denies dizziness, difficulty with memory, difficulty with speech or problems with balance and coordination.  Psych: Denies anxiety, depression, SI/HI.  No other specific complaints in a complete review of systems (except as listed in HPI above).  Objective:  PE:   BP 138/78 mmHg  Pulse 63  Temp(Src) 97.5 F (36.4 C) (Oral)  Ht 5' 3.5" (1.613 m)  Wt 174 lb 12 oz (79.266 kg)  BMI 30.47 kg/m2  SpO2 95%  Wt Readings from Last 3 Encounters:  12/23/15 178 lb (80.74 kg)  06/23/15 172 lb 8 oz (78.245 kg)  01/22/14 170 lb 4 oz (77.225 kg)    General: Appears her stated age, obese in NAD. Skin: Warm, dry and intact.  HEENT: Head: normal shape and size; Eyes: sclera white, no icterus, conjunctiva pink, PERRLA and EOMs intact; Ears: cerumen impaction; Throat/Mouth: Teeth present, mucosa pink and moist, no exudate, lesions or ulcerations noted.  Neck: Neck supple, trachea midline. No masses, lumps or thyromegaly present.  Cardiovascular: Normal rate and rhythm. S1,S2 noted.  No murmur, rubs or gallops noted. No JVD or BLE edema. No carotid bruits noted. Pulmonary/Chest: Normal effort and positive  vesicular breath sounds. No respiratory distress. No wheezes, rales or ronchi noted.  Abdomen: Soft and nontender. Normal bowel sounds. Ventral hernia noted. Liver, spleen and kidneys non palpable. Musculoskeletal: Strength 5/5 BUE/BLE. No signs of joint swelling. No difficulty with gait.  Neurological: Alert and oriented. Cranial nerves II-XII grossly intact. Coordination normal.  Psychiatric: Mood and affect normal. Behavior is normal. Judgment and thought content normal.     BMET    Component Value Date/Time   NA 138 12/23/2015 1048   K 4.1 12/23/2015 1048   CL 102 12/23/2015 1048   CO2 25 12/23/2015 1048   GLUCOSE 146* 12/23/2015 1048   BUN 24* 12/23/2015 1048   CREATININE 1.01 12/23/2015 1048   CALCIUM 9.8 12/23/2015 1048    Lipid Panel     Component Value Date/Time   CHOL 168 12/23/2015 1048   TRIG 220.0* 12/23/2015 1048   HDL 45.20 12/23/2015 1048   CHOLHDL 4 12/23/2015  1048   VLDL 44.0* 12/23/2015 1048    CBC    Component Value Date/Time   WBC 6.4 12/23/2015 1048   RBC 4.70 12/23/2015 1048   HGB 14.4 12/23/2015 1048   HCT 44.1 12/23/2015 1048   PLT 238.0 12/23/2015 1048   MCV 93.7 12/23/2015 1048   MCHC 32.8 12/23/2015 1048   RDW 13.2 12/23/2015 1048    Hgb A1C Lab Results  Component Value Date   HGBA1C 6.9* 12/23/2015      Assessment and Plan:   Medicare Annual Wellness Visit:  Diet: She does eat meat. She consumes fruits and veggies daily. She tries to avoid fried foods. She drinks mostly water. Physical activity: Sedentary Depression/mood screen: Negative Hearing: Intact to whispered voice Visual acuity: Grossly normal, performs annual eye exam  ADLs: Capable Fall risk: None Home safety: Good Cognitive evaluation: Intact to orientation, naming, recall and repetition EOL planning: No adv directives, full code/ I agree  Preventative Medicine: Flu shot UTD. Will request immunization record from Dr. Carlis Abbott. She declines mammogram, pap smear,  colonoscopy or bone density screening. Encouraged her to see an eye doctor or dentist annually.   Next appointment: 6 months

## 2016-06-22 NOTE — Assessment & Plan Note (Signed)
>>  ASSESSMENT AND PLAN FOR DM2 (DIABETES MELLITUS, TYPE 2) (HCC) WRITTEN ON 06/22/2016  2:59 PM BY BAITY, REGINA W, NP  Uncontrolled Discussed low carb diet and weight loss Continue Janumet She reports her pneumonia vaccines are UTD Flu shot UTD Encouraged yearly eye exams Foot exam today

## 2016-06-22 NOTE — Assessment & Plan Note (Signed)
>>  ASSESSMENT AND PLAN FOR HYPERLIPIDEMIA ASSOCIATED WITH TYPE 2 DIABETES MELLITUS (HCC) WRITTEN ON 06/22/2016  3:00 PM BY BAITY, REGINA W, NP  Encouraged her to consume a low fat diet Continue Lipitor Start baby ASA daily

## 2016-06-22 NOTE — Progress Notes (Signed)
Pre visit review using our clinic review tool, if applicable. No additional management support is needed unless otherwise documented below in the visit note. 

## 2016-06-22 NOTE — Assessment & Plan Note (Signed)
Avoid triggers Encouraged weight loss Continue Zantac daily

## 2016-06-22 NOTE — Assessment & Plan Note (Signed)
Continue Allopurinol

## 2016-06-22 NOTE — Assessment & Plan Note (Signed)
Continue current dose of Synthroid

## 2016-06-22 NOTE — Patient Instructions (Signed)

## 2016-06-22 NOTE — Assessment & Plan Note (Signed)
Encouraged her to consume a low fat diet Continue Lipitor Start baby ASA daily

## 2016-07-12 DIAGNOSIS — J0101 Acute recurrent maxillary sinusitis: Secondary | ICD-10-CM | POA: Diagnosis not present

## 2016-07-12 DIAGNOSIS — E039 Hypothyroidism, unspecified: Secondary | ICD-10-CM | POA: Diagnosis not present

## 2016-07-12 DIAGNOSIS — E119 Type 2 diabetes mellitus without complications: Secondary | ICD-10-CM | POA: Diagnosis not present

## 2016-07-12 DIAGNOSIS — N390 Urinary tract infection, site not specified: Secondary | ICD-10-CM | POA: Diagnosis not present

## 2016-07-12 DIAGNOSIS — I10 Essential (primary) hypertension: Secondary | ICD-10-CM | POA: Diagnosis not present

## 2016-08-19 DIAGNOSIS — J029 Acute pharyngitis, unspecified: Secondary | ICD-10-CM | POA: Diagnosis not present

## 2016-08-19 DIAGNOSIS — E1165 Type 2 diabetes mellitus with hyperglycemia: Secondary | ICD-10-CM | POA: Diagnosis not present

## 2016-09-02 DIAGNOSIS — E1165 Type 2 diabetes mellitus with hyperglycemia: Secondary | ICD-10-CM | POA: Diagnosis not present

## 2016-09-21 DIAGNOSIS — E039 Hypothyroidism, unspecified: Secondary | ICD-10-CM | POA: Diagnosis not present

## 2016-09-21 DIAGNOSIS — Z23 Encounter for immunization: Secondary | ICD-10-CM | POA: Diagnosis not present

## 2016-09-21 DIAGNOSIS — E1165 Type 2 diabetes mellitus with hyperglycemia: Secondary | ICD-10-CM | POA: Diagnosis not present

## 2016-09-21 DIAGNOSIS — I10 Essential (primary) hypertension: Secondary | ICD-10-CM | POA: Diagnosis not present

## 2016-11-04 DIAGNOSIS — E119 Type 2 diabetes mellitus without complications: Secondary | ICD-10-CM | POA: Diagnosis not present

## 2016-12-02 DIAGNOSIS — I10 Essential (primary) hypertension: Secondary | ICD-10-CM | POA: Diagnosis not present

## 2016-12-02 DIAGNOSIS — J0101 Acute recurrent maxillary sinusitis: Secondary | ICD-10-CM | POA: Diagnosis not present

## 2016-12-02 DIAGNOSIS — E039 Hypothyroidism, unspecified: Secondary | ICD-10-CM | POA: Diagnosis not present

## 2016-12-02 DIAGNOSIS — N390 Urinary tract infection, site not specified: Secondary | ICD-10-CM | POA: Diagnosis not present

## 2017-04-05 LAB — HEPATIC FUNCTION PANEL
ALT: 37 — AB (ref 7–35)
AST: 25 (ref 13–35)
Alkaline Phosphatase: 65 (ref 25–125)

## 2017-04-05 LAB — BASIC METABOLIC PANEL
BUN: 17 (ref 4–21)
CREATININE: 1 (ref 0.5–1.1)
Glucose: 95

## 2017-04-05 LAB — LIPID PANEL
Cholesterol: 165 (ref 0–200)
HDL: 39 (ref 35–70)
LDL CALC: 67
TRIGLYCERIDES: 295 — AB (ref 40–160)

## 2017-04-05 LAB — VITAMIN D 25 HYDROXY (VIT D DEFICIENCY, FRACTURES): VIT D 25 HYDROXY: 32.3

## 2017-04-05 LAB — TSH: TSH: 2.14 (ref 0.41–5.90)

## 2017-04-05 LAB — HEMOGLOBIN A1C: HEMOGLOBIN A1C: 6.7

## 2017-08-05 ENCOUNTER — Other Ambulatory Visit: Payer: Self-pay | Admitting: Internal Medicine

## 2017-08-05 DIAGNOSIS — Z1231 Encounter for screening mammogram for malignant neoplasm of breast: Secondary | ICD-10-CM

## 2017-08-24 ENCOUNTER — Ambulatory Visit
Admission: RE | Admit: 2017-08-24 | Discharge: 2017-08-24 | Disposition: A | Discharge status: Home / Self Care | Payer: Medicare Other | Source: Ambulatory Visit | Attending: Internal Medicine | Admitting: Internal Medicine

## 2017-08-24 DIAGNOSIS — Z1231 Encounter for screening mammogram for malignant neoplasm of breast: Secondary | ICD-10-CM

## 2017-11-08 LAB — LIPID PANEL
CHOLESTEROL: 175 (ref 0–200)
HDL: 47 (ref 35–70)
LDL Cholesterol: 91
TRIGLYCERIDES: 183 — AB (ref 40–160)

## 2017-11-08 LAB — BASIC METABOLIC PANEL
BUN: 16 (ref 4–21)
GLUCOSE: 110
POTASSIUM: 4.3 (ref 3.4–5.3)
Sodium: 142 (ref 137–147)

## 2017-11-08 LAB — CBC AND DIFFERENTIAL
HCT: 44 (ref 36–46)
Hemoglobin: 15.4 (ref 12.0–16.0)
PLATELETS: 282 (ref 150–399)
WBC: 6.4

## 2017-11-08 LAB — HEMOGLOBIN A1C: Hemoglobin A1C: 6.5

## 2017-11-10 LAB — HM DIABETES EYE EXAM

## 2017-12-09 ENCOUNTER — Ambulatory Visit (INDEPENDENT_AMBULATORY_CARE_PROVIDER_SITE_OTHER): Payer: Medicare Other

## 2017-12-09 ENCOUNTER — Encounter (HOSPITAL_COMMUNITY): Payer: Self-pay | Admitting: Emergency Medicine

## 2017-12-09 ENCOUNTER — Ambulatory Visit (HOSPITAL_COMMUNITY)
Admission: EM | Admit: 2017-12-09 | Discharge: 2017-12-09 | Disposition: A | Discharge status: Home / Self Care | Payer: Medicare Other | Attending: Emergency Medicine | Admitting: Emergency Medicine

## 2017-12-09 DIAGNOSIS — K5901 Slow transit constipation: Secondary | ICD-10-CM | POA: Diagnosis not present

## 2017-12-09 DIAGNOSIS — R1030 Lower abdominal pain, unspecified: Secondary | ICD-10-CM | POA: Diagnosis not present

## 2017-12-09 DIAGNOSIS — K5900 Constipation, unspecified: Secondary | ICD-10-CM

## 2017-12-09 DIAGNOSIS — R1032 Left lower quadrant pain: Secondary | ICD-10-CM | POA: Diagnosis not present

## 2017-12-09 NOTE — Discharge Instructions (Signed)
Use MiraLAX as directed. Increase fluid, water and fiber in your diet. Follow-up with your doctor as needed.

## 2017-12-09 NOTE — ED Provider Notes (Signed)
Angie    CSN: [id number] Arrival date & time: 12/09/17  1148     History   Chief Complaint Chief Complaint  Patient presents with  . Abdominal Pain    HPI CAREEN MAUCH is a 71 y.o. female.   71 year old female complaining of left mid abdominal pain for 2 weeks. It is intermittent. It seems to be worse today. Nothing makes it better or worse.  Review of systems She initially had some nausea and was relatively mild. No vomiting, diarrhea or constipation. States she has normal daily bowel movements. Denies chest pain or shortness of breath. Denies urinary symptoms. No fever or chills.      Past Medical History:  Diagnosis Date  . History of chicken pox   . History of frequent urinary tract infections   . Hyperlipidemia   . Hypertension   . Overactive bladder   . Thyroid disease     Patient Active Problem List   Diagnosis Date Noted  . Gout 06/23/2015  . DM2 (diabetes mellitus, type 2) ([place]) 06/23/2015  . GERD (gastroesophageal reflux disease) 06/23/2015  . OAB (overactive bladder) 06/23/2015  . HTN (hypertension) 06/21/2013  . Hyperlipidemia 06/21/2013  . Hypothyroidism 06/21/2013    History reviewed. No pertinent surgical history.  OB History    No data available       Home Medications    Prior to Admission medications   Medication Sig Start Date End Date Taking? Authorizing Provider  allopurinol (ZYLOPRIM) 100 MG tablet Take 1 tablet by mouth daily. 05/27/15  Yes [provider]  amLODipine (NORVASC) 5 MG tablet Take 1 tablet by mouth daily. 05/09/15  Yes [provider]  atenolol (TENORMIN) 100 MG tablet Take 100 mg by mouth daily.  04/09/13  Yes [provider]  atorvastatin (LIPITOR) 20 MG tablet Take 20 mg by mouth daily.  04/07/13  Yes [provider]  hydrochlorothiazide (MICROZIDE) 12.5 MG capsule Take 12.5 mg by mouth daily.  06/18/13  Yes [provider]  levothyroxine (SYNTHROID,  LEVOTHROID) 75 MCG tablet Take 75 mcg by mouth daily before breakfast.   Yes [provider]  sitaGLIPtin-metformin (JANUMET) 50-1000 MG per tablet Take 1 tablet by mouth daily as needed.   Yes [provider]  trimethoprim (TRIMPEX) 100 MG tablet TAKE ONE TABLET BY MOUTH ONCE DAILY (NEEDS  OFFICE  VISIT) 06/22/16  Yes Baity, Coralie Keens, NP  nystatin cream (MYCOSTATIN) Apply 1 application topically 2 (two) times daily. 12/23/15   Jearld Fenton, NP  nystatin cream (MYCOSTATIN) APPLY  CREAM TOPICALLY TO AFFECTED AREA TWICE DAILY 04/08/16   Jearld Fenton, NP    Family History Family History  Problem Relation Age of Onset  . Heart disease Father   . Diabetes Paternal Grandmother   . Cancer Paternal Grandmother   . Breast cancer Neg Hx     Social History Social History   Tobacco Use  . Smoking status: Never Smoker  . Smokeless tobacco: Never Used  Substance Use Topics  . Alcohol use: No    Alcohol/week: 0.0 oz  . Drug use: No     Allergies   Patient has no known allergies.   Review of Systems Review of Systems  Constitutional: Negative.   Respiratory: Negative.   Cardiovascular: Negative.   Gastrointestinal: Negative for abdominal distention, blood in stool, constipation and diarrhea.       As per history of present illness  Genitourinary: Negative.   All other systems reviewed and  are negative.    Physical Exam Triage Vital Signs ED Triage Vitals  Enc Vitals Group     BP 12/09/17 1226 (!) 156/63     Pulse Rate 12/09/17 1226 62     Resp 12/09/17 1226 16     Temp 12/09/17 1226 98.3 F (36.8 C)     Temp Source 12/09/17 1226 Oral     SpO2 12/09/17 1226 99 %     Weight --      Height --      Head Circumference --      Peak Flow --      Pain Score 12/09/17 1227 2     Pain Loc --      Pain Edu? --      Excl. in [place]? --    No data found.  Updated Vital Signs BP (!) 156/63 (BP Location: Left Arm)   Pulse 62   Temp 98.3 F (36.8 C) (Oral)    Resp 16   SpO2 99%   Visual Acuity Right Eye Distance:   Left Eye Distance:   Bilateral Distance:    Right Eye Near:   Left Eye Near:    Bilateral Near:     Physical Exam  Constitutional: She is oriented to person, place, and time. She appears well-developed and well-nourished.  Non-toxic appearance. She does not appear ill. No distress.  Eyes: EOM are normal.  Neck: Normal range of motion. Neck supple.  Cardiovascular: Normal rate.  Pulmonary/Chest: Effort normal. No respiratory distress.  Abdominal: Soft. Normal appearance and bowel sounds are normal. She exhibits no distension, no pulsatile midline mass and no mass.  Tenderness localized to the left mid abdomen lateral to the umbilicus. No other tenderness. Abdomen soft. No palpable masses. No rebound or guarding.  Musculoskeletal: She exhibits no edema.  Neurological: She is alert and oriented to person, place, and time. She exhibits normal muscle tone.  Skin: Skin is warm and dry.  Psychiatric: She has a normal mood and affect.  Nursing note and vitals reviewed.    UC Treatments / Results  Labs (all labs ordered are listed, but only abnormal results are displayed) Labs Reviewed - No data to display  EKG  EKG Interpretation None       Radiology Dg Abd 1 View  Result Date: 12/09/2017 CLINICAL DATA:  Lower abdominal pain EXAM: ABDOMEN - 1 VIEW COMPARISON:  10/27/2015 FINDINGS: Scattered large and small bowel gas is noted. Fecal material is noted throughout the colon similar to that seen on the prior exam consistent with mild constipation. No free air is seen. No abnormal mass or abnormal calcifications are noted. Degenerative changes of lumbar spine are seen. IMPRESSION: Mild constipation stable from the previous exam. No new focal abnormality is noted. Electronically Signed   By: Inez Catalina M.D.   On: 12/09/2017 13:37    Procedures Procedures (including critical care time)  Medications Ordered in UC Medications  - No data to display   Initial Impression / Assessment and Plan / UC Course  I have reviewed the triage vital signs and the nursing notes.  Pertinent labs & imaging results that were available during my care of the patient were reviewed by me and considered in my medical decision making (see chart for details).    Use MiraLAX as directed. Increase fluid, water and fiber in your diet. Follow-up with your doctor as needed.    Final Clinical Impressions(s) / UC Diagnoses   Final diagnoses:  Left lower quadrant  pain  Slow transit constipation    ED Discharge Orders    None       Controlled Substance Prescriptions [place] Controlled Substance Registry consulted? Not Applicable   Janne Napoleon, NP 12/09/17 1349

## 2017-12-09 NOTE — ED Triage Notes (Signed)
PT C/O: intermittent LLQ pain .... Hx of hernia .... LBM = today ... Noted no abnormalities   ONSET: 1 week  SX ALSO INCLUDE: intermittent nausea  DENIES: fever, chills, urinary sx, v/d  TAKING MEDS: none   A&O x4... NAD... Ambulatory

## 2018-01-11 ENCOUNTER — Encounter: Payer: Self-pay | Admitting: Family Medicine

## 2018-01-11 ENCOUNTER — Ambulatory Visit (INDEPENDENT_AMBULATORY_CARE_PROVIDER_SITE_OTHER): Payer: Medicare Other | Admitting: Family Medicine

## 2018-01-11 VITALS — BP 146/84 | HR 60 | Ht 65.0 in | Wt 165.3 lb

## 2018-01-11 DIAGNOSIS — E1169 Type 2 diabetes mellitus with other specified complication: Secondary | ICD-10-CM

## 2018-01-11 DIAGNOSIS — E039 Hypothyroidism, unspecified: Secondary | ICD-10-CM | POA: Diagnosis not present

## 2018-01-11 DIAGNOSIS — J3089 Other allergic rhinitis: Secondary | ICD-10-CM

## 2018-01-11 DIAGNOSIS — I152 Hypertension secondary to endocrine disorders: Secondary | ICD-10-CM

## 2018-01-11 DIAGNOSIS — I10 Essential (primary) hypertension: Secondary | ICD-10-CM | POA: Diagnosis not present

## 2018-01-11 DIAGNOSIS — E782 Mixed hyperlipidemia: Secondary | ICD-10-CM

## 2018-01-11 DIAGNOSIS — K219 Gastro-esophageal reflux disease without esophagitis: Secondary | ICD-10-CM | POA: Diagnosis not present

## 2018-01-11 DIAGNOSIS — E559 Vitamin D deficiency, unspecified: Secondary | ICD-10-CM

## 2018-01-11 DIAGNOSIS — E1159 Type 2 diabetes mellitus with other circulatory complications: Secondary | ICD-10-CM | POA: Diagnosis not present

## 2018-01-11 DIAGNOSIS — E1165 Type 2 diabetes mellitus with hyperglycemia: Secondary | ICD-10-CM | POA: Diagnosis not present

## 2018-01-11 LAB — POCT GLYCOSYLATED HEMOGLOBIN (HGB A1C): HEMOGLOBIN A1C: 6.3

## 2018-01-11 NOTE — Patient Instructions (Addendum)
Please realize, EXERCISE IS MEDICINE!  -  American Heart Association ( AHA) guidelines for exercise : If you are in good health, without any medical conditions, you should engage in 150 minutes of moderate intensity aerobic activity per week.  This means you should be huffing and puffing throughout your workout.   Engaging in regular exercise will improve brain function and memory, as well as improve mood, boost immune system and help with weight management.  As well as the other, more well-known effects of exercise such as decreasing blood sugar levels, decreasing blood pressure,  and decreasing bad cholesterol levels/ increasing good cholesterol levels.     -  The AHA strongly endorses consumption of a diet that contains a variety of foods from all the food categories with an emphasis on fruits and vegetables; fat-free and low-fat dairy products; cereal and grain products; legumes and nuts; and fish, poultry, and/or extra lean meats.    Excessive food intake, especially of foods high in saturated and trans fats, sugar, and salt, should be avoided.    Adequate water intake of roughly 1/2 of your weight in pounds, should equal the ounces of water per day you should drink.  So for instance, if you're 200 pounds, that would be 100 ounces of water per day.         Mediterranean Diet  Why follow it? Research shows. . Those who follow the Mediterranean diet have a reduced risk of heart disease  . The diet is associated with a reduced incidence of Parkinson's and Alzheimer's diseases . People following the diet may have longer life expectancies and lower rates of chronic diseases  . The Dietary Guidelines for Americans recommends the Mediterranean diet as an eating plan to promote health and prevent disease  What Is the Mediterranean Diet?  . Healthy eating plan based on typical foods and recipes of Mediterranean-style cooking . The diet is primarily a plant based diet; these foods should make up a  majority of meals   Starches - Plant based foods should make up a majority of meals - They are an important sources of vitamins, minerals, energy, antioxidants, and fiber - Choose whole grains, foods high in fiber and minimally processed items  - Typical grain sources include wheat, oats, barley, corn, brown rice, bulgar, farro, millet, polenta, couscous  - Various types of beans include chickpeas, lentils, fava beans, black beans, white beans   Fruits  Veggies - Large quantities of antioxidant rich fruits & veggies; 6 or more servings  - Vegetables can be eaten raw or lightly drizzled with oil and cooked  - Vegetables common to the traditional Mediterranean Diet include: artichokes, arugula, beets, broccoli, brussel sprouts, cabbage, carrots, celery, collard greens, cucumbers, eggplant, kale, leeks, lemons, lettuce, mushrooms, okra, onions, peas, peppers, potatoes, pumpkin, radishes, rutabaga, shallots, spinach, sweet potatoes, turnips, zucchini - Fruits common to the Mediterranean Diet include: apples, apricots, avocados, cherries, clementines, dates, figs, grapefruits, grapes, melons, nectarines, oranges, peaches, pears, pomegranates, strawberries, tangerines  Fats - Replace butter and margarine with healthy oils, such as olive oil, canola oil, and tahini  - Limit nuts to no more than a handful a day  - Nuts include walnuts, almonds, pecans, pistachios, pine nuts  - Limit or avoid candied, honey roasted or heavily salted nuts - Olives are central to the Mediterranean diet - can be eaten whole or used in a variety of dishes   Meats Protein - Limiting red meat: no more than a few times a month -   When eating red meat: choose lean cuts and keep the portion to the size of deck of cards - Eggs: approx. 0 to 4 times a week  - Fish and lean poultry: at least 2 a week  - Healthy protein sources include, chicken, turkey, lean beef, lamb - Increase intake of seafood such as tuna, salmon, trout,  mackerel, shrimp, scallops - Avoid or limit high fat processed meats such as sausage and bacon  Dairy - Include moderate amounts of low fat dairy products  - Focus on healthy dairy such as fat free yogurt, skim milk, low or reduced fat cheese - Limit dairy products higher in fat such as whole or 2% milk, cheese, ice cream  Alcohol - Moderate amounts of red wine is ok  - No more than 5 oz daily for women (all ages) and men older than age 65  - No more than 10 oz of wine daily for men younger than 65  Other - Limit sweets and other desserts  - Use herbs and spices instead of salt to flavor foods  - Herbs and spices common to the traditional Mediterranean Diet include: basil, bay leaves, chives, cloves, cumin, fennel, garlic, lavender, marjoram, mint, oregano, parsley, pepper, rosemary, sage, savory, sumac, tarragon, thyme   It's not just a diet, it's a lifestyle:  . The Mediterranean diet includes lifestyle factors typical of those in the region  . Foods, drinks and meals are best eaten with others and savored . Daily physical activity is important for overall good health . This could be strenuous exercise like running and aerobics . This could also be more leisurely activities such as walking, housework, yard-work, or taking the stairs . Moderation is the key; a balanced and healthy diet accommodates most foods and drinks . Consider portion sizes and frequency of consumption of certain foods   Meal Ideas & Options:  . Breakfast:  o Whole wheat toast or whole wheat English muffins with peanut butter & hard boiled egg o Steel cut oats topped with apples & cinnamon and skim milk  o Fresh fruit: banana, strawberries, melon, berries, peaches  o Smoothies: strawberries, bananas, greek yogurt, peanut butter o Low fat greek yogurt with blueberries and granola  o Egg white omelet with spinach and mushrooms o Breakfast couscous: whole wheat couscous, apricots, skim milk, cranberries  . Sandwiches:   o Hummus and grilled vegetables (peppers, zucchini, squash) on whole wheat bread   o Grilled chicken on whole wheat pita with lettuce, tomatoes, cucumbers or tzatziki  o Tuna salad on whole wheat bread: tuna salad made with greek yogurt, olives, red peppers, capers, green onions o Garlic rosemary lamb pita: lamb sauted with garlic, rosemary, salt & pepper; add lettuce, cucumber, greek yogurt to pita - flavor with lemon juice and black pepper  . Seafood:  o Mediterranean grilled salmon, seasoned with garlic, basil, parsley, lemon juice and black pepper o Shrimp, lemon, and spinach whole-grain pasta salad made with low fat greek yogurt  o Seared scallops with lemon orzo  o Seared tuna steaks seasoned salt, pepper, coriander topped with tomato mixture of olives, tomatoes, olive oil, minced garlic, parsley, green onions and cappers  . Meats:  o Herbed greek chicken salad with kalamata olives, cucumber, feta  o Red bell peppers stuffed with spinach, bulgur, lean ground beef (or lentils) & topped with feta   o Kebabs: skewers of chicken, tomatoes, onions, zucchini, squash  o Turkey burgers: made with red onions, mint, dill, lemon juice, feta   topped with roasted red peppers . Vegetarian o Cucumber salad: cucumbers, artichoke hearts, celery, red onion, feta cheese, tossed in olive oil & lemon juice  o Hummus and whole grain pita points with a greek salad (lettuce, tomato, feta, olives, cucumbers, red onion) o Lentil soup with celery, carrots made with vegetable broth, garlic, salt and pepper  o Tabouli salad: parsley, bulgur, mint, scallions, cucumbers, tomato, radishes, lemon juice, olive oil, salt and pepper.   Diabetes Mellitus and Standards of Medical Care  Managing diabetes (diabetes mellitus) can be complicated. Your diabetes treatment may be managed by a team of health care providers, including:  A diet and nutrition specialist (registered dietitian).  A nurse.  A certified  diabetes educator (CDE).  A diabetes specialist (endocrinologist).  An eye doctor.  A primary care provider.  A dentist.  Your health care providers follow a schedule in order to help you get the best quality of care. The following schedule is a general guideline for your diabetes management plan. Your health care providers may also give you more specific instructions.  HbA1c (hemoglobin A1c) test This test provides information about blood sugar (glucose) control over the previous 2-3 months. It is used to check whether your diabetes management plan needs to be adjusted.  If you are meeting your treatment goals, this test is done at least 2 times a year.  If you are not meeting treatment goals or if your treatment goals have changed, this test is done 4 times a year.  Blood pressure test  This test is done at every routine medical visit. For most people, the goal is less than 130/80. Ask your health care provider what your goal blood pressure should be.  Dental and eye exams  Visit your dentist two times a year.  If you have type 1 diabetes, get an eye exam 3-5 years after you are diagnosed, and then once a year after your first exam. ? If you were diagnosed with type 1 diabetes as a child, get an eye exam when you are age 84 or older and have had diabetes for 3-5 years. After the first exam, you should get an eye exam once a year.  If you have type 2 diabetes, have an eye exam as soon as you are diagnosed, and then once a year after your first exam.  Foot care exam  Visual foot exams are done at every routine medical visit. The exams check for cuts, bruises, redness, blisters, sores, or other problems with the feet.  A complete foot exam is done by your health care provider once a year. This exam includes an inspection of the structure and skin of your feet, and a check of the pulses and sensation in your feet. ? Type 1 diabetes: Get your first exam 3-5 years after  diagnosis. ? Type 2 diabetes: Get your first exam as soon as you are diagnosed.  Check your feet every day for cuts, bruises, redness, blisters, or sores. If you have any of these or other problems that are not healing, contact your health care provider.  Kidney function test (urine microalbumin)  This test is done once a year. ? Type 1 diabetes: Get your first test 5 years after diagnosis. ? Type 2 diabetes: Get your first test as soon as you are diagnosed._  If you have chronic kidney disease (CKD), get a serum creatinine and estimated glomerular filtration rate (eGFR) test once a year.  Lipid profile (cholesterol, HDL, LDL, triglycerides)  This test  should be done when you are diagnosed with diabetes, and every 5 years after the first test. If you are on medicines to lower your cholesterol, you may need to get this test done every year. ? The goal for LDL is less than 100 mg/dL (5.5 mmol/L). If you are at high risk, the goal is less than 70 mg/dL (3.9 mmol/L). ? The goal for HDL is 40 mg/dL (2.2 mmol/L) for men and 50 mg/dL(2.8 mmol/L) for women. An HDL cholesterol of 60 mg/dL (3.3 mmol/L) or higher gives some protection against heart disease. ? The goal for triglycerides is less than 150 mg/dL (8.3 mmol/L).  Immunizations  The yearly flu (influenza) vaccine is recommended for everyone 6 months or older who has diabetes.  The pneumonia (pneumococcal) vaccine is recommended for everyone 2 years or older who has diabetes. If you are 89 or older, you may get the pneumonia vaccine as a series of two separate shots.  The hepatitis B vaccine is recommended for adults shortly after they have been diagnosed with diabetes.  The Tdap (tetanus, diphtheria, and pertussis) vaccine should be given: ? According to normal childhood vaccination schedules, for children. ? Every 10 years, for adults who have diabetes.  The shingles vaccine is recommended for people who have had chicken pox and are 50  years or older.  Mental and emotional health  Screening for symptoms of eating disorders, anxiety, and depression is recommended at the time of diagnosis and afterward as needed. If your screening shows that you have symptoms (you have a positive screening result), you may need further evaluation and be referred to a mental health care provider.  Diabetes self-management education  Education about how to manage your diabetes is recommended at diagnosis and ongoing as needed.  Treatment plan  Your treatment plan will be reviewed at every medical visit.  Summary  Managing diabetes (diabetes mellitus) can be complicated. Your diabetes treatment may be managed by a team of health care providers.  Your health care providers follow a schedule in order to help you get the best quality of care.  Standards of care including having regular physical exams, blood tests, blood pressure monitoring, immunizations, screening tests, and education about how to manage your diabetes.  Your health care providers may also give you more specific instructions based on your individual health.      Type 2 Diabetes Mellitus, Self Care, Adult Caring for yourself after you have been diagnosed with type 2 diabetes (type 2 diabetes mellitus) means keeping your blood sugar (glucose) under control with a balance of:  Nutrition.  Exercise.  Lifestyle changes.  Medicines or insulin, if necessary.  Support from your team of health care providers and others.  The following information explains what you need to know to manage your diabetes at home. What do I need to do to manage my blood glucose?  Check your blood glucose every day, as often as told by your health care provider.  Contact your health care provider if your blood glucose is above your target for 2 tests in a row.  Have your A1c (hemoglobin A1c) level checked at least two times a year, or as often as told by your health care provider. Your  health care provider will set individualized treatment goals for you. Generally, the goal of treatment is to maintain the following blood glucose levels:  Before meals (preprandial): 80-130 mg/dL (4.4-7.2 mmol/L).  After meals (postprandial): below 180 mg/dL (10 mmol/L).  A1c level: less than 7%.  What do I need to know about hyperglycemia and hypoglycemia? What is hyperglycemia? Hyperglycemia, also called high blood glucose, occurs when blood glucose is too high.Make sure you know the early signs of hyperglycemia, such as:  Increased thirst.  Hunger.  Feeling very tired.  Needing to urinate more often than usual.  Blurry vision.  What is hypoglycemia? Hypoglycemia, also called low blood glucose, occurswith a blood glucose level at or below 70 mg/dL (3.9 mmol/L). The risk for hypoglycemia increases during or after exercise, during sleep, during illness, and when skipping meals or not eating for a long time (fasting). It is important to know the symptoms of hypoglycemia and treat it right away. Always have a 15-gram rapid-acting carbohydrate snack with you to treat low blood glucose. Family members and close friends should also know the symptoms and should understand how to treat hypoglycemia, in case you are not able to treat yourself. What are the symptoms of hypoglycemia? Hypoglycemia symptoms can include:  Hunger.  Anxiety.  Sweating and feeling clammy.  Confusion.  Dizziness or feeling light-headed.  Sleepiness.  Nausea.  Increased heart rate.  Headache.  Blurry vision.  Seizure.  Nightmares.  Tingling or numbness around the mouth, lips, or tongue.  A change in speech.  Decreased ability to concentrate.  A change in coordination.  Restless sleep.  Tremors or shakes.  Fainting.  Irritability.  How do I treat hypoglycemia?  If you are alert and able to swallow safely, follow the 15:15 rule:  Take 15 grams of a rapid-acting carbohydrate.  Rapid-acting options include: ? 1 tube of glucose gel. ? 3 glucose pills. ? 6-8 pieces of hard candy. ? 4 oz (120 mL) of fruit juice. ? 4 oz (120 mL) of regular (not diet) soda.  Check your blood glucose 15 minutes after you take the carbohydrate.  If the repeat blood glucose level is still at or below 70 mg/dL (3.9 mmol/L), take 15 grams of a carbohydrate again.  If your blood glucose level does not increase above 70 mg/dL (3.9 mmol/L) after 3 tries, seek emergency medical care.  After your blood glucose level returns to normal, eat a meal or a snack within 1 hour.  How do I treat severe hypoglycemia? Severe hypoglycemia is when your blood glucose level is at or below 54 mg/dL (3 mmol/L). Severe hypoglycemia is an emergency. Do not wait to see if the symptoms will go away. Get medical help right away. Call your local emergency services (911 in the [place] Do not drive yourself to the hospital. If you have severe hypoglycemia and you cannot eat or drink, you may need an injection of glucagon. A family member or close friend should learn how to check your blood glucose and how to give you a glucagon injection. Ask your health care provider if you need to have an emergency glucagon injection kit available. Severe hypoglycemia may need to be treated in a hospital. The treatment may include getting glucose through an IV tube. You may also need treatment for the cause of your hypoglycemia. Can having diabetes put me at risk for other conditions? Having diabetes can put you at risk for other long-term (chronic) conditions, such as heart disease and kidney disease. Your health care provider may prescribe medicines to help prevent complications from diabetes. These medicines may include:  Aspirin.  Medicine to lower cholesterol.  Medicine to control blood pressure.  What else can I do to manage my diabetes? Take your diabetes medicines as told  If  your health care provider prescribed insulin or  diabetes medicines, take them every day.  Do not run out of insulin or other diabetes medicines that you take. Plan ahead so you always have these available.  If you use insulin, adjust your dosage based on how physically active you are and what foods you eat. Your health care provider will tell you how to adjust your dosage. Make healthy food choices  The things that you eat and drink affect your blood glucose and your insulin dosage. Making good choices helps to control your diabetes and prevent other health problems. A healthy meal plan includes eating lean proteins, complex carbohydrates, fresh fruits and vegetables, low-fat dairy products, and healthy fats. Make an appointment to see a diet and nutrition specialist (registered dietitian) to help you create an eating plan that is right for you. Make sure that you:  Follow instructions from your health care provider about eating or drinking restrictions.  Drink enough fluid to keep your urine clear or pale yellow.  Eat healthy snacks between nutritious meals.  Track the carbohydrates that you eat. Do this by reading food labels and learning the standard serving sizes of foods.  Follow your sick day plan whenever you cannot eat or drink as usual. Make this plan in advance with your health care provider.  Stay active  Exercise regularly, as told by your health care provider. This may include:  Stretching and doing strength exercises, such as yoga or weightlifting, at least 2 times a week.  Doing at least 150 minutes of moderate-intensity or vigorous-intensity exercise each week. This could be brisk walking, biking, or water aerobics. ? Spread out your activity over at least 3 days of the week. ? Do not go more than 2 days in a row without doing some kind of physical activity.  When you start a new exercise or activity, work with your health care provider to adjust your insulin, medicines, or food intake as needed. Make healthy  lifestyle choices  Do not use any tobacco products, such as cigarettes, chewing tobacco, and e-cigarettes. If you need help quitting, ask your health care provider.  If your health care provider says that alcohol is safe for you, limit alcohol intake to no more than 1 drink per day for nonpregnant women and 2 drinks per day for men. One drink equals 12 oz of beer, 5 oz of wine, or 1 oz of hard liquor.  Learn to manage stress. If you need help with this, ask your health care provider. Care for your body   Keep your immunizations up to date. In addition to getting vaccinations as told by your health care provider, it is recommended that you get vaccinated against the following illnesses: ? The flu (influenza). Get a flu shot every year. ? Pneumonia. ? Hepatitis B.  Schedule an eye exam soon after your diagnosis, and then one time every year after that.  Check your skin and feet every day for cuts, bruises, redness, blisters, or sores. Schedule a foot exam with your health care provider once every year.  Brush your teeth and gums two times a day, and floss at least one time a day. Visit your dentist at least once every 6 months.  Maintain a healthy weight. General instructions  Take over-the-counter and prescription medicines only as told by your health care provider.  Share your diabetes management plan with people in your workplace, school, and household.  Check your urine for ketones when you are ill and  as told by your health care provider.  Ask your health care provider: ? Do I need to meet with a diabetes educator? ? Where can I find a support group for people with diabetes?  Carry a medical alert card or wear medical alert jewelry.  Keep all follow-up visits as told by your health care provider. This is important. Where to find more information: For more information about diabetes, visit:  American Diabetes Association (ADA): www.diabetes.org  American Association of  Diabetes Educators (AADE): www.diabeteseducator.org/patient-resources  This information is not intended to replace advice given to you by your health care provider. Make sure you discuss any questions you have with your health care provider. Document Released: 04/05/2016 Document Revised: 05/20/2016 Document Reviewed: 01/16/2016 Elsevier Interactive Patient Education  2017 [employer].      Blood Glucose Monitoring, Adult Monitoring your blood sugar (glucose) helps you manage your diabetes. It also helps you and your health care provider determine how well your diabetes management plan is working. Blood glucose monitoring involves checking your blood glucose as often as directed, and keeping a record (log) of your results over time. Why should I monitor my blood glucose? Checking your blood glucose regularly can:  Help you understand how food, exercise, illnesses, and medicines affect your blood glucose.  Let you know what your blood glucose is at any time. You can quickly tell if you are having low blood glucose (hypoglycemia) or high blood glucose (hyperglycemia).  Help you and your health care provider adjust your medicines as needed.  When should I check my blood glucose? Follow instructions from your health care provider about how often to check your blood glucose.   This may depend on:  The type of diabetes you have.  How well-controlled your diabetes is.  Medicines you are taking.  If you have type 1 diabetes:  Check your blood glucose at least 2 times a day.  Also check your blood glucose: ? Before every insulin injection. ? Before and after exercise. ? Between meals. ? 2 hours after a meal. ? Occasionally between 2:00 a.m. and 3:00 a.m., as directed. ? Before potentially dangerous tasks, like driving or using heavy machinery. ? At bedtime.  You may need to check your blood glucose more often, up to 6-10 times a day: ? If you use an insulin pump. ? If you need  multiple daily injections (MDI). ? If your diabetes is not well-controlled. ? If you are ill. ? If you have a history of severe hypoglycemia. ? If you have a history of not knowing when your blood glucose is getting low (hypoglycemia unawareness).  If you have type 2 diabetes:  If you take insulin or other diabetes medicines, check your blood glucose at least 2 times a day.  If you are on intensive insulin therapy, check your blood glucose at least 4 times a day. Occasionally, you may also need to check between 2:00 a.m. and 3:00 a.m., as directed.  Also check your blood glucose: ? Before and after exercise. ? Before potentially dangerous tasks, like driving or using heavy machinery.  You may need to check your blood glucose more often if: ? Your medicine is being adjusted. ? Your diabetes is not well-controlled. ? You are ill.  What is a blood glucose log?  A blood glucose log is a record of your blood glucose readings. It helps you and your health care provider: ? Look for patterns in your blood glucose over time. ? Adjust your diabetes management  plan as needed.  Every time you check your blood glucose, write down your result and notes about things that may be affecting your blood glucose, such as your diet and exercise for the day.  Most glucose meters store a record of glucose readings in the meter. Some meters allow you to download your records to a computer. How do I check my blood glucose? Follow these steps to get accurate readings of your blood glucose: Supplies needed   Blood glucose meter.  Test strips for your meter. Each meter has its own strips. You must use the strips that come with your meter.  A needle to prick your finger (lancet). Do not use lancets more than once.  A device that holds the lancet (lancing device).  A journal or log book to write down your results.  Procedure  Wash your hands with soap and water.  Prick the side of your finger (not  the tip) with the lancet. Use a different finger each time.  Gently rub the finger until a small drop of blood appears.  Follow instructions that come with your meter for inserting the test strip, applying blood to the strip, and using your blood glucose meter.  Write down your result and any notes.  Alternative testing sites  Some meters allow you to use areas of your body other than your finger (alternative sites) to test your blood.  If you think you may have hypoglycemia, or if you have hypoglycemia unawareness, do not use alternative sites. Use your finger instead.  Alternative sites may not be as accurate as the fingers, because blood flow is slower in these areas. This means that the result you get may be delayed, and it may be different from the result that you would get from your finger.  The most common alternative sites are: ? Forearm. ? Thigh. ? Palm of the hand.  Additional tips  Always keep your supplies with you.  If you have questions or need help, all blood glucose meters have a 24-hour "hotline" number that you can call. You may also contact your health care provider.  After you use a few boxes of test strips, adjust (calibrate) your blood glucose meter by following instructions that came with your meter.    The American Diabetes Association suggests the following targets for most nonpregnant adults with diabetes.  More or less stringent glycemic goals may be appropriate for each individual.  A1C: Less than 7% A1C may also be reported as eAG: Less than 154 mg/dl Before a meal (preprandial plasma glucose): 80-130 mg/dl 1-2 hours after beginning of the meal (Postprandial plasma glucose)*: Less than 180 mg/dl  *Postprandial glucose may be targeted if A1C goals are not met despite reaching preprandial glucose goals.   GOALS in short:  The goals are for the Hgb A1C to be less than 7.0 & blood pressure to be less than 130/80.    It is recommended that all  diabetics are educated on and follow a healthy diabetic diet, exercise for 30 minutes 3-4 times per week (walking, biking, swimming, or machine), monitor blood glucose readings and bring that record with you to be reviewed at your next office visit.     You should be checking fasting blood sugars- especially after you eat poorly or eat really healthy, and also check 2 hour postprandial blood sugars after largest meal of the day.    Write these down and bring in your log at each office visit.    You  will need to be seen every 3 months by the provider managing your Diabetes unless told otherwise by that provider.   You will need yearly eye exams from an eye specialist and foot exams to check the nerves of your feet.  Also, your urine should be checked yearly as well to make sure excess protein is not present.   If you are checking your blood pressure at home, please record it and bring it to your next office visit.    Follow the Dietary Approaches to Stop Hypertension (DASH) diet (3 servings of fruit and vegetables daily, whole grains, low sodium, low-fat proteins).  See below.    Lastly, when it comes to your cholesterol, the goal is to have the HDL (good cholesterol) >40, and the LDL (bad cholesterol) <100.   It is recommended that you follow a heart healthy, low saturated and trans-fat diet and exercise for 30 minutes at least 5 times a week.     (( Check out the DASH diet = 1.5 Gram Low Sodium Diet   A 1.5 gram sodium diet restricts the amount of sodium in the diet to no more than 1.5 g or 1500 mg daily.  The American Heart Association recommends Americans over the age of 43 to consume no more than 1500 mg of sodium each day to reduce the risk of developing high blood pressure.  Research also shows that limiting sodium may reduce heart attack and stroke risk.  Many foods contain sodium for flavor and sometimes as a preservative.  When the amount of sodium in a diet needs to be low, it is important  to know what to look for when choosing foods and drinks.  The following includes some information and guidelines to help make it easier for you to adapt to a low sodium diet.    QUICK TIPS  Do not add salt to food.  Avoid convenience items and fast food.  Choose unsalted snack foods.  Buy lower sodium products, often labeled as "lower sodium" or "no salt added."  Check food labels to learn how much sodium is in 1 serving.  When eating at a restaurant, ask that your food be prepared with less salt or none, if possible.    READING FOOD LABELS FOR SODIUM INFORMATION  The nutrition facts label is a good place to find how much sodium is in foods. Look for products with no more than 400 mg of sodium per serving.  Remember that 1.5 g = 1500 mg.  The food label may also list foods as:  Sodium-free: Less than 5 mg in a serving.  Very low sodium: 35 mg or less in a serving.  Low-sodium: 140 mg or less in a serving.  Light in sodium: 50% less sodium in a serving. For example, if a food that usually has 300 mg of sodium is changed to become light in sodium, it will have 150 mg of sodium.  Reduced sodium: 25% less sodium in a serving. For example, if a food that usually has 400 mg of sodium is changed to reduced sodium, it will have 300 mg of sodium.    CHOOSING FOODS  Grains  Avoid: Salted crackers and snack items. Some cereals, including instant hot cereals. Bread stuffing and biscuit mixes. Seasoned rice or pasta mixes.  Choose: Unsalted snack items. Low-sodium cereals, oats, puffed wheat and rice, shredded wheat. English muffins and bread. Pasta.  Meats  Avoid: Salted, canned, smoked, spiced, pickled meats, including fish and poultry. Berniece Salines,  ham, sausage, cold cuts, hot dogs, anchovies.  Choose: Low-sodium canned tuna and salmon. Fresh or frozen meat, poultry, and fish.  Dairy  Avoid: Processed cheese and spreads. Cottage cheese. Buttermilk and condensed milk. Regular cheese.  Choose: Milk.  Low-sodium cottage cheese. Yogurt. Sour cream. Low-sodium cheese.  Fruits and Vegetables  Avoid: Regular canned vegetables. Regular canned tomato sauce and paste. Frozen vegetables in sauces. Olives. Angie Fava. Relishes. Sauerkraut.  Choose: Low-sodium canned vegetables. Low-sodium tomato sauce and paste. Frozen or fresh vegetables. Fresh and frozen fruit.  Condiments  Avoid: Canned and packaged gravies. Worcestershire sauce. Tartar sauce. Barbecue sauce. Soy sauce. Steak sauce. Ketchup. Onion, garlic, and table salt. Meat flavorings and tenderizers.  Choose: Fresh and dried herbs and spices. Low-sodium varieties of mustard and ketchup. Lemon juice. Tabasco sauce. Horseradish.    SAMPLE 1.5 GRAM SODIUM MEAL PLAN:   Breakfast / Sodium (mg)  1 cup low-fat milk / 143 mg  1 whole-wheat English muffin / 240 mg  1 tbs heart-healthy margarine / 153 mg  1 hard-boiled egg / 139 mg  1 small orange / 0 mg  Lunch / Sodium (mg)  1 cup raw carrots / 76 mg  2 tbs no salt added peanut butter / 5 mg  2 slices whole-wheat bread / 270 mg  1 tbs jelly / 6 mg   cup red grapes / 2 mg  Dinner / Sodium (mg)  1 cup whole-wheat pasta / 2 mg  1 cup low-sodium tomato sauce / 73 mg  3 oz lean ground beef / 57 mg  1 small side salad (1 cup raw spinach leaves,  cup cucumber,  cup yellow bell pepper) with 1 tsp olive oil and 1 tsp red wine vinegar / 25 mg  Snack / Sodium (mg)  1 container low-fat vanilla yogurt / 107 mg  3 graham cracker squares / 127 mg  Nutrient Analysis  Calories: 1745  Protein: 75 g  Carbohydrate: 237 g  Fat: 57 g  Sodium: 1425 mg  Document Released: 12/13/2005 Document Revised: 08/25/2011 Document Reviewed: 03/16/2010  ExitCare Patient Information 2012 [place].))    This information is not intended to replace advice given to you by your health care provider. Make sure you discuss any questions you have with your health care provider. Document Released: 12/16/2003 Document  Revised: 07/02/2016 Document Reviewed: 05/24/2016 Elsevier Interactive Patient Education  2017 [employer].

## 2018-01-11 NOTE — Progress Notes (Signed)
New patient office visit note:  Impression and Recommendations:    1. Type 2 diabetes mellitus with hyperglycemia, without long-term current use of insulin ([place])   2. Hypertension associated with diabetes ([place])   3. Mixed diabetic hyperlipidemia associated with type 2 diabetes mellitus ([place])   4. Hypothyroidism, unspecified type   5. Gastroesophageal reflux disease without esophagitis   6. Environmental and seasonal allergies   7. Vitamin D deficiency    1) DM2: A1c today is 6.3. Pt is compliant with their medications and is tolerating them well. Pt is stable and instructed to continue medications as prescribed. Pt instructed to continue checking her blood sugars at home. Dietary and exercise guidelines discussed.  2) HTN associated with diabetes: Pt is compliant with their medications and is tolerating them well. Pt is stable and instructed to continue medications as prescribed. Dietary and exercise guidelines discussed.  3) mixed diabetic hyperlipidemia assoc. With DM2: Pt is compliant with their medications and is tolerating them well. Pt is stable and instructed to continue medications as prescribed. Dietary and exercise guidelines provided.  4) Hypothyroidism. Pt is compliant with their medications and is tolerating them well. Pt is stable and instructed to continue medications as prescribed.   5) GERD: continue taking your medications as needed.  6) Environmental and seasonal allergies: Pt is compliant with her medications and is tolerating them well. Pt is stable at this time.   7) Vitamin D Deficiency: Pt is compliant with their supplements and is tolerating them well. Pt is stable at this time.   -Pt instructed to bring a list of her medications as well as her other physicians' names.  -Follow up in the near future to schedule 2 appointments, one to take fasting blood work, and another to meet and discuss the results.    Education and routine counseling performed.  Handouts provided.  Orders Placed This Encounter  Procedures  . CBC with Differential/Platelet  . Comprehensive metabolic panel  . Lipid panel  . T4, free  . TSH  . Microalbumin / creatinine urine ratio  . VITAMIN D 25 Hydroxy (Vit-D Deficiency, Fractures)  . POCT glycosylated hemoglobin (Hb A1C)    Gross side effects, risk and benefits, and alternatives of medications discussed with patient.  Patient is aware that all medications have potential side effects and we are unable to predict every side effect or drug-drug interaction that may occur.  Expresses verbal understanding and consents to current therapy plan and treatment regimen.  Return for Fasting bldwrk-near future;then OV w me 1 wk later.  Please see AVS handed out to patient at the end of our visit for further patient instructions/ counseling done pertaining to today's office visit.    Note: This document was prepared using Dragon voice recognition software and may include unintentional dictation errors.  This document serves as a record of services personally performed by Mellody Dance, DO. It was created on her behalf by Mayer Masker, a trained medical scribe. The creation of this record is based on the scribe's personal observations and the provider's statements to them.   I have reviewed the above medical documentation for accuracy and completeness and I concur.  Mellody Dance 01/11/18 6:45 PM   ----------------------------------------------------------------------------------------------------------------------    Subjective:    Chief complaint:   Chief Complaint  Patient presents with  . Establish Care     HPI: Jill Daugherty is a pleasant 72 y.o. female who presents to [facility]  today to review their medical history with me and establish care.  Pt has been married for 10 years, is on her second marriage, and she has one child and grandchild. She has two dogs and does  adult coloring book and cross stitching. She does not smoke. Pt takes vitamin D and calcium supplements. Pt walks 20x/day, 6-7 times a week and she tries to watch what she eats.   I asked the patient to review their chronic problem list with me to ensure everything was updated and accurate.    All recent office visits with other providers, any medical records that patient brought in etc  - I reviewed today.     We asked pt to get [place] their medical records from [facility] providers/ specialists that they had seen within the past 3-5 years- if they are in private practice and/or do not work for [employer], [facility], [place], [facility] or [employer] owned practice.  Told them to call their specialists to clarify this if they are not sure.   Pt's other providers:  Dr. Jeanann Lewandowsky was her previous PCP/endocrinologist, and now he has retired. She saw him for 30 years.  Pt gets a yearly mammogram at [facility].  She gets yearly eye exams, her eye doctor is Dr. Einar Gip.  Pt does not have a obgyn because Dr. Carlis Abbott did this for her.   Pt does not have a gastroenterologist. Her last colonoscopy was over 20 years ago and she "does not want one"  She has a dermatologist, Carlynn Herald, PA-C.  She does not have a cardiologist.  Pt does not have a podiatrist.    FMHx: Grandmother DM2  PMHx:   DM2: Pt has had DM2 for 2-3 years. Pt takes metformin but feels great. Pt is compliant with this medication. Her sugar this morning was 119 and she tracks this regularly. She is working to get her A1c down. She denies any symptoms. She also watches what she eats.   HTN: Pt is compliant with her medications: Pt does not check her BP at home. HLD: Pt is compliant with her medications and is tolerating them well. Hypothyroidism: Pt is compliant with her medications and tolerating them well. GERD: Pt takes her medications as-needed. Gout: Pt is compliant with her medications. She does not know where her gout is  located.  She has seasonal allergies and has a prescription from Dr. Carlis Abbott, but does not know the name.     Wt Readings from Last 3 Encounters:  01/11/18 165 lb 4.8 oz (75 kg)  06/22/16 174 lb 12 oz (79.3 kg)  12/23/15 178 lb (80.7 kg)   BP Readings from Last 3 Encounters:  01/11/18 (!) 146/84  12/09/17 (!) 156/63  06/22/16 138/78   Pulse Readings from Last 3 Encounters:  01/11/18 60  12/09/17 62  06/22/16 63   BMI Readings from Last 3 Encounters:  01/11/18 27.51 kg/m  06/22/16 30.47 kg/m  12/23/15 29.62 kg/m    Patient Care Team    Relationship Specialty Notifications Start End  Mellody Dance, DO PCP - General Family Medicine  12/09/17   Druscilla Brownie, MD Consulting Physician Dermatology  01/11/18   Webb Laws, [place] Referring Physician Optometry  01/11/18   Celedonio Savage, PA-C Consulting Physician Dermatology  01/11/18    Comment: derm     Patient Active Problem List   Diagnosis Date Noted  . Hypertension associated with diabetes ([place]) 01/11/2018    Priority: High  . Mixed diabetic hyperlipidemia associated  with type 2 diabetes mellitus ([place]) 01/11/2018    Priority: High  . DM2 (diabetes mellitus, type 2) ([place]) 06/23/2015    Priority: High  . Gout 06/23/2015    Priority: Medium  . GERD (gastroesophageal reflux disease) 06/23/2015    Priority: Medium  . Environmental and seasonal allergies 01/11/2018  . Vitamin D deficiency 01/11/2018  . OAB (overactive bladder) 06/23/2015  . HTN (hypertension) 06/21/2013  . Hyperlipidemia 06/21/2013  . Hypothyroidism 06/21/2013     Past Medical History:  Diagnosis Date  . History of chicken pox   . History of frequent urinary tract infections   . Hyperlipidemia   . Hypertension   . Overactive bladder   . Thyroid disease      Past Medical History:  Diagnosis Date  . History of chicken pox   . History of frequent urinary tract infections   . Hyperlipidemia   . Hypertension   . Overactive bladder   .  Thyroid disease      No past surgical history on file.   Family History  Problem Relation Age of Onset  . Heart disease Father   . Diabetes Paternal Grandmother   . Cancer Paternal Grandmother   . Breast cancer Neg Hx      Social History   Substance and Sexual Activity  Drug Use No     Social History   Substance and Sexual Activity  Alcohol Use No  . Alcohol/week: 0.0 oz     Social History   Tobacco Use  Smoking Status Never Smoker  Smokeless Tobacco Never Used     Current Meds  Medication Sig  . allopurinol (ZYLOPRIM) 100 MG tablet Take 1 tablet by mouth daily.  Marland Kitchen amLODipine (NORVASC) 5 MG tablet Take 1 tablet by mouth daily.  Marland Kitchen aspirin EC 81 MG tablet Take 81 mg by mouth daily.  Marland Kitchen atenolol (TENORMIN) 100 MG tablet Take 100 mg by mouth daily.   Marland Kitchen atorvastatin (LIPITOR) 20 MG tablet Take 20 mg by mouth daily.   . Calcium Carbonate (CALCIUM 600 PO) Take by mouth.  . Cholecalciferol (VITAMIN D3) 1000 units CAPS Take 1 capsule by mouth.  . hydrochlorothiazide (MICROZIDE) 12.5 MG capsule Take 12.5 mg by mouth daily.   Marland Kitchen levothyroxine (SYNTHROID, LEVOTHROID) 50 MCG tablet Take 1 tablet by mouth daily.  . metFORMIN (GLUCOPHAGE-XR) 500 MG 24 hr tablet Take 1 tablet by mouth daily.  . ranitidine (ZANTAC) 150 MG capsule Take 150 mg by mouth 2 (two) times daily.  . [DISCONTINUED] levothyroxine (SYNTHROID, LEVOTHROID) 75 MCG tablet Take 75 mcg by mouth daily before breakfast.  . [DISCONTINUED] nystatin cream (MYCOSTATIN) Apply 1 application topically 2 (two) times daily.  . [DISCONTINUED] nystatin cream (MYCOSTATIN) APPLY  CREAM TOPICALLY TO AFFECTED AREA TWICE DAILY  . [DISCONTINUED] sitaGLIPtin-metformin (JANUMET) 50-1000 MG per tablet Take 1 tablet by mouth daily as needed.  . [DISCONTINUED] trimethoprim (TRIMPEX) 100 MG tablet TAKE ONE TABLET BY MOUTH ONCE DAILY (NEEDS  OFFICE  VISIT)    Allergies: Patient has no known allergies.   Review of Systems    Constitutional: Negative for chills, diaphoresis, fever, malaise/fatigue and weight loss.  HENT: Negative for congestion, sore throat and tinnitus.   Eyes: Negative for blurred vision, double vision and photophobia.  Respiratory: Negative for cough and wheezing.   Cardiovascular: Negative for chest pain and palpitations.  Gastrointestinal: Negative for blood in stool, diarrhea, nausea and vomiting.  Genitourinary: Negative for dysuria, frequency and urgency.  Musculoskeletal: Negative for joint pain and  myalgias.  Skin: Negative for itching and rash.  Neurological: Negative for dizziness, focal weakness, weakness and headaches.  Endo/Heme/Allergies: Negative for environmental allergies and polydipsia. Does not bruise/bleed easily.  Psychiatric/Behavioral: Negative for depression and memory loss. The patient is not nervous/anxious and does not have insomnia.      Objective:   Blood pressure (!) 146/84, pulse 60, height 5\' 5"  (1.651 m), weight 165 lb 4.8 oz (75 kg), SpO2 98 %. Body mass index is 27.51 kg/m. General: Well Developed, well nourished, and in no acute distress.  Neuro: Alert and oriented x3, extra-ocular muscles intact, sensation grossly intact.  HEENT:[place]/AT, PERRLA, neck supple, No carotid bruits Skin: no gross rashes  Cardiac: Regular rate and rhythm Respiratory: Essentially clear to auscultation bilaterally. Not using accessory muscles, speaking in full sentences.  Abdominal: not grossly distended Musculoskeletal: Ambulates w/o diff, FROM * 4 ext.  Vasc: less 2 sec cap RF, warm and pink  Psych:  No HI/SI, judgement and insight good, Euthymic mood. Full Affect.    Recent Results (from the past 2160 hour(s))  POCT glycosylated hemoglobin (Hb A1C)     Status: Abnormal   Collection Time: 01/11/18  5:11 PM  Result Value Ref Range   Hemoglobin A1C 6.3

## 2018-01-17 ENCOUNTER — Ambulatory Visit (INDEPENDENT_AMBULATORY_CARE_PROVIDER_SITE_OTHER): Payer: Medicare Other | Admitting: Family Medicine

## 2018-01-17 ENCOUNTER — Encounter: Payer: Self-pay | Admitting: Family Medicine

## 2018-01-17 VITALS — BP 150/66 | HR 54 | Temp 97.4°F | Ht 65.0 in | Wt 163.2 lb

## 2018-01-17 DIAGNOSIS — J029 Acute pharyngitis, unspecified: Secondary | ICD-10-CM

## 2018-01-17 LAB — POCT RAPID STREP A (OFFICE): Rapid Strep A Screen: NEGATIVE

## 2018-01-17 NOTE — Progress Notes (Signed)
Acute Care Office visit  Assessment and plan:  1. Sore throat     1) Sore throat: Pt recommended to continue using a humidifier at home. Continue gargling salt water, using chloraseptic spray or throat lozenges, and drinking plenty of fluids and resting. Recommended use of a neti pot if feeling congested. If symptoms persist beyond 2-3 weeks, schedule an office visit for further evaluation.  - Viral vs Allergic vs Bacterial causes for pt's symptoms reveiwed.    - Supportive care and various OTC medications discussed in addition to any prescribed. - Call or RTC if new symptoms, or if no improvement or worse over next several days.   - Will consider ABX if sx continue past 10 days and worsening.    Orders Placed This Encounter  Procedures  . POCT rapid strep A    Gross side effects, risk and benefits, and alternatives of medications discussed with patient.  Patient is aware that all medications have potential side effects and we are unable to predict every sideeffect or drug-drug interaction that may occur.  Expresses verbal understanding and consents to current therapy plan and treatment regiment.   Education and routine counseling performed. Handouts provided.  Anticipatory guidance and routine counseling done re: condition, txmnt options and need for follow up. All questions of patient's were answered.  Return if symptoms worsen or fail to improve.  Please see AVS handed out to patient at the end of our visit for additional patient instructions/ counseling done pertaining to today's office visit.  Note: This document was partially repared using Dragon voice recognition software and may include unintentional dictation errors.  This document serves as a record of services personally performed by Mellody Dance, DO. It was created on her behalf by Mayer Masker, a trained medical scribe. The creation of this record is based on the scribe's personal observations and the  provider's statements to them.   I have reviewed the above medical documentation for accuracy and completeness and I concur.  Mellody Dance 01/17/18 2:39 PM   Subjective:    Chief Complaint  Patient presents with  . Sore Throat    HPI:  Pt presents with Sx for 2 days   C/o: sore throat and hoarse voice, but patient feels fine otherwise.  Denies: fever, HA, rhinorrhea, head congestion, nausea, vomiting, diarrhea, and abdominal pain.    For symptoms patient has tried:  gargling with salt water with mild relief.  Overall getting:   worse   She states she has had these symptoms once before but it was many years ago. She uses humidifiers at home.  Patient Care Team    Relationship Specialty Notifications Start End  Mellody Dance, DO PCP - General Family Medicine  12/09/17   Druscilla Brownie, MD Consulting Physician Dermatology  01/11/18   Webb Laws, [place] Referring Physician Optometry  01/11/18   Celedonio Savage, PA-C Consulting Physician Dermatology  01/11/18    Comment: derm     Past medical history, Surgical history, Family history reviewed and noted below, Social history, Allergies, and Medications have been entered into the medical record, reviewed and changed as needed.   No Known Allergies  Review of Systems: - see above HPI for pertinent positives General:   No F/C, wt loss Pulm:   No DIB, pleuritic chest pain Card:  No CP, palpitations Abd:  No n/v/d or pain Ext:  No inc edema from baseline   Objective:   Blood pressure (!) 150/66, pulse (!) 54, temperature (!)  97.4 F (36.3 C), height 5\' 5"  (1.651 m), weight 163 lb 3.2 oz (74 kg), SpO2 98 %. Body mass index is 27.16 kg/m. General: Well Developed, well nourished, appropriate for stated age.  Neuro: Alert and oriented x3, extra-ocular muscles intact, sensation grossly intact.  HEENT: Normocephalic, atraumatic, pupils equal round reactive to light, neck supple, no masses, no painful lymphadenopathy,  TM's intact B/L, no acute findings. Nares- patent, clear d/c, OP- clear, mild erythema, No TTP sinuses Skin: Warm and dry, no gross rash. Cardiac: RRR, S1 S2,  no murmurs rubs or gallops.  Respiratory: ECTA B/L and A/P, Not using accessory muscles, speaking in full sentences- unlabored. Vascular:  No gross lower ext edema, cap RF less 2 sec. Psych: No HI/SI, judgement and insight good, Euthymic mood. Full Affect.

## 2018-01-17 NOTE — Patient Instructions (Signed)
You most likely have a viral infection that should resolve on its own over time (or this could be a flare of seasonal allergies as well).   Symptoms for a viral upper respiratory tract infection usually last 3-7 days but can stretch out to 2-3 weeks before you're feeling back to normal.  Your symptoms should not worsen after 7-10 days and if they truely do, please notify our office, as you may need antibiotics.  You can use over-the-counter afrin nasal spray for up to 3 days (NO longer than that) which will help acutely with nasal drainage/ congestion short term.   Also, sterile saline nasal rinses, such as Neil med or AYR sinus rinses, can be very helpful and should be done twice daily- especially throughout the allergy season.   Remember you should use distilled water or previously boiled water to do this.   You can also use an over the counter cold and flu medication such as Tylenol Severe Cold and Sinus/Flu or Dayquil, Nyquil and the like, which will help with cough, congestion, headache/ pain, fevers/chills etc.  Please note, if you being treated for hypertension or have high blood pressure, you should be using the cold meds designated "HBP".    Unfortunately, antibiotics are not helpful for viral infections.  Wash your hands frequently, as you did not want to get those around you sick as well. Never sneeze or cough on others.  And you should not be going to school or work if you are running a temperature of 100.5 or more on two separate occasions.   Drink plenty of fluids and stay hydrated, especially if you are running fevers.  We don't know why, but chicken soup also helps, try it! :)     Upper respiratory viral infection, Adult Adenoviruses are common viruses that cause many different types of infections. The viruses usually affect the lungs, but they can also affect other parts of the body, including the eyes, stomach, bowels, bladder, and brain. The most common type of adenovirus  infection is the common cold. Usually, adenovirus infections are not severe unless you have another health problem that makes it hard for your body to fight off infection. What are the causes? You can get this condition if you:  Touch a surface or object that has an adenovirus on it and then touch your mouth, nose, or eyes with unwashed hands.  Come into close physical contact with an infected person, such as by hugging or shaking hands.  Breathe in droplets that fly through the air when an infected person talks, coughs, or sneezes.  Have contact with infected stool.  Swim in a pool that does not have enough chlorine.  Adenoviruses can live outside the body for many weeks. They spread easily from person to person (are contagious). What increases the risk? This condition is more likely to develop in:  People who spend a lot of time in places where there are many people, such as schools, summer camps, daycare centers, community centers, and military recruit training centers.  Elderly adults.  People with a weak body defense system (immune system).  People with a lung disease.  People with a heart condition.  What are the signs or symptoms? Adenovirus infections usually cause flu-like symptoms. Once the virus gets into the body, symptoms of this condition can take up to 14 days to develop. Symptoms may include:  Headache.  Stiff neck.  Sleepiness or fatigue.  Confusion or disorientation.  Fever.  Sore throat.  Cough.    Trouble breathing.  Runny nose or congestion.  Pink eye (conjunctivitis).  Bleeding into the covering of the eye.  Stomachache or diarrhea.  Nausea or vomiting.  Blood in the urine or pain while urinating.  Ear pain or fullness.  How is this diagnosed? This condition may be diagnosed based on your symptoms and a physical exam. Your health care provider may order tests to make sure your symptoms are not caused by another type of problem. Tests  can include:  Blood tests.  Urine tests.  Stool tests.  Chest X-ray.  Tissue or throat culture.  How is this treated? This condition goes away on its own with time. Treatment for this condition involves managing symptoms until the condition goes away. Your health care provider may recommend:  Rest.  Drinking more fluids.  Taking over-the-counter medicine to help relieve a sore throat, fever, or headache.  Follow these instructions at home:  Rest at home until your symptoms go away.  Drink enough fluid to keep your urine clear or pale yellow.  Take over-the-counter and prescription medicines only as told by your health care provider.  Keep all follow-up visits as told by your health care provider. This is important. How is this prevented? Adenoviruses are resistant to many cleaning products and can remain on surfaces for long periods of time. To help prevent infection:  Wash your hands often with soap and water.  Cover your nose or mouth when you sneeze or cough.  Do not touch your eyes, nose, or mouth with unwashed hands.  Clean commonly used objects often.  Do not swim in a pool that is not properly chlorinated.  Avoid close contact with people who are sick.  Do not go to school or work when you are sick.  Contact a health care provider if:  Your symptoms do not improve after 10 days.  Your symptoms get worse.  You cannot eat or drink without vomiting. Get help right away if:  You have trouble breathing or you are breathing rapidly.  Your skin, lips, or fingernails look blue (cyanosis).  You have a rapid heart rate.  You become confused.  You lose consciousness. This information is not intended to replace advice given to you by your health care provider. Make sure you discuss any questions you have with your health care provider. Document Released: 03/05/2003 Document Revised: 08/09/2016 Document Reviewed: 08/09/2016 Elsevier Interactive Patient  Education  2018 [employer]   

## 2018-01-23 ENCOUNTER — Ambulatory Visit (INDEPENDENT_AMBULATORY_CARE_PROVIDER_SITE_OTHER): Payer: Medicare Other | Admitting: Family Medicine

## 2018-01-23 ENCOUNTER — Encounter: Payer: Self-pay | Admitting: Family Medicine

## 2018-01-23 VITALS — BP 145/83 | HR 50 | Temp 98.6°F | Ht 65.0 in | Wt 165.2 lb

## 2018-01-23 DIAGNOSIS — R059 Cough, unspecified: Secondary | ICD-10-CM

## 2018-01-23 DIAGNOSIS — J019 Acute sinusitis, unspecified: Secondary | ICD-10-CM | POA: Diagnosis not present

## 2018-01-23 DIAGNOSIS — R05 Cough: Secondary | ICD-10-CM | POA: Diagnosis not present

## 2018-01-23 MED ORDER — FLUTICASONE PROPIONATE 50 MCG/ACT NA SUSP: 1.0000 | Freq: Two times a day (BID) | NASAL | 0 refills | Status: DC

## 2018-01-23 MED ORDER — HYDROCODONE-HOMATROPINE 5-1.5 MG PO TABS: ORAL_TABLET | ORAL | 0 refills | Status: DC

## 2018-01-23 NOTE — Patient Instructions (Signed)
You most likely have a viral infection that should resolve on its own over time (or this could be a flare of seasonal allergies as well).   Symptoms for a viral upper respiratory tract infection usually last 3-7 days but can stretch out to 2-3 weeks before you're feeling back to normal.  Your symptoms should not worsen after 7-10 days and if they truely do, please notify our office, as you may need antibiotics.  You can use over-the-counter afrin nasal spray for up to 3 days (NO longer than that) which will help acutely with nasal drainage/ congestion short term.   Also, sterile saline nasal rinses, such as Milta Deiters med or AYR sinus rinses, can be very helpful and should be done twice daily- especially throughout the allergy season.   Remember you should use distilled water or previously boiled water to do this.  Also please make sure you use Flonase 1 spray each nostril twice daily after the sinus rinses.  Do this for 3-4 weeks.  You can also use an over the counter cold and flu medication such as Tylenol Severe Cold and Sinus/Flu or Dayquil, Nyquil and the like, which will help with cough, congestion, headache/ pain, fevers/chills etc.  Please note, if you being treated for hypertension or have high blood pressure, you should be using the cold meds designated "HBP".    Unfortunately, antibiotics are not helpful for viral infections.  Wash your hands frequently, as you did not want to get those around you sick as well. Never sneeze or cough on others.  And you should not be going to school or work if you are running a temperature of 100.5 or more on two separate occasions.   Drink plenty of fluids and stay hydrated, especially if you are running fevers.  We don't know why, but chicken soup also helps, try it! :)

## 2018-01-23 NOTE — Progress Notes (Signed)
Acute Care Office visit  Assessment and plan:  1. Cough   2. Acute rhinosinusitis     - Viral vs Allergic vs Bacterial causes for pt's symptoms reveiwed.    - Supportive care and various OTC medications discussed in addition to any prescribed. - Call or RTC if new symptoms, or if no improvement or worse over next several days.   - Will consider ABX if sx continue past 10 days and worsening if not already given.    -Start medications as listed below for cough. Take OTC medications like dayquil and nyquil (tablets). Push fluids and get plenty of rest. Use neti pots or sinus rinses. Use flonase as listed below. Use sugar free cough drops and lozenges as-needed. Drink hot tea and eat soups. Instructed if symptoms worsen or she develops fevers or one-sided facial pain, call the office for further evaluation.   Meds ordered this encounter  Medications  . Hydrocodone-Homatropine 5-1.5 MG TABS    Sig: 1 tablet q 6-hour ONLY as needed cough    Dispense:  30 tablet    Refill:  0  . fluticasone (FLONASE) 50 MCG/ACT nasal spray    Sig: Place 1 spray into both nostrils 2 (two) times daily for 14 days.    Dispense:  1 g    Refill:  0    No orders of the defined types were placed in this encounter.   Gross side effects, risk and benefits, and alternatives of medications discussed with patient.  Patient is aware that all medications have potential side effects and we are unable to predict every sideeffect or drug-drug interaction that may occur.  Expresses verbal understanding and consents to current therapy plan and treatment regiment.   Education and routine counseling performed. Handouts provided.  Anticipatory guidance and routine counseling done re: condition, txmnt options and need for follow up. All questions of patient's were answered.  Return if symptoms worsen or fail to improve.  Please see AVS handed out to patient at the end of our visit for additional patient  instructions/ counseling done pertaining to today's office visit.  Note: This document was partially repared using Dragon voice recognition software and may include unintentional dictation errors.   This document serves as a record of services personally performed by Mellody Dance, DO. It was created on her behalf by Mayer Masker, a trained medical scribe. The creation of this record is based on the scribe's personal observations and the provider's statements to them.   I have reviewed the above medical documentation for accuracy and completeness and I concur.  Mellody Dance 02/06/18 6:27 PM   Subjective:    Chief Complaint  Patient presents with  . Sore Throat    HPI:  Pt presents with Sx for 8-9 days. Pt was seen last week here for the same issues.   C/o: Tickle in her throat, productive cough (all day),   Denies: fever, one-sided facial pain or ear pain.    For symptoms patient has tried:  lemon, honey, and warm drinks with mild relief. She is not taking OTC medications for her symptoms. She has also tried salt water gargles.  Overall getting:  A little better.  Patient Care Team    Relationship Specialty Notifications Start End  Mellody Dance, DO PCP - General Family Medicine  12/09/17   Druscilla Brownie, MD Consulting Physician Dermatology  01/11/18   Webb Laws, [place] Referring Physician Optometry  01/11/18   Celedonio Savage, PA-C Consulting Physician Dermatology  01/11/18    Comment: derm     Past medical history, Surgical history, Family history reviewed and noted below, Social history, Allergies, and Medications have been entered into the medical record, reviewed and changed as needed.   No Known Allergies  Review of Systems: - see above HPI for pertinent positives General:   No F/C, wt loss Pulm:   No DIB, pleuritic chest pain Card:  No CP, palpitations Abd:  No n/v/d or pain Ext:  No inc edema from baseline   Objective:   Blood pressure (!)  145/83, pulse (!) 50, temperature 98.6 F (37 C), height 5\' 5"  (1.651 m), weight 165 lb 3.2 oz (74.9 kg), SpO2 98 %. Body mass index is 27.49 kg/m. General: Well Developed, well nourished, appropriate for stated age.  Neuro: Alert and oriented x3, extra-ocular muscles intact, sensation grossly intact.  HEENT: Normocephalic, atraumatic, pupils equal round reactive to light, neck supple, no masses, no painful lymphadenopathy, TM's intact B/L, no acute findings. Nares- patent, clear d/c, OP- clear, mild erythema, No TTP sinuses. No abnormal findings. Skin: Warm and dry, no gross rash. Cardiac: RRR, S1 S2,  no murmurs rubs or gallops.  Respiratory: ECTA B/L and A/P, Not using accessory muscles, speaking in full sentences- unlabored. Vascular:  No gross lower ext edema, cap RF less 2 sec. Psych: No HI/SI, judgement and insight good, Euthymic mood. Full Affect.

## 2018-01-30 ENCOUNTER — Other Ambulatory Visit: Payer: Medicare Other

## 2018-01-30 DIAGNOSIS — E782 Mixed hyperlipidemia: Secondary | ICD-10-CM | POA: Diagnosis not present

## 2018-01-30 DIAGNOSIS — E1169 Type 2 diabetes mellitus with other specified complication: Secondary | ICD-10-CM

## 2018-01-30 DIAGNOSIS — I10 Essential (primary) hypertension: Secondary | ICD-10-CM

## 2018-01-30 DIAGNOSIS — E1159 Type 2 diabetes mellitus with other circulatory complications: Secondary | ICD-10-CM | POA: Diagnosis not present

## 2018-01-30 DIAGNOSIS — E559 Vitamin D deficiency, unspecified: Secondary | ICD-10-CM

## 2018-01-30 DIAGNOSIS — E1165 Type 2 diabetes mellitus with hyperglycemia: Secondary | ICD-10-CM

## 2018-01-30 DIAGNOSIS — E039 Hypothyroidism, unspecified: Secondary | ICD-10-CM

## 2018-01-30 DIAGNOSIS — I152 Hypertension secondary to endocrine disorders: Secondary | ICD-10-CM

## 2018-02-01 LAB — CBC WITH DIFFERENTIAL/PLATELET
Basophils Absolute: 0 10*3/uL (ref 0.0–0.2)
Basos: 1 %
EOS (ABSOLUTE): 0.2 10*3/uL (ref 0.0–0.4)
EOS: 2 %
HEMATOCRIT: 41.4 % (ref 34.0–46.6)
HEMOGLOBIN: 14.6 g/dL (ref 11.1–15.9)
Immature Grans (Abs): 0 10*3/uL (ref 0.0–0.1)
Immature Granulocytes: 0 %
LYMPHS ABS: 2.4 10*3/uL (ref 0.7–3.1)
Lymphs: 28 %
MCH: 31.9 pg (ref 26.6–33.0)
MCHC: 35.3 g/dL (ref 31.5–35.7)
MCV: 91 fL (ref 79–97)
MONOCYTES: 5 %
MONOS ABS: 0.4 10*3/uL (ref 0.1–0.9)
NEUTROS ABS: 5.6 10*3/uL (ref 1.4–7.0)
Neutrophils: 64 %
Platelets: 322 10*3/uL (ref 150–379)
RBC: 4.57 x10E6/uL (ref 3.77–5.28)
RDW: 12.9 % (ref 12.3–15.4)
WBC: 8.6 10*3/uL (ref 3.4–10.8)

## 2018-02-01 LAB — LIPID PANEL
Chol/HDL Ratio: 4.7 ratio — ABNORMAL HIGH (ref 0.0–4.4)
Cholesterol, Total: 219 mg/dL — ABNORMAL HIGH (ref 100–199)
HDL: 47 mg/dL (ref >39)
LDL CALC: 136 mg/dL — AB (ref 0–99)
TRIGLYCERIDES: 182 mg/dL — AB (ref 0–149)
VLDL CHOLESTEROL CAL: 36 mg/dL (ref 5–40)

## 2018-02-01 LAB — COMPREHENSIVE METABOLIC PANEL
ALBUMIN: 4.6 g/dL (ref 3.5–4.8)
ALK PHOS: 73 IU/L (ref 39–117)
ALT: 19 IU/L (ref 0–32)
AST: 16 IU/L (ref 0–40)
Albumin/Globulin Ratio: 1.8 (ref 1.2–2.2)
BILIRUBIN TOTAL: 0.3 mg/dL (ref 0.0–1.2)
BUN / CREAT RATIO: 16 (ref 12–28)
BUN: 16 mg/dL (ref 8–27)
CHLORIDE: 99 mmol/L (ref 96–106)
CO2: 22 mmol/L (ref 20–29)
CREATININE: 0.97 mg/dL (ref 0.57–1.00)
Calcium: 10 mg/dL (ref 8.7–10.3)
GFR calc non Af Amer: 59 mL/min/{1.73_m2} — ABNORMAL LOW (ref >59)
GFR, EST AFRICAN AMERICAN: 68 mL/min/{1.73_m2} (ref >59)
GLOBULIN, TOTAL: 2.5 g/dL (ref 1.5–4.5)
Glucose: 133 mg/dL — ABNORMAL HIGH (ref 65–99)
Potassium: 4.5 mmol/L (ref 3.5–5.2)
SODIUM: 139 mmol/L (ref 134–144)
TOTAL PROTEIN: 7.1 g/dL (ref 6.0–8.5)

## 2018-02-01 LAB — T4, FREE: FREE T4: 1.26 ng/dL (ref 0.82–1.77)

## 2018-02-01 LAB — VITAMIN D 25 HYDROXY (VIT D DEFICIENCY, FRACTURES): Vit D, 25-Hydroxy: 48.9 ng/mL (ref 30.0–100.0)

## 2018-02-01 LAB — TSH: TSH: 2.89 u[IU]/mL (ref 0.450–4.500)

## 2018-02-07 ENCOUNTER — Encounter: Payer: Self-pay | Admitting: Family Medicine

## 2018-02-07 ENCOUNTER — Ambulatory Visit (INDEPENDENT_AMBULATORY_CARE_PROVIDER_SITE_OTHER): Payer: Medicare Other | Admitting: Family Medicine

## 2018-02-07 VITALS — BP 160/88 | HR 58 | Ht 65.0 in | Wt 165.1 lb

## 2018-02-07 DIAGNOSIS — Z8249 Family history of ischemic heart disease and other diseases of the circulatory system: Secondary | ICD-10-CM | POA: Diagnosis not present

## 2018-02-07 DIAGNOSIS — I10 Essential (primary) hypertension: Secondary | ICD-10-CM

## 2018-02-07 DIAGNOSIS — Z820 Family history of epilepsy and other diseases of the nervous system: Secondary | ICD-10-CM

## 2018-02-07 DIAGNOSIS — Z833 Family history of diabetes mellitus: Secondary | ICD-10-CM

## 2018-02-07 DIAGNOSIS — Z823 Family history of stroke: Secondary | ICD-10-CM | POA: Diagnosis not present

## 2018-02-07 DIAGNOSIS — E1169 Type 2 diabetes mellitus with other specified complication: Secondary | ICD-10-CM | POA: Diagnosis not present

## 2018-02-07 DIAGNOSIS — I152 Hypertension secondary to endocrine disorders: Secondary | ICD-10-CM

## 2018-02-07 DIAGNOSIS — E782 Mixed hyperlipidemia: Secondary | ICD-10-CM | POA: Diagnosis not present

## 2018-02-07 DIAGNOSIS — E039 Hypothyroidism, unspecified: Secondary | ICD-10-CM | POA: Diagnosis not present

## 2018-02-07 DIAGNOSIS — E1159 Type 2 diabetes mellitus with other circulatory complications: Secondary | ICD-10-CM

## 2018-02-07 DIAGNOSIS — E559 Vitamin D deficiency, unspecified: Secondary | ICD-10-CM | POA: Diagnosis not present

## 2018-02-07 DIAGNOSIS — E1165 Type 2 diabetes mellitus with hyperglycemia: Secondary | ICD-10-CM

## 2018-02-07 HISTORY — DX: Family history of stroke: Z82.3

## 2018-02-07 HISTORY — DX: Family history of ischemic heart disease and other diseases of the circulatory system: Z82.49

## 2018-02-07 HISTORY — DX: Family history of epilepsy and other diseases of the nervous system: Z82.0

## 2018-02-07 HISTORY — DX: Family history of diabetes mellitus: Z83.3

## 2018-02-07 LAB — POCT UA - MICROALBUMIN
Albumin/Creatinine Ratio, Urine, POC: <30
Creatinine, POC: 100 mg/dL
MICROALBUMIN (UR) POC: 10 mg/L

## 2018-02-07 MED ORDER — LOSARTAN POTASSIUM-HCTZ 50-12.5 MG PO TABS: 1.0000 | ORAL_TABLET | Freq: Every day | ORAL | 3 refills | Status: DC

## 2018-02-07 NOTE — Progress Notes (Signed)
Assessment and plan:  1. Type 2 diabetes mellitus with hyperglycemia, without long-term current use of insulin (Jill Daugherty)   2. Family history of early CAD- dad in early 60's   3. Family history of cerebrovascular accident (CVA) in mother- in her 91's   4. Family history of diabetes mellitus (DM)- PGM- in 31's   5. Family history of Alzheimer's disease- Mom in her 31   6. Hypertension associated with diabetes (Jill Daugherty)   7. Mixed diabetic hyperlipidemia associated with type 2 diabetes mellitus (Jill Daugherty)   8. Hypothyroidism, unspecified type   9. Vitamin D deficiency      Education and routine counseling performed. Handouts provided.  1. Blood Counts - All of her levels are perfect.  2. Vitamin D Deficiency - Reviewed that ideal level is between 50-60, and hers is at 41. - Patient should continue her medications as prescribed.  3. Thyroid - Levels are within normal range.  4. Lipids - LDL = 136  - Patient hasn't been taking her cholesterol medication for 2 weeks.  - Reviewed that she should take her atorvastatin regularly, each evening before bedtime.  - Advised the patient that because her LDL cholesterol is high, she should avoid eating saturated and trans fats.  Advised the patient to stop eating fried shrimp, and avoid fried foods in general - limiting her fried food intake to one fried food a week.  Patient should also limit her intake of red meat, including beef, pork, and especially fatty meats.  Advised her to eat pork or beef only once a week.  - Extensive education provided to the patient today on avoiding bad cholesterol in the diet.  - Patient knows she can visit a nutritionist if she desires, but declines at this time.  5. Diabetes Mellitus - Reviewed that her goal blood sugar should be less than 130.  - Does not check her blood pressure at home.  6. HTN - Patient should try to take her blood pressure  regularly at home with a cuff.  Blood pressure goal should be consistently in the 130's-low 140's-150's/70's-80's.  - If her blood pressure is not consistently at goal at home, please make a follow-up appointment sooner than planned.  - Patient knows to discontinue her previous HCTZ pill and begin taking losartan-HCTZ combination pill to replace it.  Reviewed the importance of this medicine given her status as a diabetic  Advised the patient that this new medication will help get better control of her blood pressure, as well as protect her kidneys.  7. Kidney Function - Reviewed the importance of water intake with the patient.  - she should be drinking at least 80 ounces of water per day, minimum.  This will minimize the side effects of her medicines in general.  - eEectrolyte levels look great.   - Calcium levels look great.  She should continue her supplements.  - Liver enzymes look fantastic.  8. Keeping up with Medications - In the future, patient should obtain a pill box that has a morning, afternoon, and evening slot for each day.  Patient should put out all her pills for the week on Sundays.    9. General Health Maintenance - Advised the patient to continue her habit of exercising 15 minutes per day to improve her health.  Reviewed the fact that exercise helps with mind, mood, blood sugars, osteoporosis, and that it's one of the single most important things the patient can do to improve her  health and well being.    - Patient may begin striving toward her goals with 15 minutes of activity daily.  Recommended that the patient eventually strive for at least 150 minutes of cardio per week according to guidelines established by the [facility].   - Healthy dietary habits encouraged.  Patient should watch what she eats, eat "less of the bad stuff, and more of the good stuff."  She should limit carbohydrate intake, especially simple sugars.  Diet should contain high amounts of lean protein,  vegetables, and fruits.  Mediterranean Diet recommended.  - Handout provided on the [place] Diet.  - Patient should make sure to consume adequate amounts of water - on an average day, at least half of body weight in oz of water per day.  10. Follow-Up - In 6 weeks, we will follow up with the patient.  - She will bring her blood pressure log with her to the next appointment. - At that time, we will review her blood pressure since changing from HCTZ to losartan-HCTZ.  11. Time Pt was in the office today for 40+ minutes, with over 50% time spent in face to face counseling of patients various medical conditions, treatment plans of those medical conditions including medicine management and lifestyle modification, strategies to improve health and well being; and in coordination of care. SEE ABOVE FOR DETAILS    Orders Placed This Encounter  Procedures  . POCT UA - Microalbumin    Meds ordered this encounter  Medications  . losartan-hydrochlorothiazide (HYZAAR) 50-12.5 MG tablet    Sig: Take 1 tablet by mouth daily.    Dispense:  90 tablet    Refill:  3     Return for 6wks- f/up BP- since changing from hctz to losartan-hctz. . bring BP log.   Anticipatory guidance and routine counseling done re: condition, txmnt options and need for follow up. All questions of patient's were answered.   Gross side effects, risk and benefits, and alternatives of medications discussed with patient.  Patient is aware that all medications have potential side effects and we are unable to predict every sideeffect or drug-drug interaction that may occur.  Expresses verbal understanding and consents to current therapy plan and treatment regiment.  Please see AVS handed out to patient at the end of our visit for additional patient instructions/ counseling done pertaining to today's office visit.  Note: This document was prepared using Dragon voice recognition software and may include unintentional  dictation errors.  This document serves as a record of services personally performed by Mellody Dance, DO. It was created on her behalf by Toni Amend, a trained medical scribe. The creation of this record is based on the scribe's personal observations and the provider's statements to them.   I have reviewed the above medical documentation for accuracy and completeness and I concur.  Mellody Dance 03/02/18 4:20 PM   ----------------------------------------------------------------------------------------------------------------------  Subjective:   CC:   Jill Daugherty is a 72 y.o. female who presents to [place] at [facility] today for review and discussion of recent bloodwork that was done.  1. All recent blood work that we ordered was reviewed with patient today.  Patient was counseled on all abnormalities and we discussed dietary and lifestyle changes that could help those values (also medications when appropriate).  Extensive health counseling performed and all patient's concerns/ questions were addressed.   Patient fully reviewed her medication list prior to the appointment today and ensured that it was up  to date.  She takes 81 mg of aspirin daily - was recommended to do this by Dr. Ubaldo Glassing, her retired Materials engineer.  Vitamin D Deficiency Patient continues taking 2 capsules of Vitamin D daily - 2000 units.  She also takes calcium.  Cholesterol She has not been taking her atorvastatin - she has been forgetting to take this medication. She hasn't taken it for the past 2 weeks, and notes that she forgets to take it very often.  She likes eating fried fish and shrimp at [employer].  She also eats fried chicken on her salad. She eats a pork tenderloin many mornings for breakfast, and notes often eating sausage, bacon, and beef.  Diabetes At home, patient reports that her fasting blood sugar is at 124.  HTN Patient does not check her blood pressure at  home. She takes her blood pressure medications every day.  Exercise She goes to [employer] and walks in rounds every day.  She tries to walk for 15 minutes every day. She walked around for about 15 minutes today already.  She will also walk at [employer] park from time to time.  Patient emphasizes that she loves to walk.  She knows that she's been gaining weight, and plans on working to combat this.  Patient is honest about the fact that her diet contains a lot of fried foods.  Family History Discussed Today Patient may continue taking her aspirin daily, due to her family history of early CAD. Her mother had a stroke in her 56's and later had dementia/Alzheimer's. First signs of Alzheimer's showed up in her mother in her 47's. Father had a heart attack.  She believes he had heart disease in his 2's. Paternal grandmother was a diabetic - in 31's maybe, roughly. Patient notes that cancer also runs in her family. Notes that cancer runs in her family, but no breast, GI, or colon cancers.   Wt Readings from Last 3 Encounters:  02/07/18 165 lb 1.6 oz (74.9 kg)  01/23/18 165 lb 3.2 oz (74.9 kg)  01/17/18 163 lb 3.2 oz (74 kg)   BP Readings from Last 3 Encounters:  02/07/18 (!) 160/88  01/23/18 (!) 145/83  01/17/18 (!) 150/66   Pulse Readings from Last 3 Encounters:  02/07/18 (!) 58  01/23/18 (!) 50  01/17/18 (!) 54   BMI Readings from Last 3 Encounters:  02/07/18 27.47 kg/m  01/23/18 27.49 kg/m  01/17/18 27.16 kg/m     Patient Care Team    Relationship Specialty Notifications Start End  Mellody Dance, DO PCP - General Family Medicine  12/09/17   Druscilla Brownie, MD Consulting Physician Dermatology  01/11/18   Webb Laws, [employer] Referring Physician Optometry  01/11/18   Celedonio Savage, PA-C Consulting Physician Dermatology  01/11/18    Comment: derm     Full medical history updated and reviewed in the office today  Patient Active Problem List   Diagnosis Date  Noted  . Hypertension associated with diabetes ([place]) 01/11/2018    Priority: High  . Mixed diabetic hyperlipidemia associated with type 2 diabetes mellitus ([place]) 01/11/2018    Priority: High  . DM2 (diabetes mellitus, type 2) ([place]) 06/23/2015    Priority: High  . Gout 06/23/2015    Priority: Medium  . GERD (gastroesophageal reflux disease) 06/23/2015    Priority: Medium  . Hypothyroidism 06/21/2013    Priority: Medium  . Family history of early CAD- dad in early 60's 02/07/2018    Priority: Low  . Family history  of cerebrovascular accident (CVA) in mother- in her 22's 02/07/2018    Priority: Low  . Family history of diabetes mellitus (DM)- PGM- in 40's 02/07/2018    Priority: Low  . Family history of Alzheimer's disease- Mom in her 54 02/07/2018    Priority: Low  . Environmental and seasonal allergies 01/11/2018    Priority: Low  . Vitamin D deficiency 01/11/2018    Priority: Low  . OAB (overactive bladder) 06/23/2015  . HTN (hypertension) 06/21/2013  . Hyperlipidemia 06/21/2013    Past Medical History:  Diagnosis Date  . History of chicken pox   . History of frequent urinary tract infections   . Hyperlipidemia   . Hypertension   . Overactive bladder   . Thyroid disease     No past surgical history on file.  Social History   Tobacco Use  . Smoking status: Never Smoker  . Smokeless tobacco: Never Used  Substance Use Topics  . Alcohol use: No    Alcohol/week: 0.0 oz    Family Hx: Family History  Problem Relation Age of Onset  . Heart disease Father   . Diabetes Paternal Grandmother   . Cancer Paternal Grandmother   . Breast cancer Neg Hx      Medications: Current Outpatient Medications  Medication Sig Dispense Refill  . allopurinol (ZYLOPRIM) 100 MG tablet Take 1 tablet by mouth daily.    Marland Kitchen amLODipine (NORVASC) 5 MG tablet Take 1 tablet by mouth daily.    Marland Kitchen aspirin EC 81 MG tablet Take 81 mg by mouth daily.    Marland Kitchen atenolol (TENORMIN) 100 MG tablet Take  100 mg by mouth daily.     Marland Kitchen atorvastatin (LIPITOR) 20 MG tablet Take 20 mg by mouth daily.     . Calcium Carbonate (CALCIUM 600 PO) Take by mouth.    . Cholecalciferol (VITAMIN D3) 1000 units CAPS Take 1 capsule by mouth.    . hydrochlorothiazide (MICROZIDE) 12.5 MG capsule Take 12.5 mg by mouth daily.     . Hydrocodone-Homatropine 5-1.5 MG TABS 1 tablet q 6-hour ONLY as needed cough 30 tablet 0  . levothyroxine (SYNTHROID, LEVOTHROID) 50 MCG tablet Take 1 tablet by mouth daily.    . metFORMIN (GLUCOPHAGE-XR) 500 MG 24 hr tablet Take 1 tablet by mouth daily.    . ranitidine (ZANTAC) 150 MG capsule Take 150 mg by mouth 2 (two) times daily.    . fluticasone (FLONASE) 50 MCG/ACT nasal spray Place 1 spray into both nostrils 2 (two) times daily for 14 days. 1 g 0  . losartan-hydrochlorothiazide (HYZAAR) 50-12.5 MG tablet Take 1 tablet by mouth daily. 90 tablet 3   No current facility-administered medications for this visit.     Allergies:  No Known Allergies   Review of Systems: General:   No F/C, wt loss Pulm:   No DIB, SOB, pleuritic chest pain Card:  No CP, palpitations Abd:  No n/v/d or pain Ext:  No inc edema from baseline  Objective:  Blood pressure (!) 160/88, pulse (!) 58, height 5\' 5"  (1.651 m), weight 165 lb 1.6 oz (74.9 kg), SpO2 97 %. Body mass index is 27.47 kg/m. Gen:   Well NAD, A and O *3 HEENT:    [place]/AT, EOMI,  MMM Lungs:   Normal work of breathing. CTA B/L, no Wh, rhonchi Heart:   RRR, S1, S2 WNL's, no MRG Abd:   No gross distention Exts:    warm, pink,  Brisk capillary refill, warm and well perfused.  Psych:    No HI/SI, judgement and insight good, Euthymic mood. Full Affect.   Recent Results (from the past 2160 hour(s))  POCT glycosylated hemoglobin (Hb A1C)     Status: Abnormal   Collection Time: 01/11/18  5:11 PM  Result Value Ref Range   Hemoglobin A1C 6.3   POCT rapid strep A     Status: Normal   Collection Time: 01/17/18  2:27 PM  Result Value Ref  Range   Rapid Strep A Screen Negative Negative  VITAMIN D 25 Hydroxy (Vit-D Deficiency, Fractures)     Status: None   Collection Time: 01/30/18  8:53 AM  Result Value Ref Range   Vit D, 25-Hydroxy 48.9 30.0 - 100.0 ng/mL    Comment: Vitamin D deficiency has been defined by the [place] practice guideline as a level of serum 25-OH vitamin D less than 20 ng/mL (1,2). The Endocrine Society went on to further define vitamin D insufficiency as a level between 21 and 29 ng/mL (2). 1. IOM (Institute of Medicine). 2010. Dietary reference    intakes for calcium and D. [place]: The    [employer]. 2. Holick MF, [place], Bischoff-Ferrari HA, et al.    Evaluation, treatment, and prevention of vitamin D    deficiency: an Endocrine Society clinical practice    guideline. JCEM. 2011 Jul; 96(7):1911-30.   TSH     Status: None   Collection Time: 01/30/18  8:53 AM  Result Value Ref Range   TSH 2.890 0.450 - 4.500 uIU/mL  T4, free     Status: None   Collection Time: 01/30/18  8:53 AM  Result Value Ref Range   Free T4 1.26 0.82 - 1.77 ng/dL  Lipid panel     Status: Abnormal   Collection Time: 01/30/18  8:53 AM  Result Value Ref Range   Cholesterol, Total 219 (H) 100 - 199 mg/dL   Triglycerides 182 (H) 0 - 149 mg/dL   HDL 47 >39 mg/dL   VLDL Cholesterol Cal 36 5 - 40 mg/dL   LDL Calculated 136 (H) 0 - 99 mg/dL   Chol/HDL Ratio 4.7 (H) 0.0 - 4.4 ratio    Comment:                                   T. Chol/HDL Ratio                                             Men  Women                               1/2 Avg.Risk  3.4    3.3                                   Avg.Risk  5.0    4.4                                2X Avg.Risk  9.6    7.1  3X Avg.Risk 23.4   11.0   Comprehensive metabolic panel     Status: Abnormal   Collection Time: 01/30/18  8:53 AM  Result Value Ref Range   Glucose 133 (H) 65 - 99 mg/dL   BUN  16 8 - 27 mg/dL   Creatinine, Ser 0.97 0.57 - 1.00 mg/dL   GFR calc non Af Amer 59 (L) >59 mL/min/1.73   GFR calc Af Amer 68 >59 mL/min/1.73   BUN/Creatinine Ratio 16 12 - 28   Sodium 139 134 - 144 mmol/L   Potassium 4.5 3.5 - 5.2 mmol/L   Chloride 99 96 - 106 mmol/L   CO2 22 20 - 29 mmol/L   Calcium 10.0 8.7 - 10.3 mg/dL   Total Protein 7.1 6.0 - 8.5 g/dL   Albumin 4.6 3.5 - 4.8 g/dL   Globulin, Total 2.5 1.5 - 4.5 g/dL   Albumin/Globulin Ratio 1.8 1.2 - 2.2   Bilirubin Total 0.3 0.0 - 1.2 mg/dL   Alkaline Phosphatase 73 39 - 117 IU/L   AST 16 0 - 40 IU/L   ALT 19 0 - 32 IU/L  CBC with Differential/Platelet     Status: None   Collection Time: 01/30/18  8:53 AM  Result Value Ref Range   WBC 8.6 3.4 - 10.8 x10E3/uL   RBC 4.57 3.77 - 5.28 x10E6/uL   Hemoglobin 14.6 11.1 - 15.9 g/dL   Hematocrit 41.4 34.0 - 46.6 %   MCV 91 79 - 97 fL   MCH 31.9 26.6 - 33.0 pg   MCHC 35.3 31.5 - 35.7 g/dL   RDW 12.9 12.3 - 15.4 %   Platelets 322 150 - 379 x10E3/uL   Neutrophils 64 Not Estab. %   Lymphs 28 Not Estab. %   Monocytes 5 Not Estab. %   Eos 2 Not Estab. %   Basos 1 Not Estab. %   Neutrophils Absolute 5.6 1.4 - 7.0 x10E3/uL   Lymphocytes Absolute 2.4 0.7 - 3.1 x10E3/uL   Monocytes Absolute 0.4 0.1 - 0.9 x10E3/uL   EOS (ABSOLUTE) 0.2 0.0 - 0.4 x10E3/uL   Basophils Absolute 0.0 0.0 - 0.2 x10E3/uL   Immature Granulocytes 0 Not Estab. %   Immature Grans (Abs) 0.0 0.0 - 0.1 x10E3/uL  POCT UA - Microalbumin     Status: Normal   Collection Time: 02/07/18 11:19 AM  Result Value Ref Range   Microalbumin Ur, POC 10 mg/L   Creatinine, POC 100 mg/dL   Albumin/Creatinine Ratio, Urine, POC <30

## 2018-02-07 NOTE — Patient Instructions (Addendum)
-In the future Ms. Donella, I want you to get a pillbox that has a morning and afternoon and evening slots for each day.  On Sundays you are going to put all your pills out for the week.  It is important that you take the atorvastatin calcium every evening before bedtime.  It is also important you try to drink at least 80 ounces of water per day.  This will help prevent any side effects of the medicines to your kidneys or liver etc.  I want you to try to check your blood pressure at home with a cough.  Your goal blood pressure should be consistently in the low 140s-130s over 80s-70s.  Within the next couple of weeks if your blood pressure is not consistently this at home, please make a follow-up appointment with me sooner than planned.  Also please make sure you get rid of the hydrochlorothiazide plain medicine at home and switch to the new losartan-hydrochlorothiazide medicine I called in for you today.      Please realize, EXERCISE IS MEDICINE!  -  American Heart Association [facility]) guidelines for exercise : If you are in good health, without any medical conditions, you should engage in 150 minutes of moderate intensity aerobic activity per week.  This means you should be huffing and puffing throughout your workout.   Engaging in regular exercise will improve brain function and memory, as well as improve mood, boost immune system and help with weight management.  As well as the other, more well-known effects of exercise such as decreasing blood sugar levels, decreasing blood pressure,  and decreasing bad cholesterol levels/ increasing good cholesterol levels.     -  The AHA strongly endorses consumption of a diet that contains a variety of foods from all the food categories with an emphasis on fruits and vegetables; fat-free and low-fat dairy products; cereal and grain products; legumes and nuts; and fish, poultry, and/or extra lean meats.    Excessive food intake, especially of foods high in saturated  and trans fats, sugar, and salt, should be avoided.    Adequate water intake of roughly 1/2 of your weight in pounds, should equal the ounces of water per day you should drink.  So for instance, if you're 200 pounds, that would be 100 ounces of water per day.         Mediterranean Diet  Why follow it? Research shows. . Those who follow the Mediterranean diet have a reduced risk of heart disease  . The diet is associated with a reduced incidence of Parkinson's and Alzheimer's diseases . People following the diet may have longer life expectancies and lower rates of chronic diseases  . The Dietary Guidelines for Americans recommends the Mediterranean diet as an eating plan to promote health and prevent disease  What Is the Mediterranean Diet?  . Healthy eating plan based on typical foods and recipes of Mediterranean-style cooking . The diet is primarily a plant based diet; these foods should make up a majority of meals   Starches - Plant based foods should make up a majority of meals - They are an important sources of vitamins, minerals, energy, antioxidants, and fiber - Choose whole grains, foods high in fiber and minimally processed items  - Typical grain sources include wheat, oats, barley, corn, brown rice, bulgar, farro, millet, polenta, couscous  - Various types of beans include chickpeas, lentils, fava beans, black beans, white beans   Fruits  Veggies - Large quantities of antioxidant  rich fruits & veggies; 6 or more servings  - Vegetables can be eaten raw or lightly drizzled with oil and cooked  - Vegetables common to the traditional Mediterranean Diet include: artichokes, arugula, beets, broccoli, brussel sprouts, cabbage, carrots, celery, collard greens, cucumbers, eggplant, kale, leeks, lemons, lettuce, mushrooms, okra, onions, peas, peppers, potatoes, pumpkin, radishes, rutabaga, shallots, spinach, sweet potatoes, turnips, zucchini - Fruits common to the Mediterranean Diet include:  apples, apricots, avocados, cherries, clementines, dates, figs, grapefruits, grapes, melons, nectarines, oranges, peaches, pears, pomegranates, strawberries, tangerines  Fats - Replace butter and margarine with healthy oils, such as olive oil, canola oil, and tahini  - Limit nuts to no more than a handful a day  - Nuts include walnuts, almonds, pecans, pistachios, pine nuts  - Limit or avoid candied, honey roasted or heavily salted nuts - Olives are central to the [employer] - can be eaten whole or used in a variety of dishes   Meats Protein - Limiting red meat: no more than a few times a month - When eating red meat: choose lean cuts and keep the portion to the size of deck of cards - Eggs: approx. 0 to 4 times a week  - Fish and lean poultry: at least 2 a week  - Healthy protein sources include, chicken, [place], lean beef, lamb - Increase intake of seafood such as tuna, salmon, trout, mackerel, shrimp, scallops - Avoid or limit high fat processed meats such as sausage and bacon  Dairy - Include moderate amounts of low fat dairy products  - Focus on healthy dairy such as fat free yogurt, skim milk, low or reduced fat cheese - Limit dairy products higher in fat such as whole or 2% milk, cheese, ice cream  Alcohol - Moderate amounts of red wine is ok  - No more than 5 oz daily for women (all ages) and men older than age 38  - No more than 10 oz of wine daily for men younger than 27  Other - Limit sweets and other desserts  - Use herbs and spices instead of salt to flavor foods  - Herbs and spices common to the traditional Mediterranean Diet include: basil, bay leaves, chives, cloves, cumin, fennel, garlic, lavender, marjoram, mint, oregano, parsley, pepper, rosemary, sage, savory, sumac, tarragon, thyme   It's not just a diet, it's a lifestyle:  . The Mediterranean diet includes lifestyle factors typical of those in the region  . Foods, drinks and meals are best eaten with others and  savored . Daily physical activity is important for overall good health . This could be strenuous exercise like running and aerobics . This could also be more leisurely activities such as walking, housework, yard-work, or taking the stairs . Moderation is the key; a balanced and healthy diet accommodates most foods and drinks . Consider portion sizes and frequency of consumption of certain foods   Meal Ideas & Options:  . Breakfast:  o Whole wheat toast or whole wheat English muffins with peanut butter & hard boiled egg o Steel cut oats topped with apples & cinnamon and skim milk  o Fresh fruit: banana, strawberries, melon, berries, peaches  o Smoothies: strawberries, bananas, greek yogurt, peanut butter o Low fat greek yogurt with blueberries and granola  o Egg white omelet with spinach and mushrooms o Breakfast couscous: whole wheat couscous, apricots, skim milk, cranberries  . Sandwiches:  o Hummus and grilled vegetables (peppers, zucchini, squash) on whole wheat bread   o Grilled chicken  on whole wheat pita with lettuce, tomatoes, cucumbers or tzatziki  o Tuna salad on whole wheat bread: tuna salad made with greek yogurt, olives, red peppers, capers, green onions o Garlic rosemary lamb pita: lamb sauted with garlic, rosemary, salt & pepper; add lettuce, cucumber, greek yogurt to pita - flavor with lemon juice and black pepper  . Seafood:  o Mediterranean grilled salmon, seasoned with garlic, basil, parsley, lemon juice and black pepper o Shrimp, lemon, and spinach whole-grain pasta salad made with low fat greek yogurt  o Seared scallops with lemon orzo  o Seared tuna steaks seasoned salt, pepper, coriander topped with tomato mixture of olives, tomatoes, olive oil, minced garlic, parsley, green onions and cappers  . Meats:  o Herbed greek chicken salad with kalamata olives, cucumber, feta  o Red bell peppers stuffed with spinach, bulgur, lean ground beef (or lentils) & topped with feta    o Kebabs: skewers of chicken, tomatoes, onions, zucchini, squash  o [place] burgers: made with red onions, mint, dill, lemon juice, feta cheese topped with roasted red peppers . Vegetarian o Cucumber salad: cucumbers, artichoke hearts, celery, red onion, feta cheese, tossed in olive oil & lemon juice  o Hummus and whole grain pita points with a greek salad (lettuce, tomato, feta, olives, cucumbers, red onion) o Lentil soup with celery, carrots made with vegetable broth, garlic, salt and pepper  o Tabouli salad: parsley, bulgur, mint, scallions, cucumbers, tomato, radishes, lemon juice, olive oil, salt and pepper.

## 2018-02-08 ENCOUNTER — Encounter: Payer: Self-pay | Admitting: Family Medicine

## 2018-02-09 LAB — HM DIABETES EYE EXAM

## 2018-02-20 ENCOUNTER — Telehealth: Payer: Self-pay

## 2018-02-20 NOTE — Telephone Encounter (Signed)
Error

## 2018-02-21 ENCOUNTER — Telehealth: Payer: Self-pay

## 2018-02-21 NOTE — Telephone Encounter (Signed)
Patient's husband called and states that patient's blood pressure has been running lower than normal - readings have been in the 110s/70s.  Patient denies any headaches, dizziness, SOB or chest pains.  Spoke to Dr. Raliegh Scarlet about the patient and she instructed that patient needs to continue to monitor BP readings and if any symptoms as listed about above patient needs to be seen on the office. If any symptoms are noticed outside of our office hours patient needs to got to the ED or [facility] for treatment. Called patient multiple times to advise patient of Dr. Hershal Coria advise and I have not been able to reach the patient on the number in the chart.  MPulliam, CMA/RT(R)

## 2018-03-18 IMAGING — DX DG ABDOMEN 1V
2 series · 2 of 2 positions shown · non-contrast
Comparison: 10/27/2015

CLINICAL DATA: Lower abdominal pain

EXAM:
ABDOMEN - 1 VIEW

[abdomen kub (1 of 2)]
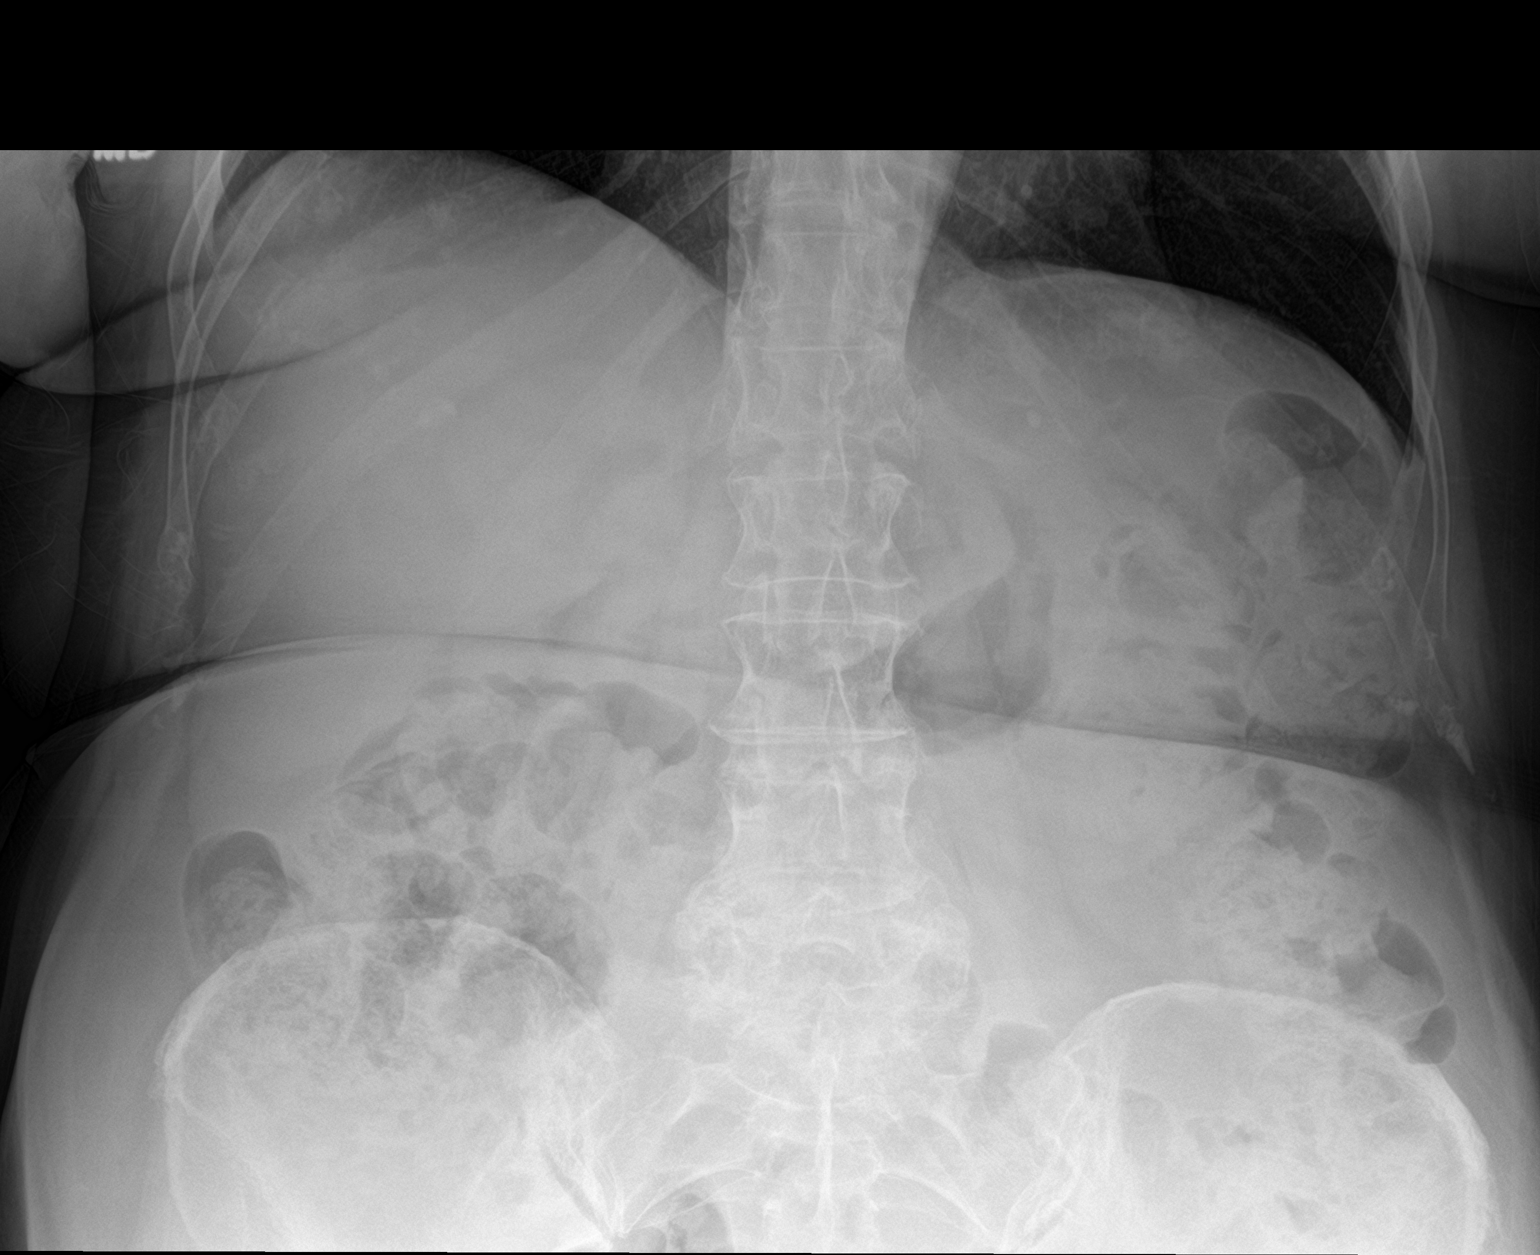

[abdomen kub (2 of 2)]
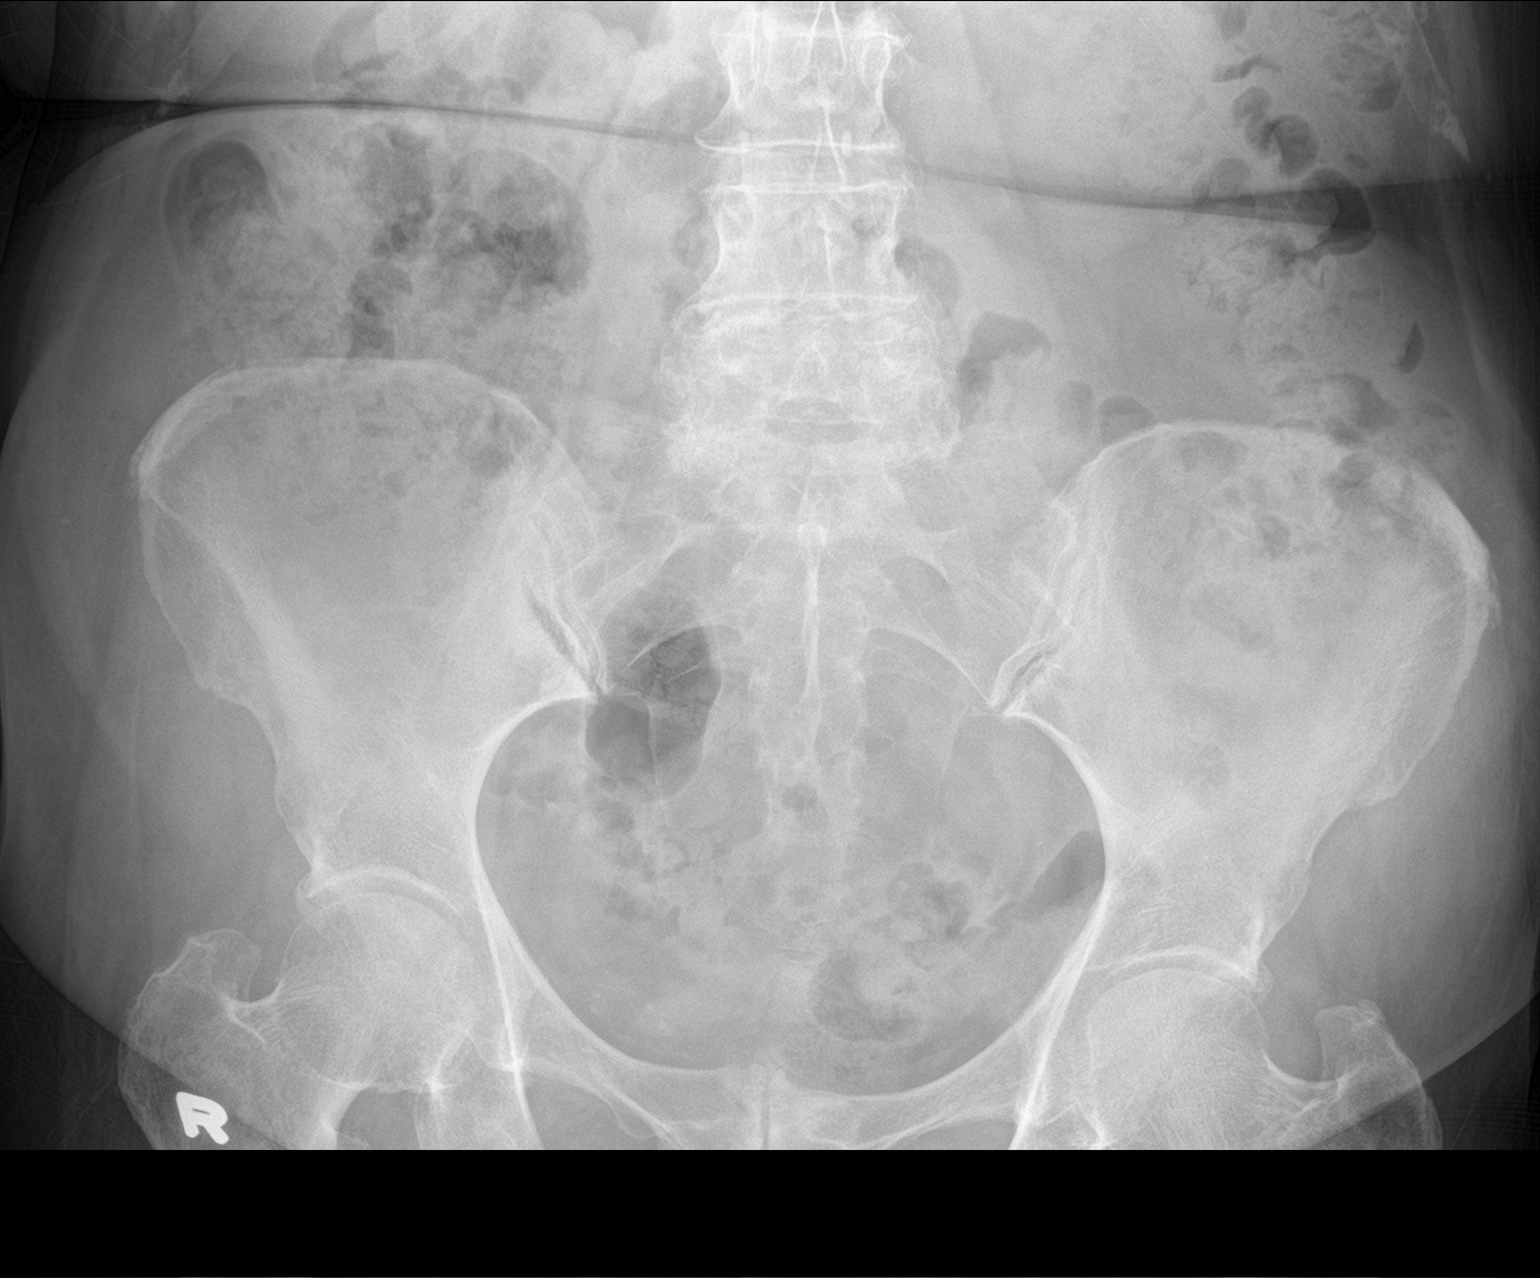

[2 of 2 positions shown; findings below may reference images not displayed]

FINDINGS: Scattered large and small bowel gas is noted. Fecal material is
noted throughout the colon similar to that seen on the prior exam
consistent with mild constipation. No free air is seen. No abnormal
mass or abnormal calcifications are noted. Degenerative changes of
lumbar spine are seen.
IMPRESSION: Mild constipation stable from the previous exam. No new focal
abnormality is noted.

## 2018-03-20 ENCOUNTER — Encounter: Payer: Self-pay | Admitting: Family Medicine

## 2018-03-20 ENCOUNTER — Ambulatory Visit (INDEPENDENT_AMBULATORY_CARE_PROVIDER_SITE_OTHER): Payer: Medicare Other | Admitting: Family Medicine

## 2018-03-20 VITALS — BP 132/72 | HR 55 | Resp 16 | Wt 162.0 lb

## 2018-03-20 DIAGNOSIS — Z9111 Patient's noncompliance with dietary regimen: Secondary | ICD-10-CM

## 2018-03-20 DIAGNOSIS — E1159 Type 2 diabetes mellitus with other circulatory complications: Secondary | ICD-10-CM | POA: Diagnosis not present

## 2018-03-20 DIAGNOSIS — Z9114 Patient's other noncompliance with medication regimen: Secondary | ICD-10-CM

## 2018-03-20 DIAGNOSIS — I10 Essential (primary) hypertension: Secondary | ICD-10-CM

## 2018-03-20 DIAGNOSIS — Z91199 Patient's noncompliance with other medical treatment and regimen due to unspecified reason: Secondary | ICD-10-CM | POA: Insufficient documentation

## 2018-03-20 DIAGNOSIS — Z91119 Patient's noncompliance with dietary regimen due to unspecified reason: Secondary | ICD-10-CM | POA: Insufficient documentation

## 2018-03-20 DIAGNOSIS — I152 Hypertension secondary to endocrine disorders: Secondary | ICD-10-CM

## 2018-03-20 NOTE — Progress Notes (Signed)
Impression and Recommendations:    1. Hypertension associated with diabetes ([place])   2. Noncompliance with diet and medication regimen     1. Hypertension: -BP has been well-controlled at home despite being elevated in office today. From last OV, we stopped losartan and added losartan-hctz combo pill.  -She is tolerating her meds well without complication. She denies any side effects from her new medications. Pt asymptomatic. Continue meds as listed below. Keep a log and bring this into next OV.  -Told pt if she starts to have symptoms of low BPs (110s/50s), we will potentially make a change to her atenolol in the future.   2. Noncompliance -Confirm which medicines you are taking at home and let us know if there are any discrepancies.  -take all of your medications as written on your medication list.   -Drink adequate amounts of water per day, equal to half of your body weight in oz (or 5 bottles of water per day).  -Recommend walking regularly.    Education and routine counseling performed. Handouts provided.  Orders Placed This Encounter  Procedures  . CBC with Differential/Platelet  . Comprehensive metabolic panel  . Hemoglobin A1c  . Lipid panel  . Magnesium    No orders of the defined types were placed in this encounter.    The patient was counseled, risk factors were discussed, anticipatory guidance given.  Gross side effects, risk and benefits, and alternatives of medications discussed with patient.  Patient is aware that all medications have potential side effects and we are unable to predict every side effect or drug-drug interaction that may occur.  Expresses verbal understanding and consents to current therapy plan and treatment regimen.  Return for f/up 05/11/2018 for your diabetes, BP, Chol- come fasting 2 d prior.  Please see AVS handed out to patient at the end of our visit for further patient instructions/ counseling done pertaining to today's office  visit.    Note: This document was prepared using Dragon voice recognition software and may include unintentional dictation errors.  This document serves as a record of services personally performed by Mellody Dance, DO. It was created on her behalf by Mayer Masker, a trained medical scribe. The creation of this record is based on the scribe's personal observations and the provider's statements to them.   I have reviewed the above medical documentation for accuracy and completeness and I concur.  Mellody Dance 03/20/18 5:09 PM   Subjective:    HPI: Jill Daugherty is a 72 y.o. female who presents to [place] at [facility] today for follow up for HTN.    HTN:  From last OV, we changed from losartan to combination losartan-hctz pill.   -  Her blood pressure has been controlled at home.   120-130/60-70 with unknown pulse average, but the one reading was 55 bpm.   - Patient reports good compliance with blood pressure medications. Yes.   - Denies medication S-E   - Smoking Status noted   - She denies new onset of: chest pain, exercise intolerance, shortness of breath, dizziness, visual changes, headache, lower extremity swelling or claudication.    She does not like the mediterranean diet, but she is watching what she eats. She only drinks 2 bottles of water per day.   Abdomen She complains of a hernia on her abdomen that she has had before. She denies any pain.  Colonoscopy She had one colonoscopy years prior but does not want  to have another one again. She states she does not like any aspect of the procedure.   Last 3 blood pressure readings in our office are as follows: BP Readings from Last 3 Encounters:  03/20/18 132/72  02/07/18 (!) 160/88  01/23/18 (!) 145/83    Pulse Readings from Last 3 Encounters:  03/20/18 (!) 55  02/07/18 (!) 58  01/23/18 (!) 50    Filed Weights   03/20/18 1328  Weight: 162 lb (73.5 kg)      Patient Care Team     Relationship Specialty Notifications Start End  Mellody Dance, DO PCP - General Family Medicine  12/09/17   Druscilla Brownie, MD Consulting Physician Dermatology  01/11/18   Webb Laws, [place] Referring Physician Optometry  01/11/18   Celedonio Savage, PA-C Consulting Physician Dermatology  01/11/18    Comment: derm      Lab Results  Component Value Date   CREATININE 0.97 01/30/2018   BUN 16 01/30/2018   NA 139 01/30/2018   K 4.5 01/30/2018   CL 99 01/30/2018   CO2 22 01/30/2018    Lab Results  Component Value Date   CHOL 219 (H) 01/30/2018   CHOL 175 11/08/2017   CHOL 168 12/23/2015    Lab Results  Component Value Date   HDL 47 01/30/2018   HDL 47 11/08/2017   HDL 45.20 12/23/2015    Lab Results  Component Value Date   LDLCALC 136 (H) 01/30/2018   [place] 91 11/08/2017    Lab Results  Component Value Date   TRIG 182 (H) 01/30/2018   TRIG 183 (A) 11/08/2017   TRIG 220.0 (H) 12/23/2015    Lab Results  Component Value Date   CHOLHDL 4.7 (H) 01/30/2018   CHOLHDL 4 12/23/2015    Lab Results  Component Value Date   LDLDIRECT 94.0 12/23/2015   ===================================================================   Patient Active Problem List   Diagnosis Date Noted  . Hypertension associated with diabetes ([place]) 01/11/2018    Priority: High  . Mixed diabetic hyperlipidemia associated with type 2 diabetes mellitus ([place]) 01/11/2018    Priority: High  . DM2 (diabetes mellitus, type 2) ([place]) 06/23/2015    Priority: High  . Gout 06/23/2015    Priority: Medium  . GERD (gastroesophageal reflux disease) 06/23/2015    Priority: Medium  . Hypothyroidism 06/21/2013    Priority: Medium  . Family history of early CAD- dad in early 60's 02/07/2018    Priority: Low  . Family history of cerebrovascular accident (CVA) in mother- in her 90's 02/07/2018    Priority: Low  . Family history of diabetes mellitus (DM)- PGM- in 40's 02/07/2018    Priority: Low  . Family  history of Alzheimer's disease- Mom in her 34 02/07/2018    Priority: Low  . Environmental and seasonal allergies 01/11/2018    Priority: Low  . Vitamin D deficiency 01/11/2018    Priority: Low  . Noncompliance with diet and medication regimen 03/20/2018  . OAB (overactive bladder) 06/23/2015  . HTN (hypertension) 06/21/2013  . Hyperlipidemia 06/21/2013     Past Medical History:  Diagnosis Date  . History of chicken pox   . History of frequent urinary tract infections   . Hyperlipidemia   . Hypertension   . Overactive bladder   . Thyroid disease      No past surgical history on file.   Family History  Problem Relation Age of Onset  . Heart disease Father   . Diabetes Paternal Grandmother   .  Cancer Paternal Grandmother   . Breast cancer Neg Hx      Social History   Substance and Sexual Activity  Drug Use No  ,  Social History   Substance and Sexual Activity  Alcohol Use No  . Alcohol/week: 0.0 oz  ,  Social History   Tobacco Use  Smoking Status Never Smoker  Smokeless Tobacco Never Used  ,    Current Outpatient Medications on File Prior to Visit  Medication Sig Dispense Refill  . allopurinol (ZYLOPRIM) 100 MG tablet Take 1 tablet by mouth daily.    Marland Kitchen amLODipine (NORVASC) 5 MG tablet Take 1 tablet by mouth daily.    Marland Kitchen aspirin EC 81 MG tablet Take 81 mg by mouth daily.    Marland Kitchen atenolol (TENORMIN) 100 MG tablet Take 100 mg by mouth daily.     Marland Kitchen atorvastatin (LIPITOR) 20 MG tablet Take 20 mg by mouth at bedtime.    . Calcium Carbonate (CALCIUM 600 PO) Take by mouth.    . Cholecalciferol (VITAMIN D3) 1000 units CAPS Take 1 capsule by mouth.    . levothyroxine (SYNTHROID, LEVOTHROID) 50 MCG tablet Take 1 tablet by mouth daily.    Marland Kitchen losartan-hydrochlorothiazide (HYZAAR) 50-12.5 MG tablet Take 1 tablet by mouth daily. 90 tablet 3  . metFORMIN (GLUCOPHAGE-XR) 500 MG 24 hr tablet Take 1 tablet by mouth daily.    . ranitidine (ZANTAC) 150 MG capsule Take 150 mg by  mouth 2 (two) times daily.    . fluticasone (FLONASE) 50 MCG/ACT nasal spray Place 1 spray into both nostrils 2 (two) times daily for 14 days. 1 g 0   No current facility-administered medications on file prior to visit.      No Known Allergies   Review of Systems:   General:  Denies fever, chills Optho/Auditory:   Denies visual changes, blurred vision Respiratory:   Denies SOB, cough, wheeze, DIB  Cardiovascular:   Denies chest pain, palpitations, painful respirations Gastrointestinal:   Denies nausea, vomiting, diarrhea.  Endocrine:     Denies new hot or cold intolerance Musculoskeletal:  Denies joint swelling, gait issues, or new unexplained myalgias/ arthralgias Skin:  Denies rash, suspicious lesions  Neurological:    Denies dizziness, unexplained weakness, numbness  Psychiatric/Behavioral:   Denies mood changes  Objective:    Blood pressure 132/72, pulse (!) 55, resp. rate 16, weight 162 lb (73.5 kg), SpO2 98 %.  Body mass index is 26.96 kg/m.  General: Well Developed, well nourished, and in no acute distress.  HEENT: Normocephalic, atraumatic, pupils equal round reactive to light, neck supple, No carotid bruits, no JVD Skin: Warm and dry, cap RF less 2 sec Cardiac: Regular rate and rhythm, S1, S2 WNL's, no murmurs rubs or gallops Respiratory: ECTA B/L, Not using accessory muscles, speaking in full sentences. NeuroM-Sk: Ambulates w/o assistance, moves ext * 4 w/o difficulty, sensation grossly intact.  Ext: scant edema b/l lower ext Psych: No HI/SI, judgement and insight good, Euthymic mood. Full Affect. Abdomen: Periumbilical hernia, approximately 7cm-13cm.

## 2018-03-20 NOTE — Patient Instructions (Addendum)
Please make sure you are taking all the medicines on your list as written on the list.  If you are not taking them even though it says to, please call us and update this.  It is extremely important for your health and well-being we know exactly what you are taking and not taking.  Around mid May please come in for follow-up to check your cholesterol, A1c, CMP.   Then 2 days later come have an office visit with me to see how your diabetes is and discuss the lab results.  Please realize, EXERCISE IS MEDICINE!  -  American Heart Association [facility]) guidelines for exercise : If you are in good health, without any medical conditions, you should engage in 150 minutes of moderate intensity aerobic activity per week.  This means you should be huffing and puffing throughout your workout.   Engaging in regular exercise will improve brain function and memory, as well as improve mood, boost immune system and help with weight management.  As well as the other, more well-known effects of exercise such as decreasing blood sugar levels, decreasing blood pressure,  and decreasing bad cholesterol levels/ increasing good cholesterol levels.     -  The AHA strongly endorses consumption of a diet that contains a variety of foods from all the food categories with an emphasis on fruits and vegetables; fat-free and low-fat dairy products; cereal and grain products; legumes and nuts; and fish, poultry, and/or extra lean meats.    Excessive food intake, especially of foods high in saturated and trans fats, sugar, and salt, should be avoided.    Adequate water intake of roughly 1/2 of your weight in pounds, should equal the ounces of water per day you should drink.  So for instance, if you're 200 pounds, that would be 100 ounces of water per day.         Mediterranean Diet  Why follow it? Research shows. . Those who follow the Mediterranean diet have a reduced risk of heart disease  . The diet is associated with a reduced  incidence of Parkinson's and Alzheimer's diseases . People following the diet may have longer life expectancies and lower rates of chronic diseases  . The Dietary Guidelines for Americans recommends the Mediterranean diet as an eating plan to promote health and prevent disease  What Is the Mediterranean Diet?  . Healthy eating plan based on typical foods and recipes of Mediterranean-style cooking . The diet is primarily a plant based diet; these foods should make up a majority of meals   Starches - Plant based foods should make up a majority of meals - They are an important sources of vitamins, minerals, energy, antioxidants, and fiber - Choose whole grains, foods high in fiber and minimally processed items  - Typical grain sources include wheat, oats, barley, corn, brown rice, bulgar, farro, millet, polenta, couscous  - Various types of beans include chickpeas, lentils, fava beans, black beans, white beans   Fruits  Veggies - Large quantities of antioxidant rich fruits & veggies; 6 or more servings  - Vegetables can be eaten raw or lightly drizzled with oil and cooked  - Vegetables common to the traditional Mediterranean Diet include: artichokes, arugula, beets, broccoli, brussel sprouts, cabbage, carrots, celery, collard greens, cucumbers, eggplant, kale, leeks, lemons, lettuce, mushrooms, okra, onions, peas, peppers, potatoes, pumpkin, radishes, rutabaga, shallots, spinach, sweet potatoes, turnips, zucchini - Fruits common to the Mediterranean Diet include: apples, apricots, avocados, cherries, clementines, dates, figs, grapefruits, grapes, melons,  nectarines, oranges, peaches, pears, pomegranates, strawberries, tangerines  Fats - Replace butter and margarine with healthy oils, such as olive oil, canola oil, and tahini  - Limit nuts to no more than a handful a day  - Nuts include walnuts, almonds, pecans, pistachios, pine nuts  - Limit or avoid candied, honey roasted or heavily salted nuts -  Olives are central to the Mediterranean diet - can be eaten whole or used in a variety of dishes   Meats Protein - Limiting red meat: no more than a few times a month - When eating red meat: choose lean cuts and keep the portion to the size of deck of cards - Eggs: approx. 0 to 4 times a week  - Fish and lean poultry: at least 2 a week  - Healthy protein sources include, chicken, [place], lean beef, lamb - Increase intake of seafood such as tuna, salmon, trout, mackerel, shrimp, scallops - Avoid or limit high fat processed meats such as sausage and bacon  Dairy - Include moderate amounts of low fat dairy products  - Focus on healthy dairy such as fat free yogurt, skim milk, low or reduced fat cheese - Limit dairy products higher in fat such as whole or 2% milk, cheese, ice cream  Alcohol - Moderate amounts of red wine is ok  - No more than 5 oz daily for women (all ages) and men older than age 60  - No more than 10 oz of wine daily for men younger than 2  Other - Limit sweets and other desserts  - Use herbs and spices instead of salt to flavor foods  - Herbs and spices common to the traditional Mediterranean Diet include: basil, bay leaves, chives, cloves, cumin, fennel, garlic, lavender, marjoram, mint, oregano, parsley, pepper, rosemary, sage, savory, sumac, tarragon, thyme   It's not just a diet, it's a lifestyle:  . The Mediterranean diet includes lifestyle factors typical of those in the region  . Foods, drinks and meals are best eaten with others and savored . Daily physical activity is important for overall good health . This could be strenuous exercise like running and aerobics . This could also be more leisurely activities such as walking, housework, yard-work, or taking the stairs . Moderation is the key; a balanced and healthy diet accommodates most foods and drinks . Consider portion sizes and frequency of consumption of certain foods   Meal Ideas & Options:  . Breakfast:   o Whole wheat toast or whole wheat English muffins with peanut butter & hard boiled egg o Steel cut oats topped with apples & cinnamon and skim milk  o Fresh fruit: banana, strawberries, melon, berries, peaches  o Smoothies: strawberries, bananas, greek yogurt, peanut butter o Low fat greek yogurt with blueberries and granola  o Egg white omelet with spinach and mushrooms o Breakfast couscous: whole wheat couscous, apricots, skim milk, cranberries  . Sandwiches:  o Hummus and grilled vegetables (peppers, zucchini, squash) on whole wheat bread   o Grilled chicken on whole wheat pita with lettuce, tomatoes, cucumbers or tzatziki  o Tuna salad on whole wheat bread: tuna salad made with greek yogurt, olives, red peppers, capers, green onions o Garlic rosemary lamb pita: lamb sauted with garlic, rosemary, salt & pepper; add lettuce, cucumber, greek yogurt to pita - flavor with lemon juice and black pepper  . Seafood:  o Mediterranean grilled salmon, seasoned with garlic, basil, parsley, lemon juice and black pepper o Shrimp, lemon, and spinach whole-grain  pasta salad made with low fat greek yogurt  o Seared scallops with lemon orzo  o Seared tuna steaks seasoned salt, pepper, coriander topped with tomato mixture of olives, tomatoes, olive oil, minced garlic, parsley, green onions and cappers  . Meats:  o Herbed greek chicken salad with kalamata olives, cucumber, feta  o Red bell peppers stuffed with spinach, bulgur, lean ground beef (or lentils) & topped with feta   o Kebabs: skewers of chicken, tomatoes, onions, zucchini, squash  o [place] burgers: made with red onions, mint, dill, lemon juice, feta cheese topped with roasted red peppers . Vegetarian o Cucumber salad: cucumbers, artichoke hearts, celery, red onion, feta cheese, tossed in olive oil & lemon juice  o Hummus and whole grain pita points with a greek salad (lettuce, tomato, feta, olives, cucumbers, red onion) o Lentil soup with  celery, carrots made with vegetable broth, garlic, salt and pepper  o Tabouli salad: parsley, bulgur, mint, scallions, cucumbers, tomato, radishes, lemon juice, olive oil, salt and pepper.

## 2018-04-10 ENCOUNTER — Ambulatory Visit (INDEPENDENT_AMBULATORY_CARE_PROVIDER_SITE_OTHER): Payer: Medicare Other | Admitting: Adult Health

## 2018-04-10 ENCOUNTER — Encounter: Payer: Self-pay | Admitting: Adult Health

## 2018-04-10 VITALS — BP 145/69 | HR 56 | Temp 98.0°F | Ht 65.0 in | Wt 163.5 lb

## 2018-04-10 DIAGNOSIS — J0100 Acute maxillary sinusitis, unspecified: Secondary | ICD-10-CM

## 2018-04-10 DIAGNOSIS — H6690 Otitis media, unspecified, unspecified ear: Secondary | ICD-10-CM

## 2018-04-10 DIAGNOSIS — R05 Cough: Secondary | ICD-10-CM | POA: Diagnosis not present

## 2018-04-10 DIAGNOSIS — R052 Subacute cough: Secondary | ICD-10-CM | POA: Insufficient documentation

## 2018-04-10 DIAGNOSIS — R059 Cough, unspecified: Secondary | ICD-10-CM | POA: Insufficient documentation

## 2018-04-10 MED ORDER — HYDROCOD POLST-CPM POLST ER 10-8 MG/5ML PO SUER: 5.0000 mL | Freq: Two times a day (BID) | ORAL | 0 refills | Status: DC | PRN

## 2018-04-10 MED ORDER — AMOXICILLIN-POT CLAVULANATE 875-125 MG PO TABS: 1.0000 | ORAL_TABLET | Freq: Two times a day (BID) | ORAL | 0 refills | Status: DC

## 2018-04-10 MED ORDER — FLUCONAZOLE 150 MG PO TABS: 150.0000 mg | ORAL_TABLET | Freq: Once | ORAL | 0 refills | Status: AC

## 2018-04-10 NOTE — Assessment & Plan Note (Signed)
Please take Augmentin twice daily for 10 days. Please take Tussionex as needed- it may not be covered under your insurance plan. [place] Controlled Substance Database reviewed- no aberrancies noted. Increase fluids/rest/vit c- 2,000mg /day

## 2018-04-10 NOTE — Progress Notes (Signed)
Subjective:    Patient ID: Jill Daugherty, female    DOB: 1946/11/22, 72 y.o.   MRN: [id number]  HPI:  Jill Daugherty presents with sore throat (4/10), diffuse HA (2/10), copious clear nasal drainage, constant productive cough (thick/white mucus), L ear pain (2/10), facial pressure, and increased fatigue >1.5 weeks, with gradual worsening of sx's. She has been using OTC Cough medications and Flonase with only minor sx relief. She denies CP/dyspnea at rest/palpitations/N/V. She estimates to drink 2 16 oz bottles of water/day.  Patient Care Team    Relationship Specialty Notifications Start End  Mellody Dance, DO PCP - General Family Medicine  12/09/17   Druscilla Brownie, MD Consulting Physician Dermatology  01/11/18   Webb Laws, [place] Referring Physician Optometry  01/11/18   Celedonio Savage, PA-C Consulting Physician Dermatology  01/11/18    Comment: derm     Patient Active Problem List   Diagnosis Date Noted  . Acute maxillary sinusitis 04/10/2018  . Acute otitis media 04/10/2018  . Cough in adult 04/10/2018  . Noncompliance with diet and medication regimen 03/20/2018  . Family history of early CAD- dad in early 60's 02/07/2018  . Family history of cerebrovascular accident (CVA) in mother- in her 57's 02/07/2018  . Family history of diabetes mellitus (DM)- PGM- in 40's 02/07/2018  . Family history of Alzheimer's disease- Mom in her 85 02/07/2018  . Hypertension associated with diabetes ([place]) 01/11/2018  . Mixed diabetic hyperlipidemia associated with type 2 diabetes mellitus ([place]) 01/11/2018  . Environmental and seasonal allergies 01/11/2018  . Vitamin D deficiency 01/11/2018  . Gout 06/23/2015  . DM2 (diabetes mellitus, type 2) ([place]) 06/23/2015  . GERD (gastroesophageal reflux disease) 06/23/2015  . OAB (overactive bladder) 06/23/2015  . HTN (hypertension) 06/21/2013  . Hyperlipidemia 06/21/2013  . Hypothyroidism 06/21/2013     Past Medical History:  Diagnosis Date   . History of chicken pox   . History of frequent urinary tract infections   . Hyperlipidemia   . Hypertension   . Overactive bladder   . Thyroid disease      History reviewed. No pertinent surgical history.   Family History  Problem Relation Age of Onset  . Heart disease Father   . Diabetes Paternal Grandmother   . Cancer Paternal Grandmother   . Breast cancer Neg Hx      Social History   Substance and Sexual Activity  Drug Use No     Social History   Substance and Sexual Activity  Alcohol Use No  . Alcohol/week: 0.0 oz     Social History   Tobacco Use  Smoking Status Never Smoker  Smokeless Tobacco Never Used     Outpatient Encounter Medications as of 04/10/2018  Medication Sig Note  . allopurinol (ZYLOPRIM) 100 MG tablet Take 1 tablet by mouth daily. 06/23/2015: Received from: External Pharmacy Received Sig:   . amLODipine (NORVASC) 5 MG tablet Take 1 tablet by mouth daily. 06/23/2015: Received from: External Pharmacy Received Sig:   . aspirin EC 81 MG tablet Take 81 mg by mouth daily.   Marland Kitchen atenolol (TENORMIN) 100 MG tablet Take 100 mg by mouth daily.    Marland Kitchen atorvastatin (LIPITOR) 20 MG tablet Take 20 mg by mouth at bedtime.   . Calcium Carbonate (CALCIUM 600 PO) Take by mouth.   . Cholecalciferol (VITAMIN D3) 1000 units CAPS Take 1 capsule by mouth.   . levothyroxine (SYNTHROID, LEVOTHROID) 50 MCG tablet Take 1 tablet by mouth daily.   Marland Kitchen losartan-hydrochlorothiazide (  HYZAAR) 50-12.5 MG tablet Take 1 tablet by mouth daily.   . metFORMIN (GLUCOPHAGE-XR) 500 MG 24 hr tablet Take 1 tablet by mouth daily.   . ranitidine (ZANTAC) 150 MG capsule Take 150 mg by mouth 2 (two) times daily.   Marland Kitchen amoxicillin-clavulanate (AUGMENTIN) 875-125 MG tablet Take 1 tablet by mouth 2 (two) times daily.   . chlorpheniramine-HYDROcodone (TUSSIONEX) 10-8 MG/5ML SUER Take 5 mLs by mouth every 12 (twelve) hours as needed.   . fluconazole (DIFLUCAN) 150 MG tablet Take 1 tablet (150 mg  total) by mouth once for 1 dose.   . fluticasone (FLONASE) 50 MCG/ACT nasal spray Place 1 spray into both nostrils 2 (two) times daily for 14 days.    No facility-administered encounter medications on file as of 04/10/2018.     Allergies: Patient has no known allergies.  Body mass index is 27.21 kg/m.  Blood pressure (!) 145/69, pulse (!) 56, temperature 98 F (36.7 C), temperature source Oral, height 5\' 5"  (1.651 m), weight 163 lb 8 oz (74.2 kg).    Review of Systems  Constitutional: Positive for fatigue. Negative for activity change, appetite change, chills, diaphoresis, fever and unexpected weight change.  HENT: Positive for congestion, ear pain, postnasal drip, rhinorrhea, sinus pressure, sneezing, sore throat and voice change. Negative for sinus pain.   Eyes: Negative for visual disturbance.  Respiratory: Positive for cough. Negative for chest tightness, shortness of breath, wheezing and stridor.   Cardiovascular: Negative for chest pain, palpitations and leg swelling.  Gastrointestinal: Negative for abdominal distention, abdominal pain, blood in stool, constipation, diarrhea, nausea and vomiting.       Reports loose stool this morning Denies hematochezia  Genitourinary: Negative for difficulty urinating and flank pain.  Neurological: Positive for headaches. Negative for dizziness.  Hematological: Does not bruise/bleed easily.  Psychiatric/Behavioral: Positive for sleep disturbance.       Objective:   Physical Exam  Constitutional: She is oriented to person, place, and time. She appears well-developed and well-nourished. No distress.  HENT:  Head: Normocephalic and atraumatic.  Right Ear: External ear normal. Tympanic membrane is bulging. Tympanic membrane is not erythematous. No decreased hearing is noted.  Left Ear: External ear normal. Tympanic membrane is erythematous and bulging. No decreased hearing is noted.  Nose: Mucosal edema and rhinorrhea present. Right sinus  exhibits no maxillary sinus tenderness and no frontal sinus tenderness. Left sinus exhibits maxillary sinus tenderness. Left sinus exhibits no frontal sinus tenderness.  Mouth/Throat: Uvula is midline and mucous membranes are normal. Posterior oropharyngeal edema and posterior oropharyngeal erythema present. No oropharyngeal exudate or tonsillar abscesses.  Eyes: Pupils are equal, round, and reactive to light. Conjunctivae are normal.  Neck: Normal range of motion. Neck supple.  Cardiovascular: Normal rate, regular rhythm, normal heart sounds and intact distal pulses.  No murmur heard. Pulmonary/Chest: Effort normal and breath sounds normal. No respiratory distress. She has no wheezes. She has no rales. She exhibits no tenderness.  Lymphadenopathy:    She has cervical adenopathy.  Neurological: She is alert and oriented to person, place, and time.  Skin: Skin is warm and dry. No rash noted. She is not diaphoretic. No erythema. No pallor.  Psychiatric: She has a normal mood and affect. Her behavior is normal. Judgment and thought content normal.  Nursing note and vitals reviewed.     Assessment & Plan:   1. Acute maxillary sinusitis, recurrence not specified   2. Acute otitis media, unspecified otitis media type   3. Cough in adult  Acute maxillary sinusitis Please take Augmentin twice daily for 10 days. Please take Tussionex as needed- it may not be covered under your insurance plan. Increase fluids/rest/vit c- 2,000mg /day Alternate OTC Acetaminophen and Ibuprofen as needed for pain. If yeast infection symptoms develop while on antibiotic, then please take Diflucan 150mg . If symptoms persist after antibiotic completed, then please call clinic.  Cough in adult Please take Augmentin twice daily for 10 days. Please take Tussionex as needed- it may not be covered under your insurance plan. [place] Controlled Substance Database reviewed- no aberrancies noted. Increase  fluids/rest/vit c- 2,000mg /day     FOLLOW-UP:  Return if symptoms worsen or fail to improve.

## 2018-04-10 NOTE — Assessment & Plan Note (Signed)
Please take Augmentin twice daily for 10 days. Please take Tussionex as needed- it may not be covered under your insurance plan. Increase fluids/rest/vit c- 2,000mg /day Alternate OTC Acetaminophen and Ibuprofen as needed for pain. If yeast infection symptoms develop while on antibiotic, then please take Diflucan 150mg . If symptoms persist after antibiotic completed, then please call clinic.

## 2018-04-10 NOTE — Patient Instructions (Addendum)
Sinusitis, Adult Sinusitis is soreness and inflammation of your sinuses. Sinuses are hollow spaces in the bones around your face. Your sinuses are located:  Around your eyes.  In the middle of your forehead.  Behind your nose.  In your cheekbones.  Your sinuses and nasal passages are lined with a stringy fluid (mucus). Mucus normally drains out of your sinuses. When your nasal tissues become inflamed or swollen, the mucus can become trapped or blocked so air cannot flow through your sinuses. This allows bacteria, viruses, and funguses to grow, which leads to infection. Sinusitis can develop quickly and last for 7?10 days (acute) or for more than 12 weeks (chronic). Sinusitis often develops after a cold. What are the causes? This condition is caused by anything that creates swelling in the sinuses or stops mucus from draining, including:  Allergies.  Asthma.  Bacterial or viral infection.  Abnormally shaped bones between the nasal passages.  Nasal growths that contain mucus (nasal polyps).  Narrow sinus openings.  Pollutants, such as chemicals or irritants in the air.  A foreign object stuck in the nose.  A fungal infection. This is rare.  What increases the risk? The following factors may make you more likely to develop this condition:  Having allergies or asthma.  Having had a recent cold or respiratory tract infection.  Having structural deformities or blockages in your nose or sinuses.  Having a weak immune system.  Doing a lot of swimming or diving.  Overusing nasal sprays.  Smoking.  What are the signs or symptoms? The main symptoms of this condition are pain and a feeling of pressure around the affected sinuses. Other symptoms include:  Upper toothache.  Earache.  Headache.  Bad breath.  Decreased sense of smell and taste.  A cough that may get worse at night.  Fatigue.  Fever.  Thick drainage from your nose. The drainage is often green and  it may contain pus (purulent).  Stuffy nose or congestion.  Postnasal drip. This is when extra mucus collects in the throat or back of the nose.  Swelling and warmth over the affected sinuses.  Sore throat.  Sensitivity to light.  How is this diagnosed? This condition is diagnosed based on symptoms, a medical history, and a physical exam. To find out if your condition is acute or chronic, your health care provider may:  Look in your nose for signs of nasal polyps.  Tap over the affected sinus to check for signs of infection.  View the inside of your sinuses using an imaging device that has a light attached (endoscope).  If your health care provider suspects that you have chronic sinusitis, you may also:  Be tested for allergies.  Have a sample of mucus taken from your nose (nasal culture) and checked for bacteria.  Have a mucus sample examined to see if your sinusitis is related to an allergy.  If your sinusitis does not respond to treatment and it lasts longer than 8 weeks, you may have an MRI or CT scan to check your sinuses. These scans also help to determine how severe your infection is. In rare cases, a bone biopsy may be done to rule out more serious types of fungal sinus disease. How is this treated? Treatment for sinusitis depends on the cause and whether your condition is chronic or acute. If a virus is causing your sinusitis, your symptoms will go away on their own within 10 days. You may be given medicines to relieve your symptoms,   including:  Topical nasal decongestants. They shrink swollen nasal passages and let mucus drain from your sinuses.  Antihistamines. These drugs block inflammation that is triggered by allergies. This can help to ease swelling in your nose and sinuses.  Topical nasal corticosteroids. These are nasal sprays that ease inflammation and swelling in your nose and sinuses.  Nasal saline washes. These rinses can help to get rid of thick mucus in  your nose.  If your condition is caused by bacteria, you will be given an antibiotic medicine. If your condition is caused by a fungus, you will be given an antifungal medicine. Surgery may be needed to correct underlying conditions, such as narrow nasal passages. Surgery may also be needed to remove polyps. Follow these instructions at home: Medicines  Take, use, or apply over-the-counter and prescription medicines only as told by your health care provider. These may include nasal sprays.  If you were prescribed an antibiotic medicine, take it as told by your health care provider. Do not stop taking the antibiotic even if you start to feel better. Hydrate and Humidify  Drink enough water to keep your urine clear or pale yellow. Staying hydrated will help to thin your mucus.  Use a cool mist humidifier to keep the humidity level in your home above 50%.  Inhale steam for 10-15 minutes, 3-4 times a day or as told by your health care provider. You can do this in the bathroom while a hot shower is running.  Limit your exposure to cool or dry air. Rest  Rest as much as possible.  Sleep with your head raised (elevated).  Make sure to get enough sleep each night. General instructions  Apply a warm, moist washcloth to your face 3-4 times a day or as told by your health care provider. This will help with discomfort.  Wash your hands often with soap and water to reduce your exposure to viruses and other germs. If soap and water are not available, use hand sanitizer.  Do not smoke. Avoid being around people who are smoking (secondhand smoke).  Keep all follow-up visits as told by your health care provider. This is important. Contact a health care provider if:  You have a fever.  Your symptoms get worse.  Your symptoms do not improve within 10 days. Get help right away if:  You have a severe headache.  You have persistent vomiting.  You have pain or swelling around your face or  eyes.  You have vision problems.  You develop confusion.  Your neck is stiff.  You have trouble breathing. This information is not intended to replace advice given to you by your health care provider. Make sure you discuss any questions you have with your health care provider. Document Released: 12/13/2005 Document Revised: 08/08/2016 Document Reviewed: 10/08/2015 Elsevier Interactive Patient Education  2018 [employer].   Acute Bronchitis, Adult Acute bronchitis is sudden (acute) swelling of the air tubes (bronchi) in the lungs. Acute bronchitis causes these tubes to fill with mucus, which can make it hard to breathe. It can also cause coughing or wheezing. In adults, acute bronchitis usually goes away within 2 weeks. A cough caused by bronchitis may last up to 3 weeks. Smoking, allergies, and asthma can make the condition worse. Repeated episodes of bronchitis may cause further lung problems, such as chronic obstructive pulmonary disease (COPD). What are the causes? This condition can be caused by germs and by substances that irritate the lungs, including:  Cold and flu viruses.  This condition is most often caused by the same virus that causes a cold.  Bacteria.  Exposure to tobacco smoke, dust, fumes, and air pollution.  What increases the risk? This condition is more likely to develop in people who:  Have close contact with someone with acute bronchitis.  Are exposed to lung irritants, such as tobacco smoke, dust, fumes, and vapors.  Have a weak immune system.  Have a respiratory condition such as asthma.  What are the signs or symptoms? Symptoms of this condition include:  A cough.  Coughing up clear, yellow, or green mucus.  Wheezing.  Chest congestion.  Shortness of breath.  A fever.  Body aches.  Chills.  A sore throat.  How is this diagnosed? This condition is usually diagnosed with a physical exam. During the exam, your health care provider may  order tests, such as chest X-rays, to rule out other conditions. He or she may also:  Test a sample of your mucus for bacterial infection.  Check the level of oxygen in your blood. This is done to check for pneumonia.  Do a chest X-ray or lung function testing to rule out pneumonia and other conditions.  Perform blood tests.  Your health care provider will also ask about your symptoms and medical history. How is this treated? Most cases of acute bronchitis clear up over time without treatment. Your health care provider may recommend:  Drinking more fluids. Drinking more makes your mucus thinner, which may make it easier to breathe.  Taking a medicine for a fever or cough.  Taking an antibiotic medicine.  Using an inhaler to help improve shortness of breath and to control a cough.  Using a cool mist vaporizer or humidifier to make it easier to breathe.  Follow these instructions at home: Medicines  Take over-the-counter and prescription medicines only as told by your health care provider.  If you were prescribed an antibiotic, take it as told by your health care provider. Do not stop taking the antibiotic even if you start to feel better. General instructions  Get plenty of rest.  Drink enough fluids to keep your urine clear or pale yellow.  Avoid smoking and secondhand smoke. Exposure to cigarette smoke or irritating chemicals will make bronchitis worse. If you smoke and you need help quitting, ask your health care provider. Quitting smoking will help your lungs heal faster.  Use an inhaler, cool mist vaporizer, or humidifier as told by your health care provider.  Keep all follow-up visits as told by your health care provider. This is important. How is this prevented? To lower your risk of getting this condition again:  Wash your hands often with soap and water. If soap and water are not available, use hand sanitizer.  Avoid contact with people who have cold  symptoms.  Try not to touch your hands to your mouth, nose, or eyes.  Make sure to get the flu shot every year.  Contact a health care provider if:  Your symptoms do not improve in 2 weeks of treatment. Get help right away if:  You cough up blood.  You have chest pain.  You have severe shortness of breath.  You become dehydrated.  You faint or keep feeling like you are going to faint.  You keep vomiting.  You have a severe headache.  Your fever or chills gets worse. This information is not intended to replace advice given to you by your health care provider. Make sure you discuss any questions  you have with your health care provider. Document Released: 01/20/2005 Document Revised: 07/07/2016 Document Reviewed: 06/02/2016 Elsevier Interactive Patient Education  2018 [employer].  Please take Augmentin twice daily for 10 days. Please take Tussionex as needed- it may not be covered under your insurance plan. Increase fluids/rest/vit c- 2,000mg /day Alternate OTC Acetaminophen and Ibuprofen as needed for pain. If yeast infection symptoms develop while on antibiotic, then please take Diflucan 150mg . If symptoms persist after antibiotic completed, then please call clinic. FEEL BETTER!

## 2018-05-09 ENCOUNTER — Other Ambulatory Visit: Payer: Medicare Other

## 2018-05-09 DIAGNOSIS — I10 Essential (primary) hypertension: Secondary | ICD-10-CM | POA: Diagnosis not present

## 2018-05-09 DIAGNOSIS — Z9114 Patient's other noncompliance with medication regimen: Secondary | ICD-10-CM | POA: Diagnosis not present

## 2018-05-09 DIAGNOSIS — Z9111 Patient's noncompliance with dietary regimen: Secondary | ICD-10-CM | POA: Diagnosis not present

## 2018-05-09 DIAGNOSIS — I152 Hypertension secondary to endocrine disorders: Secondary | ICD-10-CM

## 2018-05-09 DIAGNOSIS — E1159 Type 2 diabetes mellitus with other circulatory complications: Secondary | ICD-10-CM

## 2018-05-10 LAB — LIPID PANEL
CHOL/HDL RATIO: 2.9 ratio (ref 0.0–4.4)
CHOLESTEROL TOTAL: 128 mg/dL (ref 100–199)
HDL: 44 mg/dL (ref >39)
LDL CALC: 57 mg/dL (ref 0–99)
TRIGLYCERIDES: 137 mg/dL (ref 0–149)
VLDL CHOLESTEROL CAL: 27 mg/dL (ref 5–40)

## 2018-05-10 LAB — COMPREHENSIVE METABOLIC PANEL
A/G RATIO: 2.1 (ref 1.2–2.2)
ALBUMIN: 4.5 g/dL (ref 3.5–4.8)
ALK PHOS: 64 IU/L (ref 39–117)
ALT: 28 IU/L (ref 0–32)
AST: 21 IU/L (ref 0–40)
BILIRUBIN TOTAL: 0.5 mg/dL (ref 0.0–1.2)
BUN/Creatinine Ratio: 21 (ref 12–28)
BUN: 16 mg/dL (ref 8–27)
CHLORIDE: 101 mmol/L (ref 96–106)
CO2: 26 mmol/L (ref 20–29)
CREATININE: 0.78 mg/dL (ref 0.57–1.00)
Calcium: 10.3 mg/dL (ref 8.7–10.3)
GFR calc Af Amer: 88 mL/min/{1.73_m2} (ref >59)
GFR calc non Af Amer: 76 mL/min/{1.73_m2} (ref >59)
Globulin, Total: 2.1 g/dL (ref 1.5–4.5)
Glucose: 125 mg/dL — ABNORMAL HIGH (ref 65–99)
Potassium: 4.7 mmol/L (ref 3.5–5.2)
SODIUM: 143 mmol/L (ref 134–144)
Total Protein: 6.6 g/dL (ref 6.0–8.5)

## 2018-05-10 LAB — CBC WITH DIFFERENTIAL/PLATELET
BASOS: 1 %
Basophils Absolute: 0.1 10*3/uL (ref 0.0–0.2)
EOS (ABSOLUTE): 0.2 10*3/uL (ref 0.0–0.4)
EOS: 2 %
HEMOGLOBIN: 14.2 g/dL (ref 11.1–15.9)
Hematocrit: 42.2 % (ref 34.0–46.6)
IMMATURE GRANS (ABS): 0 10*3/uL (ref 0.0–0.1)
Immature Granulocytes: 0 %
LYMPHS: 26 %
Lymphocytes Absolute: 2 10*3/uL (ref 0.7–3.1)
MCH: 31.1 pg (ref 26.6–33.0)
MCHC: 33.6 g/dL (ref 31.5–35.7)
MCV: 92 fL (ref 79–97)
MONOCYTES: 9 %
Monocytes Absolute: 0.7 10*3/uL (ref 0.1–0.9)
NEUTROS ABS: 4.8 10*3/uL (ref 1.4–7.0)
Neutrophils: 62 %
Platelets: 245 10*3/uL (ref 150–379)
RBC: 4.57 x10E6/uL (ref 3.77–5.28)
RDW: 13.9 % (ref 12.3–15.4)
WBC: 7.7 10*3/uL (ref 3.4–10.8)

## 2018-05-10 LAB — HEMOGLOBIN A1C
Est. average glucose Bld gHb Est-mCnc: 134 mg/dL
Hgb A1c MFr Bld: 6.3 % — ABNORMAL HIGH (ref 4.8–5.6)

## 2018-05-10 LAB — MAGNESIUM: Magnesium: 1.8 mg/dL (ref 1.6–2.3)

## 2018-05-11 ENCOUNTER — Ambulatory Visit (INDEPENDENT_AMBULATORY_CARE_PROVIDER_SITE_OTHER): Payer: Medicare Other | Admitting: Family Medicine

## 2018-05-11 ENCOUNTER — Encounter: Payer: Self-pay | Admitting: Family Medicine

## 2018-05-11 VITALS — BP 140/89 | HR 59 | Ht 65.0 in | Wt 162.7 lb

## 2018-05-11 DIAGNOSIS — N183 Chronic kidney disease, stage 3 (moderate): Secondary | ICD-10-CM | POA: Diagnosis not present

## 2018-05-11 DIAGNOSIS — L97509 Non-pressure chronic ulcer of other part of unspecified foot with unspecified severity: Secondary | ICD-10-CM | POA: Diagnosis not present

## 2018-05-11 DIAGNOSIS — E11621 Type 2 diabetes mellitus with foot ulcer: Secondary | ICD-10-CM

## 2018-05-11 DIAGNOSIS — E1159 Type 2 diabetes mellitus with other circulatory complications: Secondary | ICD-10-CM

## 2018-05-11 DIAGNOSIS — H6122 Impacted cerumen, left ear: Secondary | ICD-10-CM | POA: Diagnosis not present

## 2018-05-11 DIAGNOSIS — I10 Essential (primary) hypertension: Secondary | ICD-10-CM

## 2018-05-11 DIAGNOSIS — I152 Hypertension secondary to endocrine disorders: Secondary | ICD-10-CM

## 2018-05-11 DIAGNOSIS — E1169 Type 2 diabetes mellitus with other specified complication: Secondary | ICD-10-CM | POA: Diagnosis not present

## 2018-05-11 DIAGNOSIS — E782 Mixed hyperlipidemia: Secondary | ICD-10-CM

## 2018-05-11 DIAGNOSIS — E1122 Type 2 diabetes mellitus with diabetic chronic kidney disease: Secondary | ICD-10-CM | POA: Diagnosis not present

## 2018-05-11 DIAGNOSIS — H9202 Otalgia, left ear: Secondary | ICD-10-CM | POA: Diagnosis not present

## 2018-05-11 NOTE — Patient Instructions (Addendum)
Check your blood pressure at home occasionally and it should run consistently under 140/90  Diabetes Mellitus and Standards of Medical Care  Managing diabetes (diabetes mellitus) can be complicated. Your diabetes treatment may be managed by a team of health care providers, including:  A diet and nutrition specialist (registered dietitian).  A nurse.  A certified diabetes educator (CDE).  A diabetes specialist (endocrinologist).  An eye doctor.  A primary care provider.  A dentist.  Your health care providers follow a schedule in order to help you get the best quality of care. The following schedule is a general guideline for your diabetes management plan. Your health care providers may also give you more specific instructions.  HbA1c (hemoglobin A1c) test This test provides information about blood sugar (glucose) control over the previous 2-3 months. It is used to check whether your diabetes management plan needs to be adjusted.  If you are meeting your treatment goals, this test is done at least 2 times a year.  If you are not meeting treatment goals or if your treatment goals have changed, this test is done 4 times a year.  Blood pressure test  This test is done at every routine medical visit. For most people, the goal is less than 130/80. Ask your health care provider what your goal blood pressure should be.  Dental and eye exams  Visit your dentist two times a year.  If you have type 1 diabetes, get an eye exam 3-5 years after you are diagnosed, and then once a year after your first exam. ? If you were diagnosed with type 1 diabetes as a child, get an eye exam when you are age 69 or older and have had diabetes for 3-5 years. After the first exam, you should get an eye exam once a year.  If you have type 2 diabetes, have an eye exam as soon as you are diagnosed, and then once a year after your first exam.  Foot care exam  Visual foot exams are done at every routine  medical visit. The exams check for cuts, bruises, redness, blisters, sores, or other problems with the feet.  A complete foot exam is done by your health care provider once a year. This exam includes an inspection of the structure and skin of your feet, and a check of the pulses and sensation in your feet. ? Type 1 diabetes: Get your first exam 3-5 years after diagnosis. ? Type 2 diabetes: Get your first exam as soon as you are diagnosed.  Check your feet every day for cuts, bruises, redness, blisters, or sores. If you have any of these or other problems that are not healing, contact your health care provider.  Kidney function test (urine microalbumin)  This test is done once a year. ? Type 1 diabetes: Get your first test 5 years after diagnosis. ? Type 2 diabetes: Get your first test as soon as you are diagnosed._  If you have chronic kidney disease (CKD), get a serum creatinine and estimated glomerular filtration rate (eGFR) test once a year.  Lipid profile (cholesterol, HDL, LDL, triglycerides)  This test should be done when you are diagnosed with diabetes, and every 5 years after the first test. If you are on medicines to lower your cholesterol, you may need to get this test done every year. ? The goal for LDL is less than 100 mg/dL (5.5 mmol/L). If you are at high risk, the goal is less than 70 mg/dL (3.9 mmol/L). ?  The goal for HDL is 40 mg/dL (2.2 mmol/L) for men and 50 mg/dL(2.8 mmol/L) for women. An HDL cholesterol of 60 mg/dL (3.3 mmol/L) or higher gives some protection against heart disease. ? The goal for triglycerides is less than 150 mg/dL (8.3 mmol/L).  Immunizations  The yearly flu (influenza) vaccine is recommended for everyone 6 months or older who has diabetes.  The pneumonia (pneumococcal) vaccine is recommended for everyone 2 years or older who has diabetes. If you are 37 or older, you may get the pneumonia vaccine as a series of two separate shots.  The hepatitis B  vaccine is recommended for adults shortly after they have been diagnosed with diabetes.  The Tdap (tetanus, diphtheria, and pertussis) vaccine should be given: ? According to normal childhood vaccination schedules, for children. ? Every 10 years, for adults who have diabetes.  The shingles vaccine is recommended for people who have had chicken pox and are 50 years or older.  Mental and emotional health  Screening for symptoms of eating disorders, anxiety, and depression is recommended at the time of diagnosis and afterward as needed. If your screening shows that you have symptoms (you have a positive screening result), you may need further evaluation and be referred to a mental health care provider.  Diabetes self-management education  Education about how to manage your diabetes is recommended at diagnosis and ongoing as needed.  Treatment plan  Your treatment plan will be reviewed at every medical visit.  Summary  Managing diabetes (diabetes mellitus) can be complicated. Your diabetes treatment may be managed by a team of health care providers.  Your health care providers follow a schedule in order to help you get the best quality of care.  Standards of care including having regular physical exams, blood tests, blood pressure monitoring, immunizations, screening tests, and education about how to manage your diabetes.  Your health care providers may also give you more specific instructions based on your individual health.      Type 2 Diabetes Mellitus, Self Care, Adult Caring for yourself after you have been diagnosed with type 2 diabetes (type 2 diabetes mellitus) means keeping your blood sugar (glucose) under control with a balance of:  Nutrition.  Exercise.  Lifestyle changes.  Medicines or insulin, if necessary.  Support from your team of health care providers and others.  The following information explains what you need to know to manage your diabetes at  home. What do I need to do to manage my blood glucose?  Check your blood glucose every day, as often as told by your health care provider.  Contact your health care provider if your blood glucose is above your target for 2 tests in a row.  Have your A1c (hemoglobin A1c) level checked at least two times a year, or as often as told by your health care provider. Your health care provider will set individualized treatment goals for you. Generally, the goal of treatment is to maintain the following blood glucose levels:  Before meals (preprandial): 80-130 mg/dL (4.4-7.2 mmol/L).  After meals (postprandial): below 180 mg/dL (10 mmol/L).  A1c level: less than 7%.  What do I need to know about hyperglycemia and hypoglycemia? What is hyperglycemia? Hyperglycemia, also called high blood glucose, occurs when blood glucose is too high.Make sure you know the early signs of hyperglycemia, such as:  Increased thirst.  Hunger.  Feeling very tired.  Needing to urinate more often than usual.  Blurry vision.  What is hypoglycemia? Hypoglycemia, also called  low blood glucose, occurswith a blood glucose level at or below 70 mg/dL (3.9 mmol/L). The risk for hypoglycemia increases during or after exercise, during sleep, during illness, and when skipping meals or not eating for a long time (fasting). It is important to know the symptoms of hypoglycemia and treat it right away. Always have a 15-gram rapid-acting carbohydrate snack with you to treat low blood glucose. Family members and close friends should also know the symptoms and should understand how to treat hypoglycemia, in case you are not able to treat yourself. What are the symptoms of hypoglycemia? Hypoglycemia symptoms can include:  Hunger.  Anxiety.  Sweating and feeling clammy.  Confusion.  Dizziness or feeling light-headed.  Sleepiness.  Nausea.  Increased heart rate.  Headache.  Blurry  vision.  Seizure.  Nightmares.  Tingling or numbness around the mouth, lips, or tongue.  A change in speech.  Decreased ability to concentrate.  A change in coordination.  Restless sleep.  Tremors or shakes.  Fainting.  Irritability.  How do I treat hypoglycemia?  If you are alert and able to swallow safely, follow the 15:15 rule:  Take 15 grams of a rapid-acting carbohydrate. Rapid-acting options include: ? 1 tube of glucose gel. ? 3 glucose pills. ? 6-8 pieces of hard candy. ? 4 oz (120 mL) of fruit juice. ? 4 oz (120 mL) of regular (not diet) soda.  Check your blood glucose 15 minutes after you take the carbohydrate.  If the repeat blood glucose level is still at or below 70 mg/dL (3.9 mmol/L), take 15 grams of a carbohydrate again.  If your blood glucose level does not increase above 70 mg/dL (3.9 mmol/L) after 3 tries, seek emergency medical care.  After your blood glucose level returns to normal, eat a meal or a snack within 1 hour.  How do I treat severe hypoglycemia? Severe hypoglycemia is when your blood glucose level is at or below 54 mg/dL (3 mmol/L). Severe hypoglycemia is an emergency. Do not wait to see if the symptoms will go away. Get medical help right away. Call your local emergency services (911 in the [place] Do not drive yourself to the hospital. If you have severe hypoglycemia and you cannot eat or drink, you may need an injection of glucagon. A family member or close friend should learn how to check your blood glucose and how to give you a glucagon injection. Ask your health care provider if you need to have an emergency glucagon injection kit available. Severe hypoglycemia may need to be treated in a hospital. The treatment may include getting glucose through an IV tube. You may also need treatment for the cause of your hypoglycemia. Can having diabetes put me at risk for other conditions? Having diabetes can put you at risk for other long-term  (chronic) conditions, such as heart disease and kidney disease. Your health care provider may prescribe medicines to help prevent complications from diabetes. These medicines may include:  Aspirin.  Medicine to lower cholesterol.  Medicine to control blood pressure.  What else can I do to manage my diabetes? Take your diabetes medicines as told  If your health care provider prescribed insulin or diabetes medicines, take them every day.  Do not run out of insulin or other diabetes medicines that you take. Plan ahead so you always have these available.  If you use insulin, adjust your dosage based on how physically active you are and what foods you eat. Your health care provider will tell you  how to adjust your dosage. Make healthy food choices  The things that you eat and drink affect your blood glucose and your insulin dosage. Making good choices helps to control your diabetes and prevent other health problems. A healthy meal plan includes eating lean proteins, complex carbohydrates, fresh fruits and vegetables, low-fat dairy products, and healthy fats. Make an appointment to see a diet and nutrition specialist (registered dietitian) to help you create an eating plan that is right for you. Make sure that you:  Follow instructions from your health care provider about eating or drinking restrictions.  Drink enough fluid to keep your urine clear or pale yellow.  Eat healthy snacks between nutritious meals.  Track the carbohydrates that you eat. Do this by reading food labels and learning the standard serving sizes of foods.  Follow your sick day plan whenever you cannot eat or drink as usual. Make this plan in advance with your health care provider.  Stay active  Exercise regularly, as told by your health care provider. This may include:  Stretching and doing strength exercises, such as yoga or weightlifting, at least 2 times a week.  Doing at least 150 minutes of moderate-intensity  or vigorous-intensity exercise each week. This could be brisk walking, biking, or water aerobics. ? Spread out your activity over at least 3 days of the week. ? Do not go more than 2 days in a row without doing some kind of physical activity.  When you start a new exercise or activity, work with your health care provider to adjust your insulin, medicines, or food intake as needed. Make healthy lifestyle choices  Do not use any tobacco products, such as cigarettes, chewing tobacco, and e-cigarettes. If you need help quitting, ask your health care provider.  If your health care provider says that alcohol is safe for you, limit alcohol intake to no more than 1 drink per day for nonpregnant women and 2 drinks per day for men. One drink equals 12 oz of beer, 5 oz of wine, or 1 oz of hard liquor.  Learn to manage stress. If you need help with this, ask your health care provider. Care for your body   Keep your immunizations up to date. In addition to getting vaccinations as told by your health care provider, it is recommended that you get vaccinated against the following illnesses: ? The flu (influenza). Get a flu shot every year. ? Pneumonia. ? Hepatitis B.  Schedule an eye exam soon after your diagnosis, and then one time every year after that.  Check your skin and feet every day for cuts, bruises, redness, blisters, or sores. Schedule a foot exam with your health care provider once every year.  Brush your teeth and gums two times a day, and floss at least one time a day. Visit your dentist at least once every 6 months.  Maintain a healthy weight. General instructions  Take over-the-counter and prescription medicines only as told by your health care provider.  Share your diabetes management plan with people in your workplace, school, and household.  Check your urine for ketones when you are ill and as told by your health care provider.  Ask your health care provider: ? Do I need to  meet with a diabetes educator? ? Where can I find a support group for people with diabetes?  Carry a medical alert card or wear medical alert jewelry.  Keep all follow-up visits as told by your health care provider. This is important. Where  to find more information: For more information about diabetes, visit:  American Diabetes Association (ADA): www.diabetes.org  American Association of Diabetes Educators (AADE): www.diabeteseducator.org/patient-resources  This information is not intended to replace advice given to you by your health care provider. Make sure you discuss any questions you have with your health care provider. Document Released: 04/05/2016 Document Revised: 05/20/2016 Document Reviewed: 01/16/2016 Elsevier Interactive Patient Education  2017 [place].      Blood Glucose Monitoring, Adult Monitoring your blood sugar (glucose) helps you manage your diabetes. It also helps you and your health care provider determine how well your diabetes management plan is working. Blood glucose monitoring involves checking your blood glucose as often as directed, and keeping a record (log) of your results over time. Why should I monitor my blood glucose? Checking your blood glucose regularly can:  Help you understand how food, exercise, illnesses, and medicines affect your blood glucose.  Let you know what your blood glucose is at any time. You can quickly tell if you are having low blood glucose (hypoglycemia) or high blood glucose (hyperglycemia).  Help you and your health care provider adjust your medicines as needed.  When should I check my blood glucose? Follow instructions from your health care provider about how often to check your blood glucose.   This may depend on:  The type of diabetes you have.  How well-controlled your diabetes is.  Medicines you are taking.  If you have type 1 diabetes:  Check your blood glucose at least 2 times a day.  Also check your  blood glucose: ? Before every insulin injection. ? Before and after exercise. ? Between meals. ? 2 hours after a meal. ? Occasionally between 2:00 a.m. and 3:00 a.m., as directed. ? Before potentially dangerous tasks, like driving or using heavy machinery. ? At bedtime.  You may need to check your blood glucose more often, up to 6-10 times a day: ? If you use an insulin pump. ? If you need multiple daily injections (MDI). ? If your diabetes is not well-controlled. ? If you are ill. ? If you have a history of severe hypoglycemia. ? If you have a history of not knowing when your blood glucose is getting low (hypoglycemia unawareness).  If you have type 2 diabetes:  If you take insulin or other diabetes medicines, check your blood glucose at least 2 times a day.  If you are on intensive insulin therapy, check your blood glucose at least 4 times a day. Occasionally, you may also need to check between 2:00 a.m. and 3:00 a.m., as directed.  Also check your blood glucose: ? Before and after exercise. ? Before potentially dangerous tasks, like driving or using heavy machinery.  You may need to check your blood glucose more often if: ? Your medicine is being adjusted. ? Your diabetes is not well-controlled. ? You are ill.  What is a blood glucose log?  A blood glucose log is a record of your blood glucose readings. It helps you and your health care provider: ? Look for patterns in your blood glucose over time. ? Adjust your diabetes management plan as needed.  Every time you check your blood glucose, write down your result and notes about things that may be affecting your blood glucose, such as your diet and exercise for the day.  Most glucose meters store a record of glucose readings in the meter. Some meters allow you to download your records to a computer. How do I check  my blood glucose? Follow these steps to get accurate readings of your blood glucose: Supplies  needed   Blood glucose meter.  Test strips for your meter. Each meter has its own strips. You must use the strips that come with your meter.  A needle to prick your finger (lancet). Do not use lancets more than once.  A device that holds the lancet (lancing device).  A journal or log book to write down your results.  Procedure  Wash your hands with soap and water.  Prick the side of your finger (not the tip) with the lancet. Use a different finger each time.  Gently rub the finger until a small drop of blood appears.  Follow instructions that come with your meter for inserting the test strip, applying blood to the strip, and using your blood glucose meter.  Write down your result and any notes.  Alternative testing sites  Some meters allow you to use areas of your body other than your finger (alternative sites) to test your blood.  If you think you may have hypoglycemia, or if you have hypoglycemia unawareness, do not use alternative sites. Use your finger instead.  Alternative sites may not be as accurate as the fingers, because blood flow is slower in these areas. This means that the result you get may be delayed, and it may be different from the result that you would get from your finger.  The most common alternative sites are: ? Forearm. ? Thigh. ? Palm of the hand.  Additional tips  Always keep your supplies with you.  If you have questions or need help, all blood glucose meters have a 24-hour "hotline" number that you can call. You may also contact your health care provider.  After you use a few boxes of test strips, adjust (calibrate) your blood glucose meter by following instructions that came with your meter.    The American Diabetes Association suggests the following targets for most nonpregnant adults with diabetes.  More or less stringent glycemic goals may be appropriate for each individual.  A1C: Less than 7% A1C may also be reported as eAG: Less than  154 mg/dl Before a meal (preprandial plasma glucose): 80-130 mg/dl 1-2 hours after beginning of the meal (Postprandial plasma glucose)*: Less than 180 mg/dl  *Postprandial glucose may be targeted if A1C goals are not met despite reaching preprandial glucose goals.   GOALS in short:  The goals are for the Hgb A1C to be less than 7.0 & blood pressure to be less than 130/80.    It is recommended that all diabetics are educated on and follow a healthy diabetic diet, exercise for 30 minutes 3-4 times per week (walking, biking, swimming, or machine), monitor blood glucose readings and bring that record with you to be reviewed at your next office visit.     You should be checking fasting blood sugars- especially after you eat poorly or eat really healthy, and also check 2 hour postprandial blood sugars after largest meal of the day.    Write these down and bring in your log at each office visit.    You will need to be seen every 3 months by the provider managing your Diabetes unless told otherwise by that provider.   You will need yearly eye exams from an eye specialist and foot exams to check the nerves of your feet.  Also, your urine should be checked yearly as well to make sure excess protein is not present.   If  you are checking your blood pressure at home, please record it and bring it to your next office visit.    Follow the Dietary Approaches to Stop Hypertension (DASH) diet (3 servings of fruit and vegetables daily, whole grains, low sodium, low-fat proteins).  See below.    Lastly, when it comes to your cholesterol, the goal is to have the HDL (good cholesterol) >40, and the LDL (bad cholesterol) <100.   It is recommended that you follow a heart healthy, low saturated and trans-fat diet and exercise for 30 minutes at least 5 times a week.     (( Check out the DASH diet = 1.5 Gram Low Sodium Diet   A 1.5 gram sodium diet restricts the amount of sodium in the diet to no more than 1.5 g or  1500 mg daily.  The American Heart Association recommends Americans over the age of 4 to consume no more than 1500 mg of sodium each day to reduce the risk of developing high blood pressure.  Research also shows that limiting sodium may reduce heart attack and stroke risk.  Many foods contain sodium for flavor and sometimes as a preservative.  When the amount of sodium in a diet needs to be low, it is important to know what to look for when choosing foods and drinks.  The following includes some information and guidelines to help make it easier for you to adapt to a low sodium diet.    QUICK TIPS  Do not add salt to food.  Avoid convenience items and fast food.  Choose unsalted snack foods.  Buy lower sodium products, often labeled as "lower sodium" or "no salt added."  Check food labels to learn how much sodium is in 1 serving.  When eating at a restaurant, ask that your food be prepared with less salt or none, if possible.    READING FOOD LABELS FOR SODIUM INFORMATION  The nutrition facts label is a good place to find how much sodium is in foods. Look for products with no more than 400 mg of sodium per serving.  Remember that 1.5 g = 1500 mg.  The food label may also list foods as:  Sodium-free: Less than 5 mg in a serving.  Very low sodium: 35 mg or less in a serving.  Low-sodium: 140 mg or less in a serving.  Light in sodium: 50% less sodium in a serving. For example, if a food that usually has 300 mg of sodium is changed to become light in sodium, it will have 150 mg of sodium.  Reduced sodium: 25% less sodium in a serving. For example, if a food that usually has 400 mg of sodium is changed to reduced sodium, it will have 300 mg of sodium.    CHOOSING FOODS  Grains  Avoid: Salted crackers and snack items. Some cereals, including instant hot cereals. Bread stuffing and biscuit mixes. Seasoned rice or pasta mixes.  Choose: Unsalted snack items. Low-sodium cereals, oats, puffed wheat and  rice, shredded wheat. English muffins and bread. Pasta.  Meats  Avoid: Salted, canned, smoked, spiced, pickled meats, including fish and poultry. Bacon, ham, sausage, cold cuts, hot dogs, anchovies.  Choose: Low-sodium canned tuna and salmon. Fresh or frozen meat, poultry, and fish.  Dairy  Avoid: Processed cheese and spreads. Cottage cheese. Buttermilk and condensed milk. Regular cheese.  Choose: Milk. Low-sodium cottage cheese. Yogurt. Sour cream. Low-sodium cheese.  Fruits and Vegetables  Avoid: Regular canned vegetables. Regular canned tomato sauce and  paste. Frozen vegetables in sauces. Olives. Angie Fava. Relishes. Sauerkraut.  Choose: Low-sodium canned vegetables. Low-sodium tomato sauce and paste. Frozen or fresh vegetables. Fresh and frozen fruit.  Condiments  Avoid: Canned and packaged gravies. Worcestershire sauce. Tartar sauce. Barbecue sauce. Soy sauce. Steak sauce. Ketchup. Onion, garlic, and table salt. Meat flavorings and tenderizers.  Choose: Fresh and dried herbs and spices. Low-sodium varieties of mustard and ketchup. Lemon juice. Tabasco sauce. Horseradish.    SAMPLE 1.5 GRAM SODIUM MEAL PLAN:   Breakfast / Sodium (mg)  1 cup low-fat milk / 143 mg  1 whole-wheat English muffin / 240 mg  1 tbs heart-healthy margarine / 153 mg  1 hard-boiled egg / 139 mg  1 small orange / 0 mg  Lunch / Sodium (mg)  1 cup raw carrots / 76 mg  2 tbs no salt added peanut butter / 5 mg  2 slices whole-wheat bread / 270 mg  1 tbs jelly / 6 mg   cup red grapes / 2 mg  Dinner / Sodium (mg)  1 cup whole-wheat pasta / 2 mg  1 cup low-sodium tomato sauce / 73 mg  3 oz lean ground beef / 57 mg  1 small side salad (1 cup raw spinach leaves,  cup cucumber,  cup yellow bell pepper) with 1 tsp olive oil and 1 tsp red wine vinegar / 25 mg  Snack / Sodium (mg)  1 container low-fat vanilla yogurt / 107 mg  3 graham cracker squares / 127 mg  Nutrient Analysis  Calories: 1745  Protein: 75 g   Carbohydrate: 237 g  Fat: 57 g  Sodium: 1425 mg  Document Released: 12/13/2005 Document Revised: 08/25/2011 Document Reviewed: 03/16/2010  ExitCare Patient Information 2012 [place].))    This information is not intended to replace advice given to you by your health care provider. Make sure you discuss any questions you have with your health care provider. Document Released: 12/16/2003 Document Revised: 07/02/2016 Document Reviewed: 05/24/2016 Elsevier Interactive Patient Education  2017 [employer].

## 2018-05-11 NOTE — Progress Notes (Signed)
Assessment and plan:  1. Type 2 diabetes mellitus with foot ulcer, without long-term current use of insulin ([place])   2. Mixed diabetic hyperlipidemia associated with type 2 diabetes mellitus ([place])   3. Hypertension associated with diabetes ([place])   4. Left ear pain   5. Hearing loss of left ear due to cerumen impaction   6. Type 2 diabetes mellitus with stage 3 chronic kidney disease, unspecified whether long term insulin use ([place])     1. DM2 -pt asymptomatic and BS are well controlled.  -tolerating meds well without complication. Continue as listed below.  -A1c from 05-09-18 was 6.3, stable from prior where it was 6.3 on 01-11-18. -check your BS at home, both fasting and 2 hour post prandial and keep a log. Bring this into next OV.  -Continue prudent diet and exercise.   2. HLD -pt asymptomatic and tolerating meds well without complication.  -Tolerating meds well without complication. Continue meds as listed below.  -recent blood work shows improvement to LDL which is 57 down from 136, and TG's which is 137 down from 182.  -Continue prudent diet and exercise.   3. HTN -BP well-controlled at home. Pt asymptomatic.  -Continue meds as listed below.  -Check your BP at home and keep a log. Bring this into next OV. -BP goal: <140/90  4. L ear pain/hearing loss in L ear -Do not use any q tips in your ear. -you can use cerumenex in your ear or 1/2 hydrogen peroxide and 1/2 rubbing alcohol. -cerumen impaction done on the L. After removal, L TM normal.  5. CKD -Kidney function has improved. GFR >70, improved from prior where it was 59. -continue drinking adequate amounts of water, equal to half of your wt in oz per day.      Indication: Cerumen impaction of the L ear Medical necessity statement:  On physical examination, cerumen impairs clinically significant portions of the external auditory canal, and tympanic  membrane.  Noted obstructive, copious cerumen that cannot be removed without magnification and instrumentations requiring physician skills Consent:  Discussed benefits and risks of procedure and verbal consent obtained Procedure:   Patient was prepped for the procedure.  Utilized an otoscope to assess and take note of the ear canal, the tympanic membrane, and the presence, amount, and placement of the cerumen. Gentle water irrigation and soft plastic curette was utilized to remove cerumen. Post procedure examination:  shows cerumen was removed, without trauma or injury to the ear canal or TM, which remains intact.   Post-Procedural Ear Care Instructions:    Patient tolerated procedure well.  Proper ear care d/c pt.   The patient is made aware that they may experience temporary vertigo, temporary hearing loss, and temporary discomfort.  If these symptom last for more than 24 hours to call the clinic or proceed to the ED/Urgent Care.    Education and routine counseling performed. Handouts provided.  Orders Placed This Encounter  Procedures  . HM Diabetes Foot Exam  . EAR CERUMEN REMOVAL    No orders of the defined types were placed in this encounter.    Return for 3-4 mo for DM, BP etc.   Anticipatory guidance and routine counseling done re: condition, txmnt options and need for follow up. All questions of patient's were answered.   Gross side effects, risk and benefits, and alternatives of medications discussed with patient.  Patient is aware that all medications have potential side effects and we  are unable to predict every sideeffect or drug-drug interaction that may occur.  Expresses verbal understanding and consents to current therapy plan and treatment regiment.  Please see AVS handed out to patient at the end of our visit for additional patient instructions/ counseling done pertaining to today's office visit.  Note: This document was prepared using Dragon voice recognition software  and may include unintentional dictation errors.    This document serves as a record of services personally performed by Mellody Dance, DO. It was created on her behalf by Mayer Masker, a trained medical scribe. The creation of this record is based on the scribe's personal observations and the provider's statements to them.   I have reviewed the above medical documentation for accuracy and completeness and I concur.  Mellody Dance 05/12/18 10:02 AM  ----------------------------------------------------------------------------------------------------------------------  Subjective:   CC:   Jill Daugherty is a 72 y.o. female who presents to [place] at [facility] today for review and discussion of recent bloodwork that was done.  1. All recent blood work that we ordered was reviewed with patient today.  Patient was counseled on all abnormalities and we discussed dietary and lifestyle changes that could help those values (also medications when appropriate).  Extensive health counseling performed and all patient's concerns/ questions were addressed.   Ear Acutely, she complains of L ear pain that radiates down her L ear/neck that has been bothering her for some time. She states her ear has been itching. She has been using q-tips in her ear.    CHOL HPI:   -  She  is currently managed with:  See med list from today  Txmnt compliance- yes  She has been working on diet/exercise and drinks a lot of water throughout the day.   RUQ pain- na    Muscle aches- na  No other s-e  Last lipid panel as follows:  Lab Results  Component Value Date   CHOL 128 05/09/2018   HDL 44 05/09/2018   LDLCALC 57 05/09/2018   LDLDIRECT 94.0 12/23/2015   TRIG 137 05/09/2018   CHOLHDL 2.9 05/09/2018    Hepatic Function Latest Ref Rng & Units 05/09/2018 01/30/2018 12/23/2015  Total Protein 6.0 - 8.5 g/dL 6.6 7.1 6.9  Albumin 3.5 - 4.8 g/dL 4.5 4.6 4.4  AST 0 - 40 IU/L _0 ALT 0  - 32 IU/L 28 19 38(H)  Alk Phosphatase 39 - 117 IU/L 64 73 83  Total Bilirubin 0.0 - 1.2 mg/dL 0.5 0.3 0.6    DM HPI:  A1c from 05-09-18 was 6.3, stable from prior where it was 6.3 on 01-11-18.   -  She has been working on diet and exercise for diabetes. She has been moving more.   Pt is currently maintained on the following medications for diabetes:   see med list today  Medication compliance - yes   Denies polyuria/polydipsia.  Denies hypo/ hyperglycemia symptoms  Last diabetic eye exam was  Lab Results  Component Value Date   HMDIABEYEEXA No Retinopathy 11/10/2017    Foot exam- UTD- done in office today.   Last A1C in the office was:  Lab Results  Component Value Date   HGBA1C 6.3 (H) 05/09/2018   HGBA1C 6.3 01/11/2018   HGBA1C 6.5 11/08/2017    Lab Results  Component Value Date   MICROALBUR 10 02/07/2018   LDLCALC 57 05/09/2018   CREATININE 0.78 05/09/2018    Wt Readings from Last 3 Encounters:  05/11/18  162 lb 11.2 oz (73.8 kg)  04/10/18 163 lb 8 oz (74.2 kg)  03/20/18 162 lb (73.5 kg)    BP Readings from Last 3 Encounters:  05/11/18 140/89  04/10/18 (!) 145/69  03/20/18 132/72    HTN HPI:  -  Her blood pressure has been controlled at home.  Pt is not checking it at home.   She has not been checking her BP at home. She feels good.    - Patient reports good compliance with blood pressure medications  - Denies medication S-E   - Smoking Status noted   - She denies new onset of: chest pain, exercise intolerance, shortness of breath, dizziness, visual changes, headache, lower extremity swelling or claudication.   Last 3 blood pressure readings in our office are as follows: BP Readings from Last 3 Encounters:  05/11/18 140/89  04/10/18 (!) 145/69  03/20/18 132/72    Filed Weights   05/11/18 1345  Weight: 162 lb 11.2 oz (73.8 kg)     Wt Readings from Last 3 Encounters:  05/11/18 162 lb 11.2 oz (73.8 kg)  04/10/18 163 lb 8 oz (74.2 kg)   03/20/18 162 lb (73.5 kg)   BP Readings from Last 3 Encounters:  05/11/18 140/89  04/10/18 (!) 145/69  03/20/18 132/72   Pulse Readings from Last 3 Encounters:  05/11/18 (!) 59  04/10/18 (!) 56  03/20/18 (!) 55   BMI Readings from Last 3 Encounters:  05/11/18 27.07 kg/m  04/10/18 27.21 kg/m  03/20/18 26.96 kg/m     Patient Care Team    Relationship Specialty Notifications Start End  Mellody Dance, DO PCP - General Family Medicine  12/09/17   Druscilla Brownie, MD Consulting Physician Dermatology  01/11/18   Webb Laws, [place] Referring Physician Optometry  01/11/18   Celedonio Savage, PA-C Consulting Physician Dermatology  01/11/18    Comment: derm     Full medical history updated and reviewed in the office today  Patient Active Problem List   Diagnosis Date Noted  . Hypertension associated with diabetes ([place]) 01/11/2018    Priority: High  . Mixed diabetic hyperlipidemia associated with type 2 diabetes mellitus ([place]) 01/11/2018    Priority: High  . DM2 (diabetes mellitus, type 2) ([place]) 06/23/2015    Priority: High  . Gout 06/23/2015    Priority: Medium  . GERD (gastroesophageal reflux disease) 06/23/2015    Priority: Medium  . Hypothyroidism 06/21/2013    Priority: Medium  . Family history of early CAD- dad in early 60's 02/07/2018    Priority: Low  . Family history of cerebrovascular accident (CVA) in mother- in her 49's 02/07/2018    Priority: Low  . Family history of diabetes mellitus (DM)- PGM- in 40's 02/07/2018    Priority: Low  . Family history of Alzheimer's disease- Mom in her 28 02/07/2018    Priority: Low  . Environmental and seasonal allergies 01/11/2018    Priority: Low  . Vitamin D deficiency 01/11/2018    Priority: Low  . Type 2 diabetes mellitus with diabetic chronic kidney disease ([place]) 05/11/2018  . Acute maxillary sinusitis 04/10/2018  . Acute otitis media 04/10/2018  . Cough in adult 04/10/2018  . Noncompliance with diet and  medication regimen 03/20/2018  . OAB (overactive bladder) 06/23/2015  . HTN (hypertension) 06/21/2013  . Hyperlipidemia 06/21/2013    Past Medical History:  Diagnosis Date  . History of chicken pox   . History of frequent urinary tract infections   . Hyperlipidemia   .  Hypertension   . Overactive bladder   . Thyroid disease     No past surgical history on file.  Social History   Tobacco Use  . Smoking status: Never Smoker  . Smokeless tobacco: Never Used  Substance Use Topics  . Alcohol use: No    Alcohol/week: 0.0 oz    Family Hx: Family History  Problem Relation Age of Onset  . Heart disease Father   . Diabetes Paternal Grandmother   . Cancer Paternal Grandmother   . Breast cancer Neg Hx      Medications: Current Outpatient Medications  Medication Sig Dispense Refill  . allopurinol (ZYLOPRIM) 100 MG tablet Take 1 tablet by mouth daily.    Marland Kitchen amLODipine (NORVASC) 5 MG tablet Take 1 tablet by mouth daily.    Marland Kitchen aspirin EC 81 MG tablet Take 81 mg by mouth daily.    Marland Kitchen atenolol (TENORMIN) 100 MG tablet Take 100 mg by mouth daily.     Marland Kitchen atorvastatin (LIPITOR) 20 MG tablet Take 20 mg by mouth at bedtime.    . Calcium Carbonate (CALCIUM 600 PO) Take by mouth.    . Cholecalciferol (VITAMIN D3) 1000 units CAPS Take 1 capsule by mouth.    . levothyroxine (SYNTHROID, LEVOTHROID) 50 MCG tablet Take 1 tablet by mouth daily.    Marland Kitchen losartan-hydrochlorothiazide (HYZAAR) 50-12.5 MG tablet Take 1 tablet by mouth daily. 90 tablet 3  . metFORMIN (GLUCOPHAGE-XR) 500 MG 24 hr tablet Take 1 tablet by mouth daily.    . ranitidine (ZANTAC) 150 MG capsule Take 150 mg by mouth 2 (two) times daily.    . fluticasone (FLONASE) 50 MCG/ACT nasal spray Place 1 spray into both nostrils 2 (two) times daily for 14 days. 1 g 0   No current facility-administered medications for this visit.     Allergies:  No Known Allergies   Review of Systems: General:   No F/C, wt loss Pulm:   No DIB, SOB,  pleuritic chest pain Card:  No CP, palpitations Abd:  No n/v/d or pain Ext:  No inc edema from baseline  Objective:  Blood pressure 140/89, pulse (!) 59, height _0  (1.651 m), weight 162 lb 11.2 oz (73.8 kg), SpO2 97 %. Body mass index is 27.07 kg/m. Gen:   Well NAD, A and O *3 HEENT:    [place]/AT, EOMI,  MMM L cerumen impacted. R TM WNL. Upon cerumen impaction removal, L TM WNL.  Lungs:   Normal work of breathing. CTA B/L, no Wh, rhonchi Heart:   RRR, S1, S2 WNL's, no MRG Abd:   No gross distention Exts:    warm, pink,  Brisk capillary refill, warm and well perfused.  Psych:    No HI/SI, judgement and insight good, Euthymic mood. Full Affect.   Recent Results (from the past 2160 hour(s))  Magnesium     Status: None   Collection Time: 05/09/18  8:32 AM  Result Value Ref Range   Magnesium 1.8 1.6 - 2.3 mg/dL  Lipid panel     Status: None   Collection Time: 05/09/18  8:32 AM  Result Value Ref Range   Cholesterol, Total 128 100 - 199 mg/dL   Triglycerides 137 0 - 149 mg/dL   HDL 44 >39 mg/dL   VLDL Cholesterol Cal 27 5 - 40 mg/dL   LDL Calculated 57 0 - 99 mg/dL   Chol/HDL Ratio 2.9 0.0 - 4.4 ratio    Comment:  T. Chol/HDL Ratio                                             Men  Women                               1/2 Avg.Risk  3.4    3.3                                   Avg.Risk  5.0    4.4                                2X Avg.Risk  9.6    7.1                                3X Avg.Risk 23.4   11.0   Hemoglobin A1c     Status: Abnormal   Collection Time: 05/09/18  8:32 AM  Result Value Ref Range   Hgb A1c MFr Bld 6.3 (H) 4.8 - 5.6 %    Comment:          Prediabetes: 5.7 - 6.4          Diabetes: >6.4          Glycemic control for adults with diabetes: <7.0    Est. average glucose Bld gHb Est-mCnc 134 mg/dL  Comprehensive metabolic panel     Status: Abnormal   Collection Time: 05/09/18  8:32 AM  Result Value Ref Range   Glucose 125 (H) 65  - 99 mg/dL   BUN 16 8 - 27 mg/dL   Creatinine, Ser 0.78 0.57 - 1.00 mg/dL   GFR calc non Af Amer 76 >59 mL/min/1.73   GFR calc Af Amer 88 >59 mL/min/1.73   BUN/Creatinine Ratio 21 12 - 28   Sodium 143 134 - 144 mmol/L   Potassium 4.7 3.5 - 5.2 mmol/L   Chloride 101 96 - 106 mmol/L   CO2 26 20 - 29 mmol/L   Calcium 10.3 8.7 - 10.3 mg/dL   Total Protein 6.6 6.0 - 8.5 g/dL   Albumin 4.5 3.5 - 4.8 g/dL   Globulin, Total 2.1 1.5 - 4.5 g/dL   Albumin/Globulin Ratio 2.1 1.2 - 2.2   Bilirubin Total 0.5 0.0 - 1.2 mg/dL   Alkaline Phosphatase 64 39 - 117 IU/L   AST 21 0 - 40 IU/L   ALT 28 0 - 32 IU/L  CBC with Differential/Platelet     Status: None   Collection Time: 05/09/18  8:32 AM  Result Value Ref Range   WBC 7.7 3.4 - 10.8 x10E3/uL   RBC 4.57 3.77 - 5.28 x10E6/uL   Hemoglobin 14.2 11.1 - 15.9 g/dL   Hematocrit 42.2 34.0 - 46.6 %   MCV 92 79 - 97 fL   MCH 31.1 26.6 - 33.0 pg   MCHC 33.6 31.5 - 35.7 g/dL   RDW 13.9 12.3 - 15.4 %   Platelets 245 150 - 379 x10E3/uL   Neutrophils 62 Not Estab. %   Lymphs 26 Not Estab. %   Monocytes 9 Not Estab. %   Eos 2  Not Estab. %   Basos 1 Not Estab. %   Neutrophils Absolute 4.8 1.4 - 7.0 x10E3/uL   Lymphocytes Absolute 2.0 0.7 - 3.1 x10E3/uL   Monocytes Absolute 0.7 0.1 - 0.9 x10E3/uL   EOS (ABSOLUTE) 0.2 0.0 - 0.4 x10E3/uL   Basophils Absolute 0.1 0.0 - 0.2 x10E3/uL   Immature Granulocytes 0 Not Estab. %   Immature Grans (Abs) 0.0 0.0 - 0.1 x10E3/uL

## 2018-07-04 ENCOUNTER — Other Ambulatory Visit: Payer: Self-pay

## 2018-07-04 MED ORDER — METFORMIN HCL ER 500 MG PO TB24: 500.0000 mg | ORAL_TABLET | Freq: Every day | ORAL | 1 refills | Status: DC

## 2018-07-04 MED ORDER — AMLODIPINE BESYLATE 5 MG PO TABS: 5.0000 mg | ORAL_TABLET | Freq: Every day | ORAL | 1 refills | Status: DC

## 2018-07-04 NOTE — Telephone Encounter (Addendum)
Pharmacy sent refill request for Norvasc and  Metformin.  Reviewed chart, medications last filled by a previous provider.  LOV 05/11/18.  Please review and advise. MPulliam, CMA/RT(R)

## 2018-09-12 ENCOUNTER — Encounter: Payer: Self-pay | Admitting: Family Medicine

## 2018-09-12 ENCOUNTER — Ambulatory Visit (INDEPENDENT_AMBULATORY_CARE_PROVIDER_SITE_OTHER): Payer: Medicare Other | Admitting: Family Medicine

## 2018-09-12 VITALS — BP 136/78 | HR 59 | Ht 65.0 in | Wt 162.2 lb

## 2018-09-12 DIAGNOSIS — E1159 Type 2 diabetes mellitus with other circulatory complications: Secondary | ICD-10-CM | POA: Diagnosis not present

## 2018-09-12 DIAGNOSIS — I152 Hypertension secondary to endocrine disorders: Secondary | ICD-10-CM

## 2018-09-12 DIAGNOSIS — E118 Type 2 diabetes mellitus with unspecified complications: Secondary | ICD-10-CM | POA: Diagnosis not present

## 2018-09-12 DIAGNOSIS — K219 Gastro-esophageal reflux disease without esophagitis: Secondary | ICD-10-CM

## 2018-09-12 DIAGNOSIS — E1169 Type 2 diabetes mellitus with other specified complication: Secondary | ICD-10-CM

## 2018-09-12 DIAGNOSIS — E559 Vitamin D deficiency, unspecified: Secondary | ICD-10-CM | POA: Diagnosis not present

## 2018-09-12 DIAGNOSIS — E039 Hypothyroidism, unspecified: Secondary | ICD-10-CM

## 2018-09-12 DIAGNOSIS — E782 Mixed hyperlipidemia: Secondary | ICD-10-CM

## 2018-09-12 DIAGNOSIS — K582 Mixed irritable bowel syndrome: Secondary | ICD-10-CM

## 2018-09-12 DIAGNOSIS — K580 Irritable bowel syndrome with diarrhea: Secondary | ICD-10-CM | POA: Insufficient documentation

## 2018-09-12 DIAGNOSIS — B354 Tinea corporis: Secondary | ICD-10-CM

## 2018-09-12 DIAGNOSIS — I10 Essential (primary) hypertension: Secondary | ICD-10-CM

## 2018-09-12 LAB — POCT GLYCOSYLATED HEMOGLOBIN (HGB A1C): HEMOGLOBIN A1C: 6.6 % — AB (ref 4.0–5.6)

## 2018-09-12 MED ORDER — RANITIDINE HCL 150 MG PO CAPS: 150.0000 mg | ORAL_CAPSULE | Freq: Every evening | ORAL | 1 refills | Status: DC

## 2018-09-12 MED ORDER — MICONAZOLE NITRATE 2 % POWD: 1 refills | Status: DC

## 2018-09-12 MED ORDER — ATENOLOL 100 MG PO TABS: 100.0000 mg | ORAL_TABLET | Freq: Every day | ORAL | 1 refills | Status: DC

## 2018-09-12 MED ORDER — SERTRALINE HCL 25 MG PO TABS
50.0000 mg | ORAL_TABLET | Freq: Every day | ORAL | 1 refills | Status: DC
Start: 2018-09-12 — End: 2018-11-07

## 2018-09-12 MED ORDER — LORATADINE 10 MG PO TABS: 10.0000 mg | ORAL_TABLET | Freq: Every day | ORAL | 3 refills | Status: DC

## 2018-09-12 NOTE — Patient Instructions (Addendum)
We are starting you on the sertraline\Zoloft please take a half a tablet for 4 days to make sure your body gets used to the medicine and then if your stomach is okay go to 1 full tablet daily.   Please use the Caldesene powder to the moisture areas of folds of skin twice daily every day.  If it becomes red and little red bumps that are irritated and itchy then please use the medicated prescription powder    Diet for Irritable Bowel Syndrome  When you have irritable bowel syndrome (IBS), the foods you eat and your eating habits are very important. IBS may cause various symptoms, such as abdominal pain, constipation, or diarrhea. Choosing the right foods can help ease discomfort caused by these symptoms. Work with your health care provider and dietitian to find the best eating plan to help control your symptoms. What general guidelines do I need to follow?  Keep a food diary. This will help you identify foods that cause symptoms. Write down: ? What you eat and when. ? What symptoms you have. ? When symptoms occur in relation to your meals.  Avoid foods that cause symptoms. Talk with your dietitian about other ways to get the same nutrients that are in these foods.  Eat more foods that contain fiber. Take a fiber supplement if directed by your dietitian.  Eat your meals slowly, in a relaxed setting.  Aim to eat 5-6 small meals per day. Do not skip meals.  Drink enough fluids to keep your urine clear or pale yellow.  Ask your health care provider if you should take an over-the-counter probiotic during flare-ups to help restore healthy gut bacteria.  If you have cramping or diarrhea, try making your meals low in fat and high in carbohydrates. Examples of carbohydrates are pasta, rice, whole grain breads and cereals, fruits, and vegetables.  If dairy products cause your symptoms to flare up, try eating less of them. You might be able to handle yogurt better than other dairy products because  it contains bacteria that help with digestion. What foods are not recommended? The following are some foods and drinks that may worsen your symptoms:  Fatty foods, such as [place] fries.  Milk products, such as cheese or ice cream.  Chocolate.  Alcohol.  Products with caffeine, such as coffee.  Carbonated drinks, such as soda.  The items listed above may not be a complete list of foods and beverages to avoid. Contact your dietitian for more information. What foods are good sources of fiber? Your health care provider or dietitian may recommend that you eat more foods that contain fiber. Fiber can help reduce constipation and other IBS symptoms. Add foods with fiber to your diet a little at a time so that your body can get used to them. Too much fiber at once might cause gas and swelling of your abdomen. The following are some foods that are good sources of fiber:  Apples.  Peaches.  Pears.  Berries.  Figs.  Broccoli (raw).  Cabbage.  Carrots.  Raw peas.  Kidney beans.  Lima beans.  Whole grain bread.  Whole grain cereal.  Where to find more information: [employer] for Functional Gastrointestinal Disorders: www.iffgd.[employer] of Diabetes and Digestive and Kidney Diseases: NetworkAffair.co.za.aspx This information is not intended to replace advice given to you by your health care provider. Make sure you discuss any questions you have with your health care provider. Document Released: 03/04/2004 Document Revised: 05/20/2016 Document Reviewed: 03/15/2014  Elsevier Interactive Patient Education  2018 Elsevier Inc.   Irritable Bowel Syndrome, Adult Irritable bowel syndrome (IBS) is not one specific disease. It is a group of symptoms that affects the organs responsible for digestion (gastrointestinal or GI tract). To regulate how your GI tract works, your body sends signals  back and forth between your intestines and your brain. If you have IBS, there may be a problem with these signals. As a result, your GI tract does not function normally. Your intestines may become more sensitive and overreact to certain things. This is especially true when you eat certain foods or when you are under stress. There are four types of IBS. These may be determined based on the consistency of your stool:  IBS with diarrhea.  IBS with constipation.  Mixed IBS.  Unsubtyped IBS.  It is important to know which type of IBS you have. Some treatments are more likely to be helpful for certain types of IBS. What are the causes? The exact cause of IBS is not known. What increases the risk? You may have a higher risk of IBS if:  You are a woman.  You are younger than 72 years old.  You have a family history of IBS.  You have mental health problems.  You have had bacterial infection of your GI tract.  What are the signs or symptoms? Symptoms of IBS vary from person to person. The main symptom is abdominal pain or discomfort. Additional symptoms usually include one or more of the following:  Diarrhea, constipation, or both.  Abdominal swelling or bloating.  Feeling full or sick after eating a small or regular-size meal.  Frequent gas.  Mucus in the stool.  A feeling of having more stool left after a bowel movement.  Symptoms tend to come and go. They may be associated with stress, psychiatric conditions, or nothing at all. How is this diagnosed? There is no specific test to diagnose IBS. Your health care provider will make a diagnosis based on a physical exam, medical history, and your symptoms. You may have other tests to rule out other conditions that may be causing your symptoms. These may include:  Blood tests.  X-rays.  CT scan.  Endoscopy and colonoscopy. This is a test in which your GI tract is viewed with a long, thin, flexible tube.  How is this  treated? There is no cure for IBS, but treatment can help relieve symptoms. IBS treatment often includes:  Changes to your diet, such as: ? Eating more fiber. ? Avoiding foods that cause symptoms. ? Drinking more water. ? Eating regular, medium-sized portioned meals.  Medicines. These may include: ? Fiber supplements if you have constipation. ? Medicine to control diarrhea (antidiarrheal medicines). ? Medicine to help control muscle spasms in your GI tract (antispasmodic medicines). ? Medicines to help with any mental health issues, such as antidepressants or tranquilizers.  Therapy. ? Talk therapy may help with anxiety, depression, or other mental health issues that can make IBS symptoms worse.  Stress reduction. ? Managing your stress can help keep symptoms under control.  Follow these instructions at home:  Take medicines only as directed by your health care provider.  Eat a healthy diet. ? Avoid foods and drinks with added sugar. ? Include more whole grains, fruits, and vegetables gradually into your diet. This may be especially helpful if you have IBS with constipation. ? Avoid any foods and drinks that make your symptoms worse. These may include dairy products and caffeinated or  carbonated drinks. ? Do not eat large meals. ? Drink enough fluid to keep your urine clear or pale yellow.  Exercise regularly. Ask your health care provider for recommendations of good activities for you.  Keep all follow-up visits as directed by your health care provider. This is important. Contact a health care provider if:  You have constant pain.  You have trouble or pain with swallowing.  You have worsening diarrhea. Get help right away if:  You have severe and worsening abdominal pain.  You have diarrhea and: ? You have a rash, stiff neck, or severe headache. ? You are irritable, sleepy, or difficult to awaken. ? You are weak, dizzy, or extremely thirsty.  You have bright red  blood in your stool or you have black tarry stools.  You have unusual abdominal swelling that is painful.  You vomit continuously.  You vomit blood (hematemesis).  You have both abdominal pain and a fever. This information is not intended to replace advice given to you by your health care provider. Make sure you discuss any questions you have with your health care provider. Document Released: 12/13/2005 Document Revised: 05/14/2016 Document Reviewed: 08/30/2014 Elsevier Interactive Patient Education  2018 [employer].

## 2018-09-12 NOTE — Progress Notes (Signed)
Impression and Recommendations:    1. Type 2 diabetes mellitus with complication, without long-term current use of insulin (HCC)  -A1c now 6.6, prior it was at 6.3.  This is still at goal and well-controlled for patient's age.  -Remains asymptomatic on the metformin, will continue current medications.  -- Counseled patient on pathophysiology of disease and discussed txmnt which includes dietary and lifestyle modifications as first line.  Importance of low carb/ketogenic diet discussed with patient in addition to regular exercise.   - Check FBS and 2 hours after the biggest meal of your day.  Keep log and bring in next OV for my review.   Also, if you ever feel poorly, please check your blood pressure and blood sugar, as one or the other could be the cause of your symptoms.  - Being a diabetic, you need yearly eye and foot exams.  Make appt.for diabetic eye exam-she is due.      2. Hypertension associated with diabetes ([place])   -Blood pressure at goal   -Patient remains completely asymptomatic and feeling well; continue current medications  -Encouraged to continue ambulatory monitoring and bring in log every OV.  -Patient understands importance of diet and lifestyle to include low-salt diet, weight loss for BMI to be less than 25, increasing activity levels to further lower blood pressure   3. Mixed diabetic hyperlipidemia associated with type 2 diabetes mellitus (HCC)   - Dietary changes such as low saturated & trans fat and low carb diets discussed with patient.  Encouraged regular exercise and weight loss when appropriate.   Educational handouts provided at patient's desire.  Continue current medication(s).  She is tolerating statin well without complaints.    Also, risks and benefits of medications discussed with patient, including alternative treatments.     -We also did review each and every medication on her list today and the importance of what each 1 is for.  She had  questions about when to take them- morning afternoon or night etc.  Contact us prior with any Q's/ concerns.  4. Gastroesophageal reflux disease, esophagitis presence not specified  -Refilled medicine for patient today.  -She will continue only to take as needed and once daily.  -Dietary lifestyle changes such as not eating trigger foods and not laying down within 3 hours of eating or drinking anything   5. Vitamin D deficiency   -  Discussed importance of vitamin D (as well as calcium)  to their health and well-being.   -We reviewed possible symptoms of low Vitamin D including low energy, muscle aches, joint aches etc.    2000 IUs daily discussed with patient.    -Well-controlled at 48.9 when checked approximately 6 months ago.  Continue current dose    6. Irritable bowel syndrome with both constipation and diarrhea -symptoms brought on by anxiety  -This is a new problem to me, although patient has had the symptoms for some time but has never seen GI  -We discussed the importance of adequate hydration  (which patient barely drinks to 8 ounce bottles of water daily ) and also over-the-counter fiber supplements, when to use MiraLAX versus Imodium etc.    -Extensive discussion regarding probiotics, special foods, etc.  -After discussion patient claimed emotions and anxiety highly correlated to increase in IBS symptoms.  After extended discussion we decided to start her on a low-dose SSRI sertraline for her IBS   -She understands this medicine is a mood medicine and will help with  her anxiety as well which should control her IBS  - Pt was in the office today for 32.5+ minutes, with over 50% time spent in face to face counseling of patients various medical conditions, treatment plans of those medical conditions including medicine management and lifestyle modification, strategies to improve health and well being; and in coordination of care. SEE ABOVE AND BELOW TREATMENT PLANS FOR  DETAILS   7.  Hypothyroidism  -Patient told to take her Synthroid as far away from other medicines and vitamins as possible.  -TSH well controlled and checked less than 12 months ago.  Continue current dose.  8. Tinea corporis- lower abd folds  -This is a new problem to me.  -We discussed pathophysiology of this disease and what it comes from- moist skin never seen the light of day  -Patient told to use Caldesene powder over-the-counter as she is currently doing which helps keep it under control/ keep area dry.    - Miconazole powder prescribed and only to be used when she gets red bumps in the skin folds and becomes itchy/  irritated feeling.     The patient was counseled, risk factors were discussed, anticipatory guidance given.  Orders Placed This Encounter  Procedures  . POCT glycosylated hemoglobin (Hb A1C)    Medications Discontinued During This Encounter  Medication Reason  . atenolol (TENORMIN) 100 MG tablet Reorder  . ranitidine (ZANTAC) 150 MG capsule Reorder      Meds ordered this encounter  Medications  . sertraline (ZOLOFT) 25 MG tablet    Sig: Take 2 tablets (50 mg total) by mouth daily after breakfast.    Dispense:  90 tablet    Refill:  1  . Miconazole Nitrate 2 % POWD    Sig: Apply to affected folds of skin twice daily only when red\ irritated    Dispense:  1 Bottle    Refill:  1  . atenolol (TENORMIN) 100 MG tablet    Sig: Take 1 tablet (100 mg total) by mouth daily.    Dispense:  90 tablet    Refill:  1  . ranitidine (ZANTAC) 150 MG capsule    Sig: Take 1 capsule (150 mg total) by mouth every evening.    Dispense:  90 capsule    Refill:  1  . loratadine (CLARITIN) 10 MG tablet    Sig: Take 1 tablet (10 mg total) by mouth daily.    Dispense:  90 tablet    Refill:  3    Return for f/up in 8 wks after starting zoloft sooner if concerns.   Gross side effects, risk and benefits, and alternatives of medications discussed with patient.  Patient is  aware that all medications have potential side effects and we are unable to predict every side effect or drug-drug interaction that may occur.  Expresses verbal understanding and consents to current therapy plan and treatment regimen.  Please see AVS handed out to patient at the end of our visit for further patient instructions/ counseling done pertaining to today's office visit.    Note:  This document was prepared using Dragon voice recognition software and may include unintentional dictation errors.  I have reviewed the above documentation for accuracy and completeness, and I agree with the above.   Mellody Dance 09/12/18 6:23 PM    Subjective:    Chief Complaint  Patient presents with  . Follow-up     Jill Daugherty is a 72 y.o. female who presents to [facility] today for Diabetes Management.    Acute concerns: Trouble going to the bathroom- at times constipated and other times has diarrhea.  She has had IBS symptoms for many years and this always seems to be preceded by her getting emotional and anxious over various things in her life.  She has never seen a gastroenterologist in the past.  Symptoms have never been so bad she needed to see be seen by one.  She never has accidents in her pants but does have to plan out her day around when she eats and when her bowel movements are etc.  She declines desire to go to gastroeurologist today.  Acute concerns: Patient has had trouble with Candida infection between skin folds of lower abdomen ever since "her hysterectomy when she used to see her GYN ".  She uses over-the-counter powder which keeps it at [place] but at times it gets red, itchy and irritated.  It is not aggravated today but she wishes to discuss it with me  1. HTN HPI:  -   Pt  is not checking it at home.   - Patient reports good compliance with blood pressure medications  - Denies medication S-E   - Smoking Status -negative  - She denies  new onset of: chest pain, exercise intolerance, shortness of breath, dizziness, visual changes, headache, lower extremity swelling or claudication.   Today their BP is BP: 136/78   Last 3 blood pressure readings in our office are as follows: BP Readings from Last 3 Encounters:  09/12/18 136/78  05/11/18 140/89  04/10/18 (!) 145/69    Filed Weights   09/12/18 1339  Weight: 162 lb 3.2 oz (73.6 kg)      DM HPI: -  She has been working on diet and exercise for diabetes.  Walking around [employer] when too hot outside.   Pt is currently maintained on the following medications for diabetes:   see med list today Medication compliance - good- taking meds daily  Home glucose readings range FBS  118, 142, 145, 148 highest;  Lowest 118   Denies polyuria/polydipsia. Denies hypo/ hyperglycemia symptoms - She denies new onset of: chest pain, exercise intolerance, shortness of breath, dizziness, visual changes, headache, lower extremity swelling or claudication.   Last diabetic eye exam was  Lab Results  Component Value Date   HMDIABEYEEXA No Retinopathy 11/10/2017    Foot exam- UTD  Last A1C in the office was:  Lab Results  Component Value Date   HGBA1C 6.6 (A) 09/12/2018   HGBA1C 6.3 (H) 05/09/2018   HGBA1C 6.3 01/11/2018    Lab Results  Component Value Date   MICROALBUR 10 02/07/2018   [place] 57 05/09/2018   CREATININE 0.78 05/09/2018    CHOL HPI:   -  She  is currently managed with: Lipitor nightly  Txmnt compliance- excellent, without side effects  Patient reports very little compliance with low chol/ saturated and trans fat diet.  No exercise  RUQ pain- no    Muscle aches- no; but poor water intake barely takes in any.  No other s-e  Last lipid panel as follows:  Lab Results  Component Value Date   CHOL 128 05/09/2018   HDL 44 05/09/2018   LDLCALC 57 05/09/2018   LDLDIRECT 94.0 12/23/2015   TRIG 137 05/09/2018   CHOLHDL 2.9 05/09/2018    Hepatic  Function Latest Ref Rng & Units 05/09/2018 01/30/2018 04/05/2017  Total Protein 6.0 - 8.5 g/dL 6.6 7.1 -  Albumin 3.5 - 4.8 g/dL 4.5 4.6 -  AST 0 - 40 IU/L '21 16 25  '$ ALT 0 - 32 IU/L 28 19 37(A)  Alk Phosphatase 39 - 117 IU/L 64 73 65  Total Bilirubin 0.0 - 1.2 mg/dL 0.5 0.3 -       Last 3 blood pressure readings in our office are as follows: BP Readings from Last 3 Encounters:  09/12/18 136/78  05/11/18 140/89  04/10/18 (!) 145/69    BMI Readings from Last 3 Encounters:  09/12/18 26.99 kg/m  05/11/18 27.07 kg/m  04/10/18 27.21 kg/m     Problem  Type 2 Diabetes Mellitus With Diabetic Chronic Kidney Disease (Hcc)  Irritable Bowel Syndrome With Both Constipation and Diarrhea  Tinea corporis- lower abd folds      Patient Care Team    Relationship Specialty Notifications Start End  Mellody Dance, DO PCP - General Family Medicine  12/09/17   Druscilla Brownie, MD Consulting Physician Dermatology  01/11/18   Webb Laws, [place] Referring Physician Optometry  01/11/18   Celedonio Savage, PA-C Consulting Physician Dermatology  01/11/18    Comment: derm      Patient Active Problem List   Diagnosis Date Noted  . Type 2 diabetes mellitus with diabetic chronic kidney disease ([place]) 05/11/2018    Priority: High  . Hypertension associated with diabetes ([place]) 01/11/2018    Priority: High  . Mixed diabetic hyperlipidemia associated with type 2 diabetes mellitus ([place]) 01/11/2018    Priority: High  . DM2 (diabetes mellitus, type 2) ([place]) 06/23/2015    Priority: High  . Gout 06/23/2015    Priority: Medium  . GERD (gastroesophageal reflux disease) 06/23/2015    Priority: Medium  . Hypothyroidism 06/21/2013    Priority: Medium  . Family history of early CAD- dad in early 60's 02/07/2018    Priority: Low  . Family history of cerebrovascular accident (CVA) in mother- in her 78's 02/07/2018    Priority: Low  . Family history of diabetes mellitus (DM)- PGM- in 40's 02/07/2018     Priority: Low  . Family history of Alzheimer's disease- Mom in her 14 02/07/2018    Priority: Low  . Environmental and seasonal allergies 01/11/2018    Priority: Low  . Vitamin D deficiency 01/11/2018    Priority: Low  . Irritable bowel syndrome with both constipation and diarrhea 09/12/2018  . Tinea corporis- lower abd folds 09/12/2018  . Acute maxillary sinusitis 04/10/2018  . Acute otitis media 04/10/2018  . Cough in adult 04/10/2018  . Noncompliance with diet and medication regimen 03/20/2018  . OAB (overactive bladder) 06/23/2015  . HTN (hypertension) 06/21/2013  . Hyperlipidemia 06/21/2013     Past Medical History:  Diagnosis Date  . History of chicken pox   . History of frequent urinary tract infections   . Hyperlipidemia   . Hypertension   . Overactive bladder   . Thyroid disease      History reviewed. No pertinent surgical history.   Family History  Problem Relation Age of Onset  . Heart disease Father   . Diabetes Paternal Grandmother   . Cancer Paternal Grandmother   . Breast cancer Neg Hx      Social History   Substance and Sexual Activity  Drug Use No  ,  Social History   Substance and Sexual Activity  Alcohol Use No  . Alcohol/week: 0.0 standard drinks  ,  Social History   Tobacco Use  Smoking Status Never Smoker  Smokeless Tobacco Never Used  ,  Current Outpatient Medications on File Prior to Visit  Medication Sig Dispense Refill  . allopurinol (ZYLOPRIM) 100 MG tablet Take 1 tablet by mouth daily.    Marland Kitchen amLODipine (NORVASC) 5 MG tablet Take 1 tablet (5 mg total) by mouth daily. 90 tablet 1  . aspirin EC 81 MG tablet Take 81 mg by mouth daily.    Marland Kitchen atorvastatin (LIPITOR) 20 MG tablet Take 20 mg by mouth at bedtime.    . Calcium Carbonate (CALCIUM 600 PO) Take by mouth.    . Cholecalciferol (VITAMIN D3) 1000 units CAPS Take 2 capsules by mouth.    . levothyroxine (SYNTHROID, LEVOTHROID) 50 MCG tablet Take 1 tablet by mouth daily.     Marland Kitchen losartan-hydrochlorothiazide (HYZAAR) 50-12.5 MG tablet Take 1 tablet by mouth daily. 90 tablet 3  . metFORMIN (GLUCOPHAGE-XR) 500 MG 24 hr tablet Take 1 tablet (500 mg total) by mouth daily. 90 tablet 1  . fluticasone (FLONASE) 50 MCG/ACT nasal spray Place 1 spray into both nostrils 2 (two) times daily for 14 days. 1 g 0   No current facility-administered medications on file prior to visit.      No Known Allergies   Review of Systems:   General:  Denies fever, chills Optho/Auditory:   Denies visual changes, blurred vision Respiratory:   Denies SOB, cough, wheeze, DIB  Cardiovascular:   Denies chest pain, palpitations, painful respirations Gastrointestinal:   Denies nausea, vomiting, diarrhea.  Endocrine:     Denies new hot or cold intolerance Musculoskeletal:  Denies joint swelling, gait issues, or new unexplained myalgias/ arthralgias Skin:  Denies rash, suspicious lesions  Neurological:    Denies dizziness, unexplained weakness, numbness  Psychiatric/Behavioral:   Denies mood changes    Objective:     Blood pressure 136/78, pulse (!) 59, height '5\' 5"'$  (1.651 m), weight 162 lb 3.2 oz (73.6 kg), SpO2 97 %.  Body mass index is 26.99 kg/m.  General: Well Developed, well nourished, and in no acute distress.  HEENT: Normocephalic, atraumatic, pupils equal round reactive to light, neck supple, No carotid bruits, no JVD Skin: Warm and dry, cap RF less 2 sec; in suprapubic skin fold of her lower abdomen-very moist appearing skin however no erythematous papules or evidence of tinea Cardiac: Regular rate and rhythm, S1, S2 WNL's, no murmurs rubs or gallops Respiratory: ECTA B/L, Not using accessory muscles, speaking in full sentences. Abd: Bowel sounds x4, no guarding rigidity or rebound, no organomegaly. NeuroM-Sk: Ambulates w/o assistance, moves ext * 4 w/o difficulty, sensation grossly intact.  Ext: scant edema b/l lower ext Psych: No HI/SI, judgement and insight good, Euthymic  mood. Full Affect.

## 2018-10-05 ENCOUNTER — Ambulatory Visit (INDEPENDENT_AMBULATORY_CARE_PROVIDER_SITE_OTHER): Payer: Medicare Other

## 2018-10-05 VITALS — BP 124/73 | HR 55

## 2018-10-05 DIAGNOSIS — Z23 Encounter for immunization: Secondary | ICD-10-CM

## 2018-10-05 NOTE — Addendum Note (Signed)
Addended by: Lanier Prude D on: 10/05/2018 02:22 PM   Modules accepted: Orders, Level of Service

## 2018-10-05 NOTE — Progress Notes (Signed)
Patient is here for flu vaccine.  Patient tolerated injection well. MPulliam, CMA/RT(R)  

## 2018-10-11 ENCOUNTER — Other Ambulatory Visit: Payer: Self-pay

## 2018-10-11 NOTE — Patient Outreach (Signed)
[facility]) Care Management  10/11/2018  Kalyani Maeda Luster [birth year] [id number]   Medication Adherence call to Mrs. Katha  Suess  patient answer but, hung up the phone did not want to engage patient is due on Atorvastatin 20 mg under [place].   [place] Management Direct Dial [phone]  Fax [phone] [email or web address]

## 2018-10-27 ENCOUNTER — Other Ambulatory Visit: Payer: Self-pay

## 2018-10-27 DIAGNOSIS — E039 Hypothyroidism, unspecified: Secondary | ICD-10-CM

## 2018-10-27 NOTE — Telephone Encounter (Signed)
Pharmacy sent refill request for Synthroid.  Medication was last filled by a pervious provider.  LOV 09/12/18 and follow up appointment 11/07/18.  Last TSH level was 2.890 on 01/30/18 and free T4 was 1.26 on the same date.  Please review and advise. MPulliam, CMA/RT(R)

## 2018-10-28 MED ORDER — LEVOTHYROXINE SODIUM 50 MCG PO TABS: 50.0000 ug | ORAL_TABLET | Freq: Every day | ORAL | 1 refills | Status: DC

## 2018-11-07 ENCOUNTER — Encounter: Payer: Self-pay | Admitting: Family Medicine

## 2018-11-07 ENCOUNTER — Ambulatory Visit (INDEPENDENT_AMBULATORY_CARE_PROVIDER_SITE_OTHER): Payer: Medicare Other | Admitting: Family Medicine

## 2018-11-07 VITALS — BP 117/79 | HR 64 | Temp 97.5°F | Ht 65.0 in | Wt 163.9 lb

## 2018-11-07 DIAGNOSIS — I152 Hypertension secondary to endocrine disorders: Secondary | ICD-10-CM

## 2018-11-07 DIAGNOSIS — K582 Mixed irritable bowel syndrome: Secondary | ICD-10-CM

## 2018-11-07 DIAGNOSIS — E1159 Type 2 diabetes mellitus with other circulatory complications: Secondary | ICD-10-CM | POA: Diagnosis not present

## 2018-11-07 DIAGNOSIS — I10 Essential (primary) hypertension: Secondary | ICD-10-CM

## 2018-11-07 DIAGNOSIS — K219 Gastro-esophageal reflux disease without esophagitis: Secondary | ICD-10-CM

## 2018-11-07 DIAGNOSIS — R32 Unspecified urinary incontinence: Secondary | ICD-10-CM | POA: Diagnosis not present

## 2018-11-07 MED ORDER — MIRABEGRON ER 25 MG PO TB24: 25.0000 mg | ORAL_TABLET | Freq: Every day | ORAL | 0 refills | Status: DC

## 2018-11-07 MED ORDER — FAMOTIDINE 20 MG PO TABS: 20.0000 mg | ORAL_TABLET | Freq: Two times a day (BID) | ORAL | 1 refills | Status: DC

## 2018-11-07 MED ORDER — SERTRALINE HCL 25 MG PO TABS: 25.0000 mg | ORAL_TABLET | Freq: Every day | ORAL | 1 refills | Status: DC

## 2018-11-07 MED ORDER — ATENOLOL 100 MG PO TABS: 50.0000 mg | ORAL_TABLET | Freq: Every day | ORAL | 1 refills | Status: DC

## 2018-11-07 NOTE — Progress Notes (Signed)
Impression and Recommendations:    1. Hypertension associated with diabetes ([place])   2. Irritable bowel syndrome with both constipation and diarrhea   3. Urinary incontinence in female   4. Gastroesophageal reflux disease, esophagitis presence not specified     1. IBS Management & Mood - Zoloft - Continue on Zoloft 25 mg after breakfast.  See med list today. - IBS sx and mood well controlled at this time. - Patient tolerating meds well.   2. Incontinence - Urinary Incontinence in Female - Patient with distress today regarding occasional incontinence.  Noted "ruining her clothes" at times and never knowing "when it slips up on her."  - Begin medication today.  Myrbetriq. - pt declines PT for female incontinence - Discussed S-E of medication, including dry mouth and possible predisposition to UTI. - Discussed and educated patient regarding several options and recommendations.  - Recommended that the patient begin implementing [place] Exercises.  - Strongly recommended follow-up with physical therapy to strengthen pelvic floor.  Patient declined physical therapy at this time.  - Will continue to monitor closely.  Patient knows to let us know if she changes her mind about physical therapy.  Reviewed at length the importance of the patient following up with physical therapy, GI or Urology for related concerns.   3. Hypertension - Blood pressure is running somewhat low per ambulatory log. - Recommended change in treatment plan today.  See med list below. - Reduce dose of atenolol to 50 mg daily; patient will cut tablet in half. - Discussed goal BP as 120's-130's with pulse in the 60's/70's. - Patient knows to watch for spikes in BP and knows that this is not the goal.  - Patient tolerating meds well without complication.  Denies S-E  - Lifestyle changes such as dash diet and engaging in a regular exercise program discussed with patient.  - Ambulatory BP monitoring encouraged.  Check blood pressure three times a week, keep log and bring in next OV.   4. GERD - Pepcid AC prescribed today to replace Zantac (which was taken off the market). - Patient will continue to monitor.   5. BMI Counseling Explained to patient what BMI refers to, and what it means medically.    Told patient to think about it as a "medical risk stratification measurement" and how increasing BMI is associated with increasing risk/ or worsening state of various diseases such as hypertension, hyperlipidemia, diabetes, premature OA, depression etc.  American Heart Association guidelines for healthy diet, basically Mediterranean diet, and exercise guidelines of 30 minutes 5 days per week or more discussed in detail.  Health counseling performed.  All questions answered.   6. Lifestyle & Preventative Health Maintenance - Advised patient to continue working toward exercising to improve overall mental, physical, and emotional health.    - Encouraged patient to engage in daily physical activity, especially a formal exercise routine.  Recommended that the patient eventually strive for at least 150 minutes of moderate cardiovascular activity per week according to guidelines established by the [facility].   - Healthy dietary habits encouraged, including low-carb, and high amounts of lean protein in diet.   - Patient should also consume adequate amounts of water.   Education and routine counseling performed. Handouts provided.   7. Follow-Up - Prescriptions refilled today. - Return in 2 months for chronic follow-up and check-in on mood and incontinence. - Re-check fasting lab work as recommended.  - Otherwise, continue to return for CPE and chronic follow-up  as scheduled.   - Patient knows to call in sooner if desired to address acute concerns.    Medications Discontinued During This Encounter  Medication Reason  . ranitidine (ZANTAC) 150 MG capsule Not available  . losartan-hydrochlorothiazide  (HYZAAR) 50-12.5 MG tablet Change in therapy  . sertraline (ZOLOFT) 25 MG tablet   . atenolol (TENORMIN) 100 MG tablet       Meds ordered this encounter  Medications  . famotidine (PEPCID) 20 MG tablet    Sig: Take 1 tablet (20 mg total) by mouth 2 (two) times daily.    Dispense:  180 tablet    Refill:  1  . sertraline (ZOLOFT) 25 MG tablet    Sig: Take 1 tablet (25 mg total) by mouth daily after breakfast.    Dispense:  90 tablet    Refill:  1  . atenolol (TENORMIN) 100 MG tablet    Sig: Take 0.5 tablets (50 mg total) by mouth daily.    Dispense:  90 tablet    Refill:  1  . mirabegron ER (MYRBETRIQ) 25 MG TB24 tablet    Sig: Take 1 tablet (25 mg total) by mouth daily. for urinary incontinence    Dispense:  90 tablet    Refill:  0     Gross side effects, risk and benefits, and alternatives of medications and treatment plan in general discussed with patient.  Patient is aware that all medications have potential side effects and we are unable to predict every side effect or drug-drug interaction that may occur.   Patient will call with any questions prior to using medication if they have concerns.  Expresses verbal understanding and consents to current therapy and treatment regimen.  No barriers to understanding were identified.  Red flag symptoms and signs discussed in detail.  Patient expressed understanding regarding what to do in case of emergency\urgent symptoms  Please see AVS handed out to patient at the end of our visit for further patient instructions/ counseling done pertaining to today's office visit.   Return for 2 months, decrease atenolol, added Myrbetriq, changed to Pepcid, decrease Zoloft to 25 for IBS.     Note:  This document was prepared using Dragon voice recognition software and may include unintentional dictation errors.   This document serves as a record of services personally performed by Mellody Dance, DO. It was created on her behalf by Toni Amend, a trained medical scribe. The creation of this record is based on the scribe's personal observations and the provider's statements to them.   I have reviewed the above medical documentation for accuracy and completeness and I concur.  Mellody Dance, DO, D.O. 11/07/2018 11:15 AM    -----------------------------------------------------------------------------------------------------------------   Subjective:    CC:  Chief Complaint  Patient presents with  . Follow-up    HPI: Jill Daugherty is a 72 y.o. female who presents to [facility] [facility] today for follow-up of mood.   Patient here to follow up on progress starting Zoloft.  Mood & IBS Since starting the Zoloft, she's felt less anxious, more relaxed.  She started on two tablets and is now taking one tablet daily.  She made the decision herself to cut back to one tablet; confirms that she feels good on one tablet daily.  Says "I feel a lot better."  Less stressed, less anxious.  She feels happier, "I don't feel like I'm ill [angry]," confirms she's happier and people keep saying "You sure have changed."  Gastrointestinal & Urinary Concerns Patient notes "I need something to help me quit going to the bathroom in the mornings."  She says "I never know, it just slips up on me."  States "I ruined my clothes twice last week."  Also notes that her stomach was upset this morning.  Feels her stomach gets upset "just about every morning."  Notes she often has to run to the bathroom with a bowel movement.  Comments that she went to GI or Urology before she came here "and everything was fine."  She visited a doctor that, per patient, has been retired for the past 2-3 years.  Notes again that sometimes her incontinence just "slips up on me."  Says "I just have to touch water and I've gotta go."  GERD Patient notes that all of the Zantac has been taken off the market. She plans to use omeprazole pr  Pepcid AC.  1. HTN HPI:  -  Her blood pressure has been controlled at home, but running slightly low.  Pt is periodically checking it at home.   Blood pressures at home: 113/55, 108/54, 118/53, 103/50, 101/46, 108/53, 113/50, 128/63, 112/52, 113/50.  Patient denies lightheadedness.  - Patient reports good compliance with blood pressure medications  - Denies medication S-E   - Smoking Status noted   - She denies new onset of: chest pain, exercise intolerance, shortness of breath, dizziness, visual changes, headache, lower extremity swelling or claudication.   Last 3 blood pressure readings in our office are as follows: BP Readings from Last 3 Encounters:  11/07/18 117/79  10/05/18 124/73  09/12/18 136/78    Filed Weights   11/07/18 1040  Weight: 163 lb 14.4 oz (74.3 kg)    Depression screen [facility] 2/9 11/07/2018 09/12/2018 04/10/2018  Decreased Interest 0 0 0  Down, Depressed, Hopeless 0 0 0  PHQ - 2 Score 0 0 0  Altered sleeping 0 0 0  Tired, decreased energy 0 0 0  Change in appetite 0 0 0  Feeling bad or failure about yourself  0 0 0  Trouble concentrating 0 0 0  Moving slowly or fidgety/restless 0 0 0  Suicidal thoughts 0 0 0  PHQ-9 Score 0 0 0  Difficult doing work/chores Not difficult at all Not difficult at all -     GAD 7 : Generalized Anxiety Score 11/07/2018  Nervous, Anxious, on Edge 0  Control/stop worrying 0  Worry too much - different things 0  Trouble relaxing 0  Restless 0  Easily annoyed or irritable 0  Afraid - awful might happen 0  Total GAD 7 Score 0  Anxiety Difficulty Not difficult at all     Wt Readings from Last 3 Encounters:  11/07/18 163 lb 14.4 oz (74.3 kg)  09/12/18 162 lb 3.2 oz (73.6 kg)  05/11/18 162 lb 11.2 oz (73.8 kg)   BP Readings from Last 3 Encounters:  11/07/18 117/79  10/05/18 124/73  09/12/18 136/78   Pulse Readings from Last 3 Encounters:  11/07/18 64  10/05/18 (!) 55  09/12/18 (!) 59   BMI Readings from Last 3  Encounters:  11/07/18 27.27 kg/m  09/12/18 26.99 kg/m  05/11/18 27.07 kg/m         Patient Care Team    Relationship Specialty Notifications Start End  Mellody Dance, DO PCP - General Family Medicine  12/09/17   Druscilla Brownie, MD Consulting Physician Dermatology  01/11/18   Webb Laws, [place] Referring Physician Optometry  01/11/18   Percell Miller,  Concha Pyo Consulting Physician Dermatology  01/11/18    Comment: derm      Patient Active Problem List   Diagnosis Date Noted  . Type 2 diabetes mellitus with diabetic chronic kidney disease ([place]) 05/11/2018    Priority: High  . Hypertension associated with diabetes ([place]) 01/11/2018    Priority: High  . Mixed diabetic hyperlipidemia associated with type 2 diabetes mellitus ([place]) 01/11/2018    Priority: High  . DM2 (diabetes mellitus, type 2) ([place]) 06/23/2015    Priority: High  . Gout 06/23/2015    Priority: Medium  . GERD (gastroesophageal reflux disease) 06/23/2015    Priority: Medium  . Hypothyroidism 06/21/2013    Priority: Medium  . Family history of early CAD- dad in early 60's 02/07/2018    Priority: Low  . Family history of cerebrovascular accident (CVA) in mother- in her 70's 02/07/2018    Priority: Low  . Family history of diabetes mellitus (DM)- PGM- in 40's 02/07/2018    Priority: Low  . Family history of Alzheimer's disease- Mom in her 106 02/07/2018    Priority: Low  . Environmental and seasonal allergies 01/11/2018    Priority: Low  . Vitamin D deficiency 01/11/2018    Priority: Low  . Urinary incontinence in female 11/07/2018  . Gastroesophageal reflux disease 11/07/2018  . Irritable bowel syndrome with both constipation and diarrhea 09/12/2018  . Tinea corporis- lower abd folds 09/12/2018  . Acute maxillary sinusitis 04/10/2018  . Acute otitis media 04/10/2018  . Cough in adult 04/10/2018  . Noncompliance with diet and medication regimen 03/20/2018  . OAB (overactive bladder) 06/23/2015  . HTN  (hypertension) 06/21/2013  . Hyperlipidemia 06/21/2013    Past Medical history, Surgical history, Family history, Social history, Allergies and Medications have been entered into the medical record, reviewed and changed as needed.    Current Meds  Medication Sig  . allopurinol (ZYLOPRIM) 100 MG tablet Take 1 tablet by mouth daily.  Marland Kitchen amLODipine (NORVASC) 5 MG tablet Take 1 tablet (5 mg total) by mouth daily.  Marland Kitchen aspirin EC 81 MG tablet Take 81 mg by mouth daily.  Marland Kitchen atenolol (TENORMIN) 100 MG tablet Take 0.5 tablets (50 mg total) by mouth daily.  Marland Kitchen atorvastatin (LIPITOR) 20 MG tablet Take 20 mg by mouth at bedtime.  . Calcium Carbonate (CALCIUM 600 PO) Take by mouth.  . Cholecalciferol (VITAMIN D3) 1000 units CAPS Take 2 capsules by mouth.  . hydrochlorothiazide (HYDRODIURIL) 12.5 MG tablet Take 12.5 mg by mouth daily.  Marland Kitchen levothyroxine (SYNTHROID, LEVOTHROID) 50 MCG tablet Take 1 tablet (50 mcg total) by mouth daily.  Marland Kitchen loratadine (CLARITIN) 10 MG tablet Take 1 tablet (10 mg total) by mouth daily.  Marland Kitchen losartan (COZAAR) 50 MG tablet Take 50 mg by mouth daily.  . metFORMIN (GLUCOPHAGE-XR) 500 MG 24 hr tablet Take 1 tablet (500 mg total) by mouth daily.  . Miconazole Nitrate 2 % POWD Apply to affected folds of skin twice daily only when red\ irritated  . sertraline (ZOLOFT) 25 MG tablet Take 1 tablet (25 mg total) by mouth daily after breakfast.  . [DISCONTINUED] atenolol (TENORMIN) 100 MG tablet Take 1 tablet (100 mg total) by mouth daily.  . [DISCONTINUED] sertraline (ZOLOFT) 25 MG tablet Take 2 tablets (50 mg total) by mouth daily after breakfast.    Allergies:  No Known Allergies   Review of Systems: Review of Systems: General:   No F/C, wt loss Pulm:   No DIB, SOB, pleuritic chest pain Card:  No CP, palpitations Abd:  No n/v/d or pain Ext:  No inc edema from baseline Psych: no SI/ HI    Objective:   Blood pressure 117/79, pulse 64, temperature (!) 97.5 F (36.4 C), height  5\' 5"  (1.651 m), weight 163 lb 14.4 oz (74.3 kg), SpO2 97 %. Body mass index is 27.27 kg/m. General:  Well Developed, well nourished, appropriate for stated age.  Neuro:  Alert and oriented,  extra-ocular muscles intact  HEENT:  Normocephalic, atraumatic, neck supple, no carotid bruits appreciated  Skin:  no gross rash, warm, pink. Cardiac:  RRR, S1 S2 Respiratory:  ECTA B/L and A/P, Not using accessory muscles, speaking in full sentences- unlabored. Vascular:  Ext warm, no cyanosis apprec.; cap RF less 2 sec. Psych:  No HI/SI, judgement and insight good, Euthymic mood. Full Affect.

## 2018-11-07 NOTE — Patient Instructions (Signed)
-For your irritable bowel syndrome, continue to take the Zoloft\sertraline daily.  This should help with your irritable bowel symptoms, as well as your mood  Also please cut your atenolol dose in half.  Please monitor your blood pressure and pulse and bring in in 2 months.  Follow-up sooner than planned if any problems or concerns  Also I wrote you a prescription for Pepcid\famotidine over-the-counter-this is take in place of the Zantac or ranitidine.  Lastly I started you on Myrbetriq for urinary incontinence.  Please let me know if you have any questions or concerns about the medicine.  I gave you information on it below..    Mirabegron extended-release tablets What is this medicine? MIRABEGRON (MIR a BEG ron) is used to treat overactive bladder. This medicine reduces the amount of bathroom visits. It may also help to control wetting accidents. This medicine may be used for other purposes; ask your health care provider or pharmacist if you have questions. COMMON BRAND NAME(S): Myrbetriq What should I tell my health care provider before I take this medicine? They need to know if you have any of these conditions: -difficulty passing urine -high blood pressure -kidney disease -liver disease -an unusual or allergic reaction to mirabegron, other medicines, foods, dyes, or preservatives -pregnant or trying to get pregnant -breast-feeding How should I use this medicine? Take this medicine by mouth with a glass of water. Follow the directions on the prescription label. Do not cut, crush or chew this medicine. You can take it with or without food. If it upsets your stomach, take it with food. Take your medicine at regular intervals. Do not take it more often than directed. Do not stop taking except on your doctor's advice. Talk to your pediatrician regarding the use of this medicine in children. Special care may be needed. Overdosage: If you think you have taken too much of this medicine contact  a poison control center or emergency room at once. NOTE: This medicine is only for you. Do not share this medicine with others. What if I miss a dose? If you miss a dose, take it as soon as you can. If it is almost time for your next dose, take only that dose. Do not take double or extra doses. What may interact with this medicine? -certain medicines for bladder problems like fesoterodine, oxybutynin, solifenacin, tolterodine -desipramine -digoxin -flecainide -ketoconazole -MAOIs like Carbex, Eldepryl, Marplan, Nardil, and Parnate -metoprolol -propafenone -thioridazine -warfarin This list may not describe all possible interactions. Give your health care provider a list of all the medicines, herbs, non-prescription drugs, or dietary supplements you use. Also tell them if you smoke, drink alcohol, or use illegal drugs. Some items may interact with your medicine. What should I watch for while using this medicine? It may take 8 weeks to notice the full benefit from this medicine. You may need to limit your intake tea, coffee, caffeinated sodas, and alcohol. These drinks may make your symptoms worse. Visit your doctor or health care professional for regular checks on your progress. Check your blood pressure as directed. Ask your doctor or health care professional what your blood pressure should be and when you should contact him or her. What side effects may I notice from receiving this medicine? Side effects that you should report to your doctor or health care professional as soon as possible: -allergic reactions like skin rash, itching or hives, swelling of the face, lips, or tongue -chest pain or palpitations -severe or sudden headache -high blood pressure -  fast, irregular heartbeat -redness, blistering, peeling or loosening of the skin, including inside the mouth -signs of infection like fever or chills; cough; sore throat; pain or difficulty passing urine -trouble passing urine or change  in the amount of urine Side effects that usually do not require medical attention (report to your doctor or health care professional if they continue or are bothersome): -constipation -diarrhea -dizziness -dry eyes -joint pain -mild headache -nausea -runny nose This list may not describe all possible side effects. Call your doctor for medical advice about side effects. You may report side effects to FDA at 1-800-FDA-1088. Where should I keep my medicine? Keep out of the reach of children. Store at room temperature between 15 and 30 degrees C (59 and 86 degrees F). Throw away any unused medicine after the expiration date. NOTE: This sheet is a summary. It may not cover all possible information. If you have questions about this medicine, talk to your doctor, pharmacist, or health care provider.  2018 Elsevier/Gold Standard (2016-01-15 12:14:30)

## 2018-11-08 ENCOUNTER — Telehealth: Payer: Self-pay | Admitting: Family Medicine

## 2018-11-08 NOTE — Telephone Encounter (Signed)
Patient called states declined Urinary Rx at drug store (due to its very expensive) ---Patient is asking for a different one (less costly) if possible.  ---Forwarding message to medical assistant to call patient if any questions. --glh

## 2018-11-08 NOTE — Telephone Encounter (Signed)
Patient called states declined Urinary Rx at drug store (due to its very expensive) ---Patient is asking for a different one (less costly) if possible.  ---Forwarding message to medical assistant to call patient if any questions.  --glh

## 2018-11-09 NOTE — Telephone Encounter (Signed)
I recommend patient ask her insurance which urinary incontinence medicine they do cover.  Because, this is a very common one that I often prescribe to most patients without a problem so, please have her check with her insurance and then let us know which medicines are covered

## 2018-11-09 NOTE — Telephone Encounter (Signed)
Patient was seen on 11/07/18 and RX was written for the patient for Myrbetriq.  Patient states that this medication is too expensive and is requesting a change if possible.  Please review and advise. MPulliam, CMA/RT(R)

## 2018-11-10 NOTE — Telephone Encounter (Signed)
Called patient left message for patient to call the office. MPulliam, CMA/RT(R)  

## 2018-11-14 NOTE — Telephone Encounter (Signed)
Called and left message for patient to call the office. MPulliam, CMA/RT(R)  

## 2018-11-16 DIAGNOSIS — E119 Type 2 diabetes mellitus without complications: Secondary | ICD-10-CM | POA: Diagnosis not present

## 2018-11-16 DIAGNOSIS — H52223 Regular astigmatism, bilateral: Secondary | ICD-10-CM | POA: Diagnosis not present

## 2018-11-16 LAB — HM DIABETES EYE EXAM

## 2018-12-25 ENCOUNTER — Other Ambulatory Visit: Payer: Self-pay | Admitting: Family Medicine

## 2018-12-25 DIAGNOSIS — K582 Mixed irritable bowel syndrome: Secondary | ICD-10-CM

## 2019-01-11 ENCOUNTER — Ambulatory Visit: Payer: Medicare Other | Admitting: Family Medicine

## 2019-01-16 ENCOUNTER — Ambulatory Visit (INDEPENDENT_AMBULATORY_CARE_PROVIDER_SITE_OTHER): Payer: Medicare Other | Admitting: Family Medicine

## 2019-01-16 ENCOUNTER — Encounter: Payer: Self-pay | Admitting: Family Medicine

## 2019-01-16 VITALS — BP 140/74 | HR 57 | Temp 98.0°F | Ht 65.0 in | Wt 164.0 lb

## 2019-01-16 DIAGNOSIS — E1122 Type 2 diabetes mellitus with diabetic chronic kidney disease: Secondary | ICD-10-CM

## 2019-01-16 DIAGNOSIS — K582 Mixed irritable bowel syndrome: Secondary | ICD-10-CM | POA: Diagnosis not present

## 2019-01-16 DIAGNOSIS — R32 Unspecified urinary incontinence: Secondary | ICD-10-CM

## 2019-01-16 DIAGNOSIS — N183 Chronic kidney disease, stage 3 (moderate): Secondary | ICD-10-CM | POA: Diagnosis not present

## 2019-01-16 DIAGNOSIS — E1159 Type 2 diabetes mellitus with other circulatory complications: Secondary | ICD-10-CM

## 2019-01-16 DIAGNOSIS — K219 Gastro-esophageal reflux disease without esophagitis: Secondary | ICD-10-CM

## 2019-01-16 DIAGNOSIS — I10 Essential (primary) hypertension: Secondary | ICD-10-CM

## 2019-01-16 DIAGNOSIS — I152 Hypertension secondary to endocrine disorders: Secondary | ICD-10-CM

## 2019-01-16 LAB — POCT GLYCOSYLATED HEMOGLOBIN (HGB A1C): Hemoglobin A1C: 6.7 % — AB (ref 4.0–5.6)

## 2019-01-16 MED ORDER — TOLTERODINE TARTRATE ER 2 MG PO CP24: 2.0000 mg | ORAL_CAPSULE | Freq: Every day | ORAL | 0 refills | Status: DC

## 2019-01-16 MED ORDER — AMLODIPINE BESYLATE 5 MG PO TABS: 5.0000 mg | ORAL_TABLET | Freq: Every day | ORAL | 1 refills | Status: DC

## 2019-01-16 MED ORDER — METFORMIN HCL ER 500 MG PO TB24: 500.0000 mg | ORAL_TABLET | Freq: Every day | ORAL | 1 refills | Status: DC

## 2019-01-16 NOTE — Progress Notes (Signed)
Impression and Recommendations:    1. Type 2 diabetes mellitus with stage 3 chronic kidney disease, unspecified whether long term insulin use ([name])   2. Urinary incontinence in female   3. Hypertension associated with diabetes ([name])   4. Irritable bowel syndrome with both constipation and diarrhea   5. Gastroesophageal reflux disease, esophagitis presence not specified      Type 2 diabetes mellitus with stage 3 chronic kidney disease, unspecified whether long term insulin use ([name]) - Plan: POCT glycosylated hemoglobin (Hb A1C), metFORMIN (GLUCOPHAGE-XR) 500 MG 24 hr tablet  Urinary incontinence in female - Plan: tolterodine (DETROL LA) 2 MG 24 hr capsule  Hypertension associated with diabetes ([name]) - Plan: amLODipine (NORVASC) 5 MG tablet  Irritable bowel syndrome with both constipation and diarrhea  Gastroesophageal reflux disease, esophagitis presence not specified  DM: -A1c at 6.7 today, 01/16/2019  -Blood sugar levels at 118, 132, 138, and 145.  -Patient tolerating Metformin well and advised the patient to continue on this.   Urinary incontinence:  -She is still have urinary incontinence, she didn't pick up Mybetriq due to cost, will discontinue this.  -Discussed that I will prescribe Detrol LA to aid with urinary incontinence.  -Patient to follow up if symptoms persist.   HTN:  -Home BP readings at 105/49, 105/51, 104/51, 97/43, 92/47.  -Will discontinue atenolol due to hypotension and bradycardia.  -Advised the patient to bring her blood pressure cuff into the office to have her cuff calibrated.  -Follow up in 6 weeks for BP check.   Mood:  -Stable, continues on Zoloft with good tolerance.  -Advised to continue on Zoloft.   GERD:  -Patient on Pepcid at this time and notes that she intermittently has relief depending on what she eats.  -She would like to continue with this medication as it helps to alleviate her symptoms.   Bowel Incontinence:  -Pt has had two  episodes of bowel incontinence since starting Zoloft.  -Discussed with the patient to identify if there are certain foods that the patient may consume that will cause her to have bowel incontinence.  -If stool continues to be loose, advised the patient to add fiber to her diet.    Will refill all medications today.   Follow up in 6 weeks for BP check.    Education and routine counseling performed. Handouts provided.   Orders Placed This Encounter  Procedures  . POCT glycosylated hemoglobin (Hb A1C)    Medications Discontinued During This Encounter  Medication Reason  . mirabegron ER (MYRBETRIQ) 25 MG TB24 tablet Cost of medication  . atenolol (TENORMIN) 100 MG tablet   . metFORMIN (GLUCOPHAGE-XR) 500 MG 24 hr tablet Reorder  . amLODipine (NORVASC) 5 MG tablet Reorder      Meds ordered this encounter  Medications  . metFORMIN (GLUCOPHAGE-XR) 500 MG 24 hr tablet    Sig: Take 1 tablet (500 mg total) by mouth daily.    Dispense:  90 tablet    Refill:  1  . amLODipine (NORVASC) 5 MG tablet    Sig: Take 1 tablet (5 mg total) by mouth daily.    Dispense:  90 tablet    Refill:  1  . tolterodine (DETROL LA) 2 MG 24 hr capsule    Sig: Take 1 capsule (2 mg total) by mouth daily.    Dispense:  90 capsule    Refill:  0    The patient was counseled, risk factors were discussed, anticipatory guidance given.  Gross side effects, risk and benefits, and alternatives of medications discussed with patient.  Patient is aware that all medications have potential side effects and we are unable to predict every side effect or drug-drug interaction that may occur.  Expresses verbal understanding and consents to current therapy plan and treatment regimen.   Return for DC atenolol, start Detrol LA; f/up 6 wks - bring in BP log.   Please see AVS handed out to patient at the end of our visit for further patient instructions/ counseling done pertaining to today's office visit.    Note:  This  document was prepared using Dragon voice recognition software and may include unintentional dictation errors.    This document serves as a record of services personally performed by Mellody Dance, DO. It was created on her behalf by Steva Colder, a trained medical scribe. The creation of this record is based on the scribe's personal observations and the provider's statements to them.   I have reviewed the above medical documentation for accuracy and completeness and I concur.  Mellody Dance, DO 01/21/2019 5:53 PM       Subjective:    Chief Complaint  Patient presents with  . Follow-up     Jill Daugherty is a 73 y.o. female who presents to [place] [facility] today for Diabetes Management.    Urinary Incontinence:  She wasn't taking Mybetriq due to the high cost of the medication. She notes one episode of urinary incontinence.   Bowel Incontinence:  She recently had an episode of bowel incontinence, and this was her second only episode of incontinence ever. She noted that this symptoms started when she began taking Zoloft.   Mood:  She notes that the Zoloft has helped improve her mood and make her more happy.   HTN:  Her at home blood pressure readings were at 105/49, 105/51, 104/51, 97/43, 92/47. She just bought this blood pressure cuff from [employer].    DM HPI: -  She has been working on diet and exercise for diabetes. She recently followed up with her optometrist and was informed that there wasn't any diabetic issues occurring to the eyes.   Pt is currently maintained on the following medications for diabetes:   see med list today Medication compliance - She is compliant on Metformin.    Home glucose readings range 118, 132, 138, and 145.   Denies polyuria/polydipsia. Denies hypo/ hyperglycemia symptoms - She denies new onset of: chest pain, exercise intolerance, shortness of breath, dizziness, visual changes, headache, lower extremity  swelling or claudication.   Last diabetic eye exam was  Lab Results  Component Value Date   HMDIABEYEEXA No Retinopathy 11/16/2018    Foot exam- UTD  Last A1C in the office was:  Lab Results  Component Value Date   HGBA1C 6.7 (A) 01/16/2019   HGBA1C 6.6 (A) 09/12/2018   HGBA1C 6.3 (H) 05/09/2018    Lab Results  Component Value Date   MICROALBUR 10 02/07/2018   LDLCALC 57 05/09/2018   CREATININE 0.78 05/09/2018      Last 3 blood pressure readings in our office are as follows: BP Readings from Last 3 Encounters:  01/16/19 140/74  11/07/18 117/79  10/05/18 124/73    BMI Readings from Last 3 Encounters:  01/16/19 27.29 kg/m  11/07/18 27.27 kg/m  09/12/18 26.99 kg/m     No problems updated.    Patient Care Team    Relationship Specialty Notifications Start End  Mellody Dance,  DO PCP - General Family Medicine  12/09/17   Druscilla Brownie, MD Consulting Physician Dermatology  01/11/18   Webb Laws, [place] Referring Physician Optometry  01/11/18   Celedonio Savage, PA-C Consulting Physician Dermatology  01/11/18    Comment: derm      Patient Active Problem List   Diagnosis Date Noted  . Type 2 diabetes mellitus with diabetic chronic kidney disease ([place]) 05/11/2018    Priority: High  . Hypertension associated with diabetes ([place]) 01/11/2018    Priority: High  . Mixed diabetic hyperlipidemia associated with type 2 diabetes mellitus ([place]) 01/11/2018    Priority: High  . DM2 (diabetes mellitus, type 2) ([place]) 06/23/2015    Priority: High  . Gout 06/23/2015    Priority: Medium  . GERD (gastroesophageal reflux disease) 06/23/2015    Priority: Medium  . Hypothyroidism 06/21/2013    Priority: Medium  . Family history of early CAD- dad in early 60's 02/07/2018    Priority: Low  . Family history of cerebrovascular accident (CVA) in mother- in her 62's 02/07/2018    Priority: Low  . Family history of diabetes mellitus (DM)- PGM- in 40's 02/07/2018     Priority: Low  . Family history of Alzheimer's disease- Mom in her 53 02/07/2018    Priority: Low  . Environmental and seasonal allergies 01/11/2018    Priority: Low  . Vitamin D deficiency 01/11/2018    Priority: Low  . Urinary incontinence in female 11/07/2018  . Gastroesophageal reflux disease 11/07/2018  . Irritable bowel syndrome with both constipation and diarrhea 09/12/2018  . Tinea corporis- lower abd folds 09/12/2018  . Acute maxillary sinusitis 04/10/2018  . Acute otitis media 04/10/2018  . Cough in adult 04/10/2018  . Noncompliance with diet and medication regimen 03/20/2018  . OAB (overactive bladder) 06/23/2015  . HTN (hypertension) 06/21/2013  . Hyperlipidemia 06/21/2013     Past Medical History:  Diagnosis Date  . History of chicken pox   . History of frequent urinary tract infections   . Hyperlipidemia   . Hypertension   . Overactive bladder   . Thyroid disease      History reviewed. No pertinent surgical history.   Family History  Problem Relation Age of Onset  . Heart disease Father   . Diabetes Paternal Grandmother   . Cancer Paternal Grandmother   . Breast cancer Neg Hx      Social History   Substance and Sexual Activity  Drug Use No  ,  Social History   Substance and Sexual Activity  Alcohol Use No  . Alcohol/week: 0.0 standard drinks  ,  Social History   Tobacco Use  Smoking Status Never Smoker  Smokeless Tobacco Never Used  ,    Current Outpatient Medications on File Prior to Visit  Medication Sig Dispense Refill  . allopurinol (ZYLOPRIM) 100 MG tablet Take 1 tablet by mouth daily.    Marland Kitchen aspirin EC 81 MG tablet Take 81 mg by mouth daily.    Marland Kitchen atorvastatin (LIPITOR) 20 MG tablet Take 20 mg by mouth at bedtime.    . Calcium Carbonate (CALCIUM 600 PO) Take by mouth.    . Cholecalciferol (VITAMIN D3) 1000 units CAPS Take 2 capsules by mouth.    . famotidine (PEPCID) 20 MG tablet Take 1 tablet (20 mg total) by mouth 2 (two) times  daily. 180 tablet 1  . fluticasone (FLONASE) 50 MCG/ACT nasal spray Place 1 spray into both nostrils 2 (two) times daily for 14 days. 1 g  0  . levothyroxine (SYNTHROID, LEVOTHROID) 50 MCG tablet Take 1 tablet (50 mcg total) by mouth daily. 90 tablet 1  . loratadine (CLARITIN) 10 MG tablet Take 1 tablet (10 mg total) by mouth daily. 90 tablet 3  . Miconazole Nitrate 2 % POWD Apply to affected folds of skin twice daily only when red\ irritated 1 Bottle 1   No current facility-administered medications on file prior to visit.      No Known Allergies   Review of Systems:   General:  Denies fever, chills Optho/Auditory:   Denies visual changes, blurred vision Respiratory:   Denies SOB, cough, wheeze, DIB  Cardiovascular:   Denies chest pain, palpitations, painful respirations Gastrointestinal:   Denies nausea, vomiting, diarrhea.  Endocrine:     Denies new hot or cold intolerance Musculoskeletal:  Denies joint swelling, gait issues, or new unexplained myalgias/ arthralgias Skin:  Denies rash, suspicious lesions  Neurological:    Denies dizziness, unexplained weakness, numbness  Psychiatric/Behavioral:   Denies mood changes    Objective:     Blood pressure 140/74, pulse (!) 57, temperature 98 F (36.7 C), height 5\' 5"  (1.651 m), weight 164 lb (74.4 kg), SpO2 98 %.  Body mass index is 27.29 kg/m.  General: Well Developed, well nourished, and in no acute distress.  HEENT: Normocephalic, atraumatic, pupils equal round reactive to light, neck supple, No carotid bruits, no JVD Skin: Warm and dry, cap RF less 2 sec Cardiac: Regular rate and rhythm, S1, S2 WNL's, no murmurs rubs or gallops Respiratory: ECTA B/L, Not using accessory muscles, speaking in full sentences. NeuroM-Sk: Ambulates w/o assistance, moves ext * 4 w/o difficulty, sensation grossly intact.  Ext: scant edema b/l lower ext Psych: No HI/SI, judgement and insight good, Euthymic mood. Full Affect.

## 2019-01-16 NOTE — Patient Instructions (Signed)
Your goal blood pressure should be 140/90 or less on a regular basis.  Please check it and bring in your blood pressure log next office visit.  Thank you.  Remember we are taking you off your atenolol but you will continue on the Norvasc, losartan and hydrochlorothiazide.    Hypertension Hypertension, commonly called high blood pressure, is when the force of blood pumping through the arteries is too strong. The arteries are the blood vessels that carry blood from the heart throughout the body. Hypertension forces the heart to work harder to pump blood and may cause arteries to become narrow or stiff. Having untreated or uncontrolled hypertension can cause heart attacks, strokes, kidney disease, and other problems. A blood pressure reading consists of a higher number over a lower number. Ideally, your blood pressure should be below 120/80. The first ("top") number is called the systolic pressure. It is a measure of the pressure in your arteries as your heart beats. The second ("bottom") number is called the diastolic pressure. It is a measure of the pressure in your arteries as the heart relaxes. What are the causes? The cause of this condition is not known. What increases the risk? Some risk factors for high blood pressure are under your control. Others are not. Factors you can change  Smoking.  Having type 2 diabetes mellitus, high cholesterol, or both.  Not getting enough exercise or physical activity.  Being overweight.  Having too much fat, sugar, calories, or salt (sodium) in your diet.  Drinking too much alcohol. Factors that are difficult or impossible to change  Having chronic kidney disease.  Having a family history of high blood pressure.  Age. Risk increases with age.  Race. You may be at higher risk if you are African-American.  Gender. Men are at higher risk than women before age 53. After age 61, women are at higher risk than men.  Having obstructive sleep  apnea.  Stress. What are the signs or symptoms? Extremely high blood pressure (hypertensive crisis) may cause:  Headache.  Anxiety.  Shortness of breath.  Nosebleed.  Nausea and vomiting.  Severe chest pain.  Jerky movements you cannot control (seizures).  How is this diagnosed? This condition is diagnosed by measuring your blood pressure while you are seated, with your arm resting on a surface. The cuff of the blood pressure monitor will be placed directly against the skin of your upper arm at the level of your heart. It should be measured at least twice using the same arm. Certain conditions can cause a difference in blood pressure between your right and left arms. Certain factors can cause blood pressure readings to be lower or higher than normal (elevated) for a short period of time:  When your blood pressure is higher when you are in a health care provider's office than when you are at home, this is called white coat hypertension. Most people with this condition do not need medicines.  When your blood pressure is higher at home than when you are in a health care provider's office, this is called masked hypertension. Most people with this condition may need medicines to control blood pressure.  If you have a high blood pressure reading during one visit or you have normal blood pressure with other risk factors:  You may be asked to return on a different day to have your blood pressure checked again.  You may be asked to monitor your blood pressure at home for 1 week or longer.  If  you are diagnosed with hypertension, you may have other blood or imaging tests to help your health care provider understand your overall risk for other conditions. How is this treated? This condition is treated by making healthy lifestyle changes, such as eating healthy foods, exercising more, and reducing your alcohol intake. Your health care provider may prescribe medicine if lifestyle changes are  not enough to get your blood pressure under control, and if:  Your systolic blood pressure is above 130.  Your diastolic blood pressure is above 80.  Your personal target blood pressure may vary depending on your medical conditions, your age, and other factors. Follow these instructions at home: Eating and drinking  Eat a diet that is high in fiber and potassium, and low in sodium, added sugar, and fat. An example eating plan is called the DASH (Dietary Approaches to Stop Hypertension) diet. To eat this way: ? Eat plenty of fresh fruits and vegetables. Try to fill half of your plate at each meal with fruits and vegetables. ? Eat whole grains, such as whole wheat pasta, brown rice, or whole grain bread. Fill about one quarter of your plate with whole grains. ? Eat or drink low-fat dairy products, such as skim milk or low-fat yogurt. ? Avoid fatty cuts of meat, processed or cured meats, and poultry with skin. Fill about one quarter of your plate with lean proteins, such as fish, chicken without skin, beans, eggs, and tofu. ? Avoid premade and processed foods. These tend to be higher in sodium, added sugar, and fat.  Reduce your daily sodium intake. Most people with hypertension should eat less than 1,500 mg of sodium a day.  Limit alcohol intake to no more than 1 drink a day for nonpregnant women and 2 drinks a day for men. One drink equals 12 oz of beer, 5 oz of wine, or 1 oz of hard liquor. Lifestyle  Work with your health care provider to maintain a healthy body weight or to lose weight. Ask what an ideal weight is for you.  Get at least 30 minutes of exercise that causes your heart to beat faster (aerobic exercise) most days of the week. Activities may include walking, swimming, or biking.  Include exercise to strengthen your muscles (resistance exercise), such as pilates or lifting weights, as part of your weekly exercise routine. Try to do these types of exercises for 30 minutes at  least 3 days a week.  Do not use any products that contain nicotine or tobacco, such as cigarettes and e-cigarettes. If you need help quitting, ask your health care provider.  Monitor your blood pressure at home as told by your health care provider.  Keep all follow-up visits as told by your health care provider. This is important. Medicines  Take over-the-counter and prescription medicines only as told by your health care provider. Follow directions carefully. Blood pressure medicines must be taken as prescribed.  Do not skip doses of blood pressure medicine. Doing this puts you at risk for problems and can make the medicine less effective.  Ask your health care provider about side effects or reactions to medicines that you should watch for. Contact a health care provider if:  You think you are having a reaction to a medicine you are taking.  You have headaches that keep coming back (recurring).  You feel dizzy.  You have swelling in your ankles.  You have trouble with your vision. Get help right away if:  You develop a severe headache or  confusion.  You have unusual weakness or numbness.  You feel faint.  You have severe pain in your chest or abdomen.  You vomit repeatedly.  You have trouble breathing. Summary  Hypertension is when the force of blood pumping through your arteries is too strong. If this condition is not controlled, it may put you at risk for serious complications.  Your personal target blood pressure may vary depending on your medical conditions, your age, and other factors. For most people, a normal blood pressure is less than 120/80.  Hypertension is treated with lifestyle changes, medicines, or a combination of both. Lifestyle changes include weight loss, eating a healthy, low-sodium diet, exercising more, and limiting alcohol. This information is not intended to replace advice given to you by your health care provider. Make sure you discuss any  questions you have with your health care provider. Document Released: 12/13/2005 Document Revised: 11/10/2016 Document Reviewed: 11/10/2016 Elsevier Interactive Patient Education  2018 [employer].    How to Take Your Blood Pressure   Blood pressure is a measurement of how strongly your blood is pressing against the walls of your arteries. Arteries are blood vessels that carry blood from your heart throughout your body. Your health care provider takes your blood pressure at each office visit. You can also take your own blood pressure at home with a blood pressure machine. You may need to take your own blood pressure:  To confirm a diagnosis of high blood pressure (hypertension).  To monitor your blood pressure over time.  To make sure your blood pressure medicine is working.  Supplies needed: To take your blood pressure, you will need a blood pressure machine. You can buy a blood pressure machine, or blood pressure monitor, at most drugstores or online. There are several types of home blood pressure monitors. When choosing one, consider the following:  Choose a monitor that has an arm cuff.  Choose a monitor that wraps snugly around your upper arm. You should be able to fit only one finger between your arm and the cuff.  Do not choose a monitor that measures your blood pressure from your wrist or finger.  Your health care provider can suggest a reliable monitor that will meet your needs. How to prepare To get the most accurate reading, avoid the following for 30 minutes before you check your blood pressure:  Drinking caffeine.  Drinking alcohol.  Eating.  Smoking.  Exercising.  Five minutes before you check your blood pressure:  Empty your bladder.  Sit quietly without talking in a dining chair, rather than in a soft couch or armchair.  How to take your blood pressure To check your blood pressure, follow the instructions in the manual that came with your blood  pressure monitor. If you have a digital blood pressure monitor, the instructions may be as follows: 1. Sit up straight. 2. Place your feet on the floor. Do not cross your ankles or legs. 3. Rest your left arm at the level of your heart on a table or desk or on the arm of a chair. 4. Pull up your shirt sleeve. 5. Wrap the blood pressure cuff around the upper part of your left arm, 1 inch (2.5 cm) above your elbow. It is best to wrap the cuff around bare skin. 6. Fit the cuff snugly around your arm. You should be able to place only one finger between the cuff and your arm. 7. Position the cord inside the groove of your elbow. 8. Press  the power button. 9. Sit quietly while the cuff inflates and deflates. 10. Read the digital reading on the monitor screen and write it down (record it). 11. Wait 2-3 minutes, then repeat the steps, starting at step 1.  What does my blood pressure reading mean? A blood pressure reading consists of a higher number over a lower number. Ideally, your blood pressure should be below 120/80. The first ("top") number is called the systolic pressure. It is a measure of the pressure in your arteries as your heart beats. The second ("bottom") number is called the diastolic pressure. It is a measure of the pressure in your arteries as the heart relaxes. Blood pressure is classified into four stages. The following are the stages for adults who do not have a short-term serious illness or a chronic condition. Systolic pressure and diastolic pressure are measured in a unit called mm Hg. Normal  Systolic pressure: below 053.  Diastolic pressure: below 80. Elevated  Systolic pressure: 976-734.  Diastolic pressure: below 80. Hypertension stage 1  Systolic pressure: 193-790.  Diastolic pressure: 24-09. Hypertension stage 2  Systolic pressure: 735 or above.  Diastolic pressure: 90 or above. You can have prehypertension or hypertension even if only the systolic or only the  diastolic number in your reading is higher than normal. Follow these instructions at home:  Check your blood pressure as often as recommended by your health care provider.  Take your monitor to the next appointment with your health care provider to make sure: ? That you are using it correctly. ? That it provides accurate readings.  Be sure you understand what your goal blood pressure numbers are.  Tell your health care provider if you are having any side effects from blood pressure medicine. Contact a health care provider if:  Your blood pressure is consistently high. Get help right away if:  Your systolic blood pressure is higher than 180.  Your diastolic blood pressure is higher than 110. This information is not intended to replace advice given to you by your health care provider. Make sure you discuss any questions you have with your health care provider. Document Released: 05/21/2016 Document Revised: 08/03/2016 Document Reviewed: 05/21/2016 Elsevier Interactive Patient Education  [employer].

## 2019-01-17 ENCOUNTER — Other Ambulatory Visit: Payer: Self-pay | Admitting: Family Medicine

## 2019-01-17 DIAGNOSIS — K582 Mixed irritable bowel syndrome: Secondary | ICD-10-CM

## 2019-01-24 ENCOUNTER — Telehealth: Payer: Self-pay

## 2019-01-24 NOTE — Telephone Encounter (Signed)
[employer] sent over request to change the tolterodine 2mg  capsules due to it being non formulary.  Their perferred medications are oxybutynin er, mybetriq, and solifenactin.  LOV 01/16/2019.  Please review and advise if change is approved. MPulliam, CMA/RT(R)

## 2019-01-25 MED ORDER — OXYBUTYNIN CHLORIDE ER 5 MG PO TB24: 5.0000 mg | ORAL_TABLET | Freq: Every day | ORAL | 0 refills | Status: DC

## 2019-01-25 NOTE — Telephone Encounter (Signed)
Sent medication in per note.  Called and notified patient. MPulliam, CMA/RT(R)

## 2019-01-25 NOTE — Telephone Encounter (Signed)
Ditropan XL 5mg   1po qd  Disp:  90 no rf

## 2019-02-07 ENCOUNTER — Other Ambulatory Visit: Payer: Self-pay

## 2019-02-07 ENCOUNTER — Telehealth: Payer: Self-pay | Admitting: Family Medicine

## 2019-02-07 NOTE — Telephone Encounter (Signed)
Patient requesting a refill on allopurinol, medication was last filled by a pervious doctor.  LOV 01/16/2019.   Patient also requesting a RX for Protonix 40mg  for reflux, patient has been taking some of her husbands medication and states that it helps.  Please review and advised. MPulliam, CMA/RT(R)

## 2019-02-07 NOTE — Telephone Encounter (Signed)
Patient's husband called to request refill on :  allopurinol (ZYLOPRIM) 100 MG tablet [[id number]]   Order Details  Dose: 1 tablet Route: Oral Frequency: Daily  Dispense Quantity: -- Refills: -- Fills remaining: --        Sig: Take 1 tablet by mouth daily.     &   --pt also request to be started on a Protonix (says husband takes Pantoprazole ) & she wants that.  --forwarding request to medical assistant for review w/provider --  Pt uses :   [facility] [address] ([address] [place] [address] [phone] (Phone) [phone] (Fax)   --glh

## 2019-02-07 NOTE — Telephone Encounter (Signed)
Sent message to Dr. Raliegh Scarlet to review. MPulliam, CMA/RT(R)

## 2019-02-07 NOTE — Telephone Encounter (Signed)
Ok to rf Allopurinol as is  Please make sure pt is not taking any other PPI's and not taking NSAIDS.  Protonix 20mg  1 po qd 90, no rf  - will need to know how this med works, b-4 [place].   please inform pt higher dose is for conditions other than reflux, thus 40mg  would not be appropriate for her unless directed by GI doc

## 2019-02-08 MED ORDER — ALLOPURINOL 100 MG PO TABS: 100.0000 mg | ORAL_TABLET | Freq: Every day | ORAL | 1 refills | Status: DC

## 2019-02-08 NOTE — Telephone Encounter (Signed)
Called patient left message to call the office. MPulliam, CMA/RT(R)  

## 2019-02-20 DIAGNOSIS — L821 Other seborrheic keratosis: Secondary | ICD-10-CM | POA: Diagnosis not present

## 2019-02-20 DIAGNOSIS — D0439 Carcinoma in situ of skin of other parts of face: Secondary | ICD-10-CM | POA: Diagnosis not present

## 2019-02-20 DIAGNOSIS — L820 Inflamed seborrheic keratosis: Secondary | ICD-10-CM | POA: Diagnosis not present

## 2019-02-20 DIAGNOSIS — Z23 Encounter for immunization: Secondary | ICD-10-CM | POA: Diagnosis not present

## 2019-02-28 ENCOUNTER — Encounter: Payer: Self-pay | Admitting: Family Medicine

## 2019-02-28 ENCOUNTER — Ambulatory Visit (INDEPENDENT_AMBULATORY_CARE_PROVIDER_SITE_OTHER): Payer: Medicare Other | Admitting: Family Medicine

## 2019-02-28 VITALS — BP 140/82 | HR 80 | Temp 98.3°F | Ht 65.0 in | Wt 167.4 lb

## 2019-02-28 DIAGNOSIS — F4323 Adjustment disorder with mixed anxiety and depressed mood: Secondary | ICD-10-CM | POA: Insufficient documentation

## 2019-02-28 DIAGNOSIS — E1159 Type 2 diabetes mellitus with other circulatory complications: Secondary | ICD-10-CM | POA: Diagnosis not present

## 2019-02-28 DIAGNOSIS — E1122 Type 2 diabetes mellitus with diabetic chronic kidney disease: Secondary | ICD-10-CM

## 2019-02-28 DIAGNOSIS — N183 Chronic kidney disease, stage 3 (moderate): Secondary | ICD-10-CM | POA: Diagnosis not present

## 2019-02-28 DIAGNOSIS — I10 Essential (primary) hypertension: Secondary | ICD-10-CM

## 2019-02-28 DIAGNOSIS — E039 Hypothyroidism, unspecified: Secondary | ICD-10-CM

## 2019-02-28 DIAGNOSIS — K219 Gastro-esophageal reflux disease without esophagitis: Secondary | ICD-10-CM

## 2019-02-28 DIAGNOSIS — I152 Hypertension secondary to endocrine disorders: Secondary | ICD-10-CM

## 2019-02-28 DIAGNOSIS — R32 Unspecified urinary incontinence: Secondary | ICD-10-CM | POA: Diagnosis not present

## 2019-02-28 DIAGNOSIS — K582 Mixed irritable bowel syndrome: Secondary | ICD-10-CM

## 2019-02-28 LAB — POCT UA - MICROALBUMIN
Albumin/Creatinine Ratio, Urine, POC: <30
CREATININE, POC: 200 mg/dL
Microalbumin Ur, POC: 10 mg/L

## 2019-02-28 MED ORDER — OMEPRAZOLE 20 MG PO CPDR: 20.0000 mg | DELAYED_RELEASE_CAPSULE | Freq: Every day | ORAL | 0 refills | Status: DC

## 2019-02-28 MED ORDER — LEVOTHYROXINE SODIUM 50 MCG PO TABS: 50.0000 ug | ORAL_TABLET | Freq: Every day | ORAL | 1 refills | Status: DC

## 2019-02-28 MED ORDER — TOLTERODINE TARTRATE ER 2 MG PO CP24: 2.0000 mg | ORAL_CAPSULE | Freq: Every day | ORAL | 0 refills | Status: DC

## 2019-02-28 MED ORDER — SERTRALINE HCL 25 MG PO TABS: 25.0000 mg | ORAL_TABLET | Freq: Every day | ORAL | 1 refills | Status: DC

## 2019-02-28 NOTE — Progress Notes (Signed)
Impression and Recommendations:    1. Urinary incontinence in female   2. Hypertension associated with diabetes ([place])   3. Gastroesophageal reflux disease, esophagitis presence not specified   4. Hypothyroidism, unspecified type   5. Irritable bowel syndrome with both constipation and diarrhea   6. Adjustment disorder with mixed anxiety and depressed mood   7. Type 2 diabetes mellitus with stage 3 chronic kidney disease, unspecified whether long term insulin use ([place])      1. Diabetes Mellitus - Blood sugars stable at home.  A1c at goal  - Continue treatment plan as recommended.  See med list. - Patient tolerating meds well without complication.  Denies S-E.  - Discussed prudent dietary and lifestyle modifications as first line.  Importance of low carb/ketogenic diet discussed with patient in addition to regular exercise.   - Encouraged patient to continue checking FBS and 2 hours after the biggest meal of the day.  Keep log and bring in next OV for my review.   Also, if you ever feel poorly, please check your blood pressure and blood sugar, as one or the other could be the cause of your symptoms.  - Being a diabetic, you need yearly eye and foot exams. Make appt.for diabetic eye exam.    2. Hypertension Associated with Diabetes Mellitus - Last OV, per patient, reported home blood pressure in 100/40's with bradycardia.  - Discontinued Atenolol last visit. - Blood pressure controlled and resolved  And s-e / WNL after discontinuing Atenolol.  - Continue treatment plan as prescribed.  - Patient tolerating meds well without complication.  Denies S-E    3. GERD  - Discussed that omeprazole can contribute to memory issues, and has potential side effect - Prescription omeprazole provided to take PRN, only if her GERD is very bad.  - If patient can control her GERD using Pepcid, encouraged patient to use this instead of omeprazole.  - In addition, STRONGLY encouraged  patient to avoid trigger foods, and engage in lifestyle changes to help control her symptoms.    4. Urinary Incontinence - Patient started Detrol LA last visit. - Per patient, symptoms well-managed on this treatment. - Continue treatment plan as prescribed.  - Will continue to monitor.    5. Hypothyroidism - Levothyroxine refilled today. - Continue treatment plan as prescribed.  See med list. - Will continue to monitor.    6. Zoloft Use - IBS & Mood - Per patient, takes one tablet daily. - Continue treatment plan as prescribed.  See med list. - Will continue to monitor.     7. Health Maintenance - Discussed need for patient to continue to obtain lab work, management and screenings with all established specialists, such as dermatology.  Educated patient at length about the critical importance of keeping health maintenance up to date.  - Encouraged patient to look up ColoGuard if she insists on declining colonoscopy.  - Participated in lengthy conversation and all questions were answered.    8. Prudent Lifestyle Habits - Advised patient to continue working toward exercising to improve overall mental, physical, and emotional health.    - Healthy dietary habits encouraged, including low-carb, and high amounts of lean protein in diet.   - Patient should also consume adequate amounts of water.    Education and routine counseling performed. Handouts provided.    Orders Placed This Encounter  Procedures  . POCT UA - Microalbumin    Meds ordered this encounter  Medications  . levothyroxine (  SYNTHROID, LEVOTHROID) 50 MCG tablet    Sig: Take 1 tablet (50 mcg total) by mouth daily.    Dispense:  90 tablet    Refill:  1  . sertraline (ZOLOFT) 25 MG tablet    Sig: Take 1 tablet (25 mg total) by mouth daily.    Dispense:  90 tablet    Refill:  1  . tolterodine (DETROL LA) 2 MG 24 hr capsule    Sig: Take 1 capsule (2 mg total) by mouth daily.    Dispense:  90 capsule     Refill:  0  . omeprazole (PRILOSEC) 20 MG capsule    Sig: Take 1 capsule (20 mg total) by mouth daily. As needed for Reflux/Heartburn    Dispense:  90 capsule    Refill:  0    Medications Discontinued During This Encounter  Medication Reason  . oxybutynin (DITROPAN XL) 5 MG 24 hr tablet   . levothyroxine (SYNTHROID, LEVOTHROID) 50 MCG tablet Reorder  . sertraline (ZOLOFT) 25 MG tablet Reorder  . tolterodine (DETROL LA) 2 MG 24 hr capsule Reorder     The patient was counseled, risk factors were discussed, anticipatory guidance given.  Gross side effects, risk and benefits, and alternatives of medications discussed with patient.  Patient is aware that all medications have potential side effects and we are unable to predict every side effect or drug-drug interaction that may occur.  Expresses verbal understanding and consents to current therapy plan and treatment regimen.  Return for f/up mid- May for medicare wellness exam and FBW.  Please see AVS handed out to patient at the end of our visit for further patient instructions/ counseling done pertaining to today's office visit.    Note:  This document was prepared using Dragon voice recognition software and may include unintentional dictation errors.  This document serves as a record of services personally performed by Mellody Dance, DO. It was created on her behalf by Toni Amend, a trained medical scribe. The creation of this record is based on the scribe's personal observations and the provider's statements to them.   I have reviewed the above medical documentation for accuracy and completeness and I concur.  Mellody Dance, DO 03/02/2019 2:27 PM        Subjective:    HPI: Jill Daugherty is a 73 y.o. female who presents to [facility] today for follow up of [place].    Feels she's been doing well.  Her blood pressure has been good since discontinuing atenolol.  Skin  Concerns Notes she recently had a mole removed, and has other things she needs to take off.  GERD Patient takes omeprazole in the morning and notes "it helps me a lot."  Urinary Incontinence Feels her urinary incontinence is doing well.  Her symptoms are now under good control.  She is taking the 2 mg dose.  "Everything's good.  I'm sleeping better at night.  I do get up at one time (to urinate), and I used to get up three times."  Denies accidents during the day.  Zoloft Use - IBS and Mood Notes she's sleeping better and feeling calmer, and not worrying as much.    Comments "I feel great, I feel good."  Patient takes Zoloft to manage IBS and mood.  HTN:  -  Her blood pressure has been controlled at home.  Pt as been checking it regularly.  Starting in January -  123/59 with pulse of 59  119/57 110/56 121/67  In February - 121/57 117/55 with pulse of 68-70 127/66 with pulse of 84  Last check, BP was 128/68, with pulse of 77.  She reports no symptoms.  - Patient reports good compliance with blood pressure medications  - Denies medication S-E   - Smoking Status noted   - She denies new onset of: chest pain, exercise intolerance, shortness of breath, dizziness, visual changes, headache, lower extremity swelling or claudication.   Last 3 blood pressure readings in our office are as follows: BP Readings from Last 3 Encounters:  02/28/19 140/82  01/16/19 140/74  11/07/18 117/79    Pulse Readings from Last 3 Encounters:  02/28/19 80  01/16/19 (!) 57  11/07/18 64    Filed Weights   02/28/19 1039  Weight: 167 lb 6.4 oz (75.9 kg)   DM HPI:  -  She has been working on diet and exercise for diabetes  Notes her blood sugars have been good and she knows when the go high.  Notes she had some cheese doodles last night and that is why her sugar was elevated this morning.  Says "I've been pretty good aside from last night."  Pt is currently maintained on the following  medications for diabetes: see med list today  Medication compliance - continues on treatment plan as prescribed.  Home glucose readings range - WNL.   Denies polyuria/polydipsia.  Denies hypo/ hyperglycemia symptoms  Last diabetic eye exam was  Lab Results  Component Value Date   HMDIABEYEEXA No Retinopathy 11/16/2018    Foot exam- UTD  Last A1C in the office was:  Lab Results  Component Value Date   HGBA1C 6.7 (A) 01/16/2019   HGBA1C 6.6 (A) 09/12/2018   HGBA1C 6.3 (H) 05/09/2018    Lab Results  Component Value Date   MICROALBUR 10 02/28/2019   LDLCALC 57 05/09/2018   CREATININE 0.78 05/09/2018    Wt Readings from Last 3 Encounters:  02/28/19 167 lb 6.4 oz (75.9 kg)  01/16/19 164 lb (74.4 kg)  11/07/18 163 lb 14.4 oz (74.3 kg)    BP Readings from Last 3 Encounters:  02/28/19 140/82  01/16/19 140/74  11/07/18 117/79        Patient Care Team    Relationship Specialty Notifications Start End  Mellody Dance, DO PCP - General Family Medicine  12/09/17   Druscilla Brownie, MD Consulting Physician Dermatology  01/11/18   Webb Laws, [place] Referring Physician Optometry  01/11/18   Celedonio Savage, PA-C Consulting Physician Dermatology  01/11/18    Comment: derm      Lab Results  Component Value Date   CREATININE 0.78 05/09/2018   BUN 16 05/09/2018   NA 143 05/09/2018   K 4.7 05/09/2018   CL 101 05/09/2018   CO2 26 05/09/2018    Lab Results  Component Value Date   CHOL 128 05/09/2018   CHOL 219 (H) 01/30/2018   CHOL 175 11/08/2017    Lab Results  Component Value Date   HDL 44 05/09/2018   HDL 47 01/30/2018   HDL 47 11/08/2017    Lab Results  Component Value Date   LDLCALC 57 05/09/2018   LDLCALC 136 (H) 01/30/2018   LDLCALC 91 11/08/2017    Lab Results  Component Value Date   TRIG 137 05/09/2018   TRIG 182 (H) 01/30/2018   TRIG 183 (A) 11/08/2017    Lab Results  Component Value Date   CHOLHDL 2.9 05/09/2018   CHOLHDL 4.7  (H)  01/30/2018   CHOLHDL 4 12/23/2015    Lab Results  Component Value Date   LDLDIRECT 94.0 12/23/2015   ===================================================================   Patient Active Problem List   Diagnosis Date Noted  . Type 2 diabetes mellitus with diabetic chronic kidney disease ([place]) 05/11/2018    Priority: High  . Hypertension associated with diabetes ([place]) 01/11/2018    Priority: High  . Mixed diabetic hyperlipidemia associated with type 2 diabetes mellitus ([place]) 01/11/2018    Priority: High  . DM2 (diabetes mellitus, type 2) ([place]) 06/23/2015    Priority: High  . Gout 06/23/2015    Priority: Medium  . GERD (gastroesophageal reflux disease) 06/23/2015    Priority: Medium  . Hypothyroidism 06/21/2013    Priority: Medium  . Family history of early CAD- dad in early 60's 02/07/2018    Priority: Low  . Family history of cerebrovascular accident (CVA) in mother- in her 79's 02/07/2018    Priority: Low  . Family history of diabetes mellitus (DM)- PGM- in 40's 02/07/2018    Priority: Low  . Family history of Alzheimer's disease- Mom in her 45 02/07/2018    Priority: Low  . Environmental and seasonal allergies 01/11/2018    Priority: Low  . Vitamin D deficiency 01/11/2018    Priority: Low  . Adjustment disorder with mixed anxiety and depressed mood 02/28/2019  . Urinary incontinence in female 11/07/2018  . Gastroesophageal reflux disease 11/07/2018  . Irritable bowel syndrome with both constipation and diarrhea 09/12/2018  . Tinea corporis- lower abd folds 09/12/2018  . Acute maxillary sinusitis 04/10/2018  . Acute otitis media 04/10/2018  . Cough in adult 04/10/2018  . Noncompliance with diet and medication regimen 03/20/2018  . OAB (overactive bladder) 06/23/2015  . HTN (hypertension) 06/21/2013  . Hyperlipidemia 06/21/2013     Past Medical History:  Diagnosis Date  . History of chicken pox   . History of frequent urinary tract infections   .  Hyperlipidemia   . Hypertension   . Overactive bladder   . Thyroid disease      History reviewed. No pertinent surgical history.   Family History  Problem Relation Age of Onset  . Heart disease Father   . Diabetes Paternal Grandmother   . Cancer Paternal Grandmother   . Breast cancer Neg Hx      Social History   Substance and Sexual Activity  Drug Use No  ,  Social History   Substance and Sexual Activity  Alcohol Use No  . Alcohol/week: 0.0 standard drinks  ,  Social History   Tobacco Use  Smoking Status Never Smoker  Smokeless Tobacco Never Used  ,    Current Outpatient Medications on File Prior to Visit  Medication Sig Dispense Refill  . allopurinol (ZYLOPRIM) 100 MG tablet Take 1 tablet (100 mg total) by mouth daily. 90 tablet 1  . amLODipine (NORVASC) 5 MG tablet Take 1 tablet (5 mg total) by mouth daily. 90 tablet 1  . aspirin EC 81 MG tablet Take 81 mg by mouth daily.    Marland Kitchen atorvastatin (LIPITOR) 20 MG tablet Take 20 mg by mouth at bedtime.    . Calcium Carbonate (CALCIUM 600 PO) Take by mouth.    . Cholecalciferol (VITAMIN D3) 1000 units CAPS Take 2 capsules by mouth.    . famotidine (PEPCID) 20 MG tablet Take 1 tablet (20 mg total) by mouth 2 (two) times daily. 180 tablet 1  . fluticasone (FLONASE) 50 MCG/ACT nasal spray Place 1 spray into both  nostrils 2 (two) times daily for 14 days. 1 g 0  . hydrochlorothiazide (HYDRODIURIL) 12.5 MG tablet TAKE 1 TABLET BY MOUTH ONCE DAILY 90 tablet 0  . loratadine (CLARITIN) 10 MG tablet Take 1 tablet (10 mg total) by mouth daily. 90 tablet 3  . losartan (COZAAR) 50 MG tablet TAKE 1 TABLET BY MOUTH ONCE DAILY 90 tablet 0  . metFORMIN (GLUCOPHAGE-XR) 500 MG 24 hr tablet Take 1 tablet (500 mg total) by mouth daily. 90 tablet 1  . Miconazole Nitrate 2 % POWD Apply to affected folds of skin twice daily only when red\ irritated 1 Bottle 1   No current facility-administered medications on file prior to visit.      No  Known Allergies   Review of Systems:   General:  Denies fever, chills Optho/Auditory:   Denies visual changes, blurred vision Respiratory:   Denies SOB, cough, wheeze, DIB  Cardiovascular:   Denies chest pain, palpitations, painful respirations Gastrointestinal:   Denies nausea, vomiting, diarrhea.  Endocrine:     Denies new hot or cold intolerance Musculoskeletal:  Denies joint swelling, gait issues, or new unexplained myalgias/ arthralgias Skin:  Denies rash, suspicious lesions  Neurological:    Denies dizziness, unexplained weakness, numbness  Psychiatric/Behavioral:   Denies mood changes  Objective:    Blood pressure 140/82, pulse 80, temperature 98.3 F (36.8 C), height 5\' 5"  (1.651 m), weight 167 lb 6.4 oz (75.9 kg), SpO2 98 %.  Body mass index is 27.86 kg/m.  General: Well Developed, well nourished, and in no acute distress.  HEENT: Normocephalic, atraumatic, pupils equal round reactive to light, neck supple, No carotid bruits, no JVD Skin: Warm and dry, cap RF less 2 sec Cardiac: Regular rate and rhythm, S1, S2 WNL's, no murmurs rubs or gallops Respiratory: ECTA B/L, Not using accessory muscles, speaking in full sentences. NeuroM-Sk: Ambulates w/o assistance, moves ext * 4 w/o difficulty, sensation grossly intact.  Ext: scant edema b/l lower ext Psych: No HI/SI, judgement and insight good, Euthymic mood. Full Affect.

## 2019-03-18 ENCOUNTER — Other Ambulatory Visit: Payer: Self-pay | Admitting: Family Medicine

## 2019-04-05 ENCOUNTER — Other Ambulatory Visit: Payer: Self-pay

## 2019-04-05 DIAGNOSIS — E1122 Type 2 diabetes mellitus with diabetic chronic kidney disease: Secondary | ICD-10-CM

## 2019-04-05 DIAGNOSIS — N183 Chronic kidney disease, stage 3 (moderate): Secondary | ICD-10-CM

## 2019-04-05 DIAGNOSIS — E559 Vitamin D deficiency, unspecified: Secondary | ICD-10-CM

## 2019-04-05 DIAGNOSIS — Z0000 Encounter for general adult medical examination without abnormal findings: Secondary | ICD-10-CM

## 2019-04-05 DIAGNOSIS — E785 Hyperlipidemia, unspecified: Secondary | ICD-10-CM

## 2019-04-05 DIAGNOSIS — I10 Essential (primary) hypertension: Secondary | ICD-10-CM

## 2019-04-05 DIAGNOSIS — R5383 Other fatigue: Secondary | ICD-10-CM

## 2019-04-05 DIAGNOSIS — E039 Hypothyroidism, unspecified: Secondary | ICD-10-CM

## 2019-04-28 ENCOUNTER — Other Ambulatory Visit: Payer: Self-pay | Admitting: Family Medicine

## 2019-04-28 DIAGNOSIS — N183 Chronic kidney disease, stage 3 (moderate): Principal | ICD-10-CM

## 2019-04-28 DIAGNOSIS — E1122 Type 2 diabetes mellitus with diabetic chronic kidney disease: Secondary | ICD-10-CM

## 2019-05-03 ENCOUNTER — Other Ambulatory Visit: Payer: Self-pay | Admitting: Family Medicine

## 2019-05-03 MED ORDER — ATORVASTATIN CALCIUM 20 MG PO TABS: 20.0000 mg | ORAL_TABLET | Freq: Every day | ORAL | 0 refills | Status: DC

## 2019-05-03 NOTE — Telephone Encounter (Signed)
Patient's husband called states she is just about out of medication : (see last Rx fill by previous provider, not Dr. Jenetta Downer) atorvastatin (LIPITOR) 20 MG tablet [[id number]]   Order Details  Dose: 20 mg Route: Oral Frequency: Daily at bedtime  Dispense Quantity: -- Refills: -- Fills remaining: --        Sig: Take 20 mg by mouth at bedtime.       Written Date: -- Expiration Date: -- Ordering Date: 06/21/13   Start Date: 04/07/13 End Date: --         Ordering Provider: -- DEA #:  -- NPI:  --   Authorizing Provider: [provider] DEA #:  -- NPI:  [id number]   Ordering User:  Legrand Como, CMA       ---- -----[place] request to medical assistant to send refill order to :   [facility] ([address]), [place] [address] [place] [address] [phone] (Phone) [phone] (Fax)   --glh

## 2019-05-03 NOTE — Telephone Encounter (Signed)
We have not prescribed this medication for the patient previously.  Please review and refill if appropriate.  T. Akaylah Lalley, CMA  

## 2019-05-15 ENCOUNTER — Ambulatory Visit: Payer: Medicare Other | Admitting: Family Medicine

## 2019-06-03 ENCOUNTER — Other Ambulatory Visit: Payer: Self-pay | Admitting: Family Medicine

## 2019-06-03 DIAGNOSIS — E1159 Type 2 diabetes mellitus with other circulatory complications: Secondary | ICD-10-CM

## 2019-06-03 DIAGNOSIS — I152 Hypertension secondary to endocrine disorders: Secondary | ICD-10-CM

## 2019-07-03 ENCOUNTER — Other Ambulatory Visit: Payer: Self-pay | Admitting: Family Medicine

## 2019-07-03 ENCOUNTER — Other Ambulatory Visit: Payer: Self-pay

## 2019-07-03 DIAGNOSIS — E1122 Type 2 diabetes mellitus with diabetic chronic kidney disease: Secondary | ICD-10-CM

## 2019-07-03 DIAGNOSIS — N183 Chronic kidney disease, stage 3 (moderate): Secondary | ICD-10-CM

## 2019-07-03 MED ORDER — METFORMIN HCL ER 500 MG PO TB24: 500.0000 mg | ORAL_TABLET | Freq: Every day | ORAL | 0 refills | Status: DC

## 2019-07-25 ENCOUNTER — Other Ambulatory Visit: Payer: Self-pay | Admitting: Family Medicine

## 2019-07-25 NOTE — Telephone Encounter (Signed)
Patient's husband called to request refills on :   hydrochlorothiazide (HYDRODIURIL) 12.5 MG tablet [[id number]]   Order Details Dose, Route, Frequency: As Directed  Dispense Quantity: 90 tablet Refills: 0 Fills remaining: --        Sig: Take 1 tablet by mouth once daily          &   losartan (COZAAR) 50 MG tablet [[id number]]   Order Details Dose, Route, Frequency: As Directed  Dispense Quantity: 90 tablet Refills: 0 Fills remaining: --        Sig: Take 1 tablet by mouth once     --Forwarding request to medical assistant to that if approved send refill order to:   [facility] ([address]), [place] - [place] [phone] (Phone) [phone] (Fax   --glh

## 2019-08-20 ENCOUNTER — Telehealth: Payer: Self-pay

## 2019-08-20 MED ORDER — ATORVASTATIN CALCIUM 20 MG PO TABS: 20.0000 mg | ORAL_TABLET | Freq: Every day | ORAL | 0 refills | Status: DC

## 2019-08-20 NOTE — Telephone Encounter (Signed)
Please call pt and schedule Medicare Wellness prior to any further refills.  Charyl Bigger, CMA

## 2019-08-21 ENCOUNTER — Other Ambulatory Visit: Payer: Self-pay

## 2019-08-21 NOTE — Patient Outreach (Signed)
[facility]) Care Management  08/21/2019  Jill Daugherty 1946/07/24 [id number]   Medication Adherence call to Mrs. Jill Daugherty HIPPA Compliant Voice message left with a call back number. Jill Daugherty is showing past due on Atorvastatin 20 mg under [place].   [facility] Management Direct Dial [phone]  Fax [phone] [email or web address]

## 2019-09-20 ENCOUNTER — Encounter: Payer: Self-pay | Admitting: Family Medicine

## 2019-09-20 ENCOUNTER — Other Ambulatory Visit: Payer: Self-pay

## 2019-09-20 ENCOUNTER — Ambulatory Visit (INDEPENDENT_AMBULATORY_CARE_PROVIDER_SITE_OTHER): Payer: Medicare Other | Admitting: Family Medicine

## 2019-09-20 VITALS — BP 117/63 | HR 77 | Temp 95.7°F | Ht 65.0 in | Wt 161.0 lb

## 2019-09-20 DIAGNOSIS — B354 Tinea corporis: Secondary | ICD-10-CM

## 2019-09-20 DIAGNOSIS — I10 Essential (primary) hypertension: Secondary | ICD-10-CM

## 2019-09-20 DIAGNOSIS — Z1211 Encounter for screening for malignant neoplasm of colon: Secondary | ICD-10-CM

## 2019-09-20 DIAGNOSIS — Z23 Encounter for immunization: Secondary | ICD-10-CM

## 2019-09-20 DIAGNOSIS — Z13820 Encounter for screening for osteoporosis: Secondary | ICD-10-CM

## 2019-09-20 DIAGNOSIS — Z0000 Encounter for general adult medical examination without abnormal findings: Secondary | ICD-10-CM

## 2019-09-20 DIAGNOSIS — N183 Chronic kidney disease, stage 3 (moderate): Secondary | ICD-10-CM

## 2019-09-20 DIAGNOSIS — E1159 Type 2 diabetes mellitus with other circulatory complications: Secondary | ICD-10-CM

## 2019-09-20 DIAGNOSIS — F4323 Adjustment disorder with mixed anxiety and depressed mood: Secondary | ICD-10-CM

## 2019-09-20 DIAGNOSIS — R32 Unspecified urinary incontinence: Secondary | ICD-10-CM

## 2019-09-20 DIAGNOSIS — E1122 Type 2 diabetes mellitus with diabetic chronic kidney disease: Secondary | ICD-10-CM | POA: Diagnosis not present

## 2019-09-20 DIAGNOSIS — E039 Hypothyroidism, unspecified: Secondary | ICD-10-CM | POA: Diagnosis not present

## 2019-09-20 DIAGNOSIS — I152 Hypertension secondary to endocrine disorders: Secondary | ICD-10-CM

## 2019-09-20 DIAGNOSIS — Z1239 Encounter for other screening for malignant neoplasm of breast: Secondary | ICD-10-CM

## 2019-09-20 DIAGNOSIS — Z780 Asymptomatic menopausal state: Secondary | ICD-10-CM

## 2019-09-20 DIAGNOSIS — K219 Gastro-esophageal reflux disease without esophagitis: Secondary | ICD-10-CM

## 2019-09-20 DIAGNOSIS — K582 Mixed irritable bowel syndrome: Secondary | ICD-10-CM

## 2019-09-20 DIAGNOSIS — Z0001 Encounter for general adult medical examination with abnormal findings: Secondary | ICD-10-CM | POA: Diagnosis not present

## 2019-09-20 MED ORDER — FAMOTIDINE 20 MG PO TABS: 20.0000 mg | ORAL_TABLET | Freq: Two times a day (BID) | ORAL | 1 refills | Status: DC

## 2019-09-20 MED ORDER — LORATADINE 10 MG PO TABS: 10.0000 mg | ORAL_TABLET | Freq: Every day | ORAL | 3 refills | Status: DC

## 2019-09-20 MED ORDER — AMLODIPINE BESYLATE 5 MG PO TABS: 5.0000 mg | ORAL_TABLET | Freq: Every day | ORAL | 0 refills | Status: DC

## 2019-09-20 MED ORDER — HYDROCHLOROTHIAZIDE 12.5 MG PO TABS: 12.5000 mg | ORAL_TABLET | Freq: Every day | ORAL | 0 refills | Status: DC

## 2019-09-20 MED ORDER — ATORVASTATIN CALCIUM 20 MG PO TABS: 20.0000 mg | ORAL_TABLET | Freq: Every day | ORAL | 0 refills | Status: DC

## 2019-09-20 MED ORDER — TOLTERODINE TARTRATE ER 2 MG PO CP24: 2.0000 mg | ORAL_CAPSULE | Freq: Every day | ORAL | 0 refills | Status: DC

## 2019-09-20 MED ORDER — SHINGRIX 50 MCG/0.5ML IM SUSR: 0.5000 mL | Freq: Once | INTRAMUSCULAR | 0 refills | Status: AC

## 2019-09-20 MED ORDER — SERTRALINE HCL 25 MG PO TABS: 25.0000 mg | ORAL_TABLET | Freq: Every day | ORAL | 1 refills | Status: DC

## 2019-09-20 MED ORDER — OMEPRAZOLE 20 MG PO CPDR: 20.0000 mg | DELAYED_RELEASE_CAPSULE | Freq: Every day | ORAL | 0 refills | Status: DC

## 2019-09-20 MED ORDER — LOSARTAN POTASSIUM 50 MG PO TABS: 50.0000 mg | ORAL_TABLET | Freq: Every day | ORAL | 0 refills | Status: DC

## 2019-09-20 MED ORDER — LEVOTHYROXINE SODIUM 50 MCG PO TABS: 50.0000 ug | ORAL_TABLET | Freq: Every day | ORAL | 1 refills | Status: DC

## 2019-09-20 MED ORDER — METFORMIN HCL ER 500 MG PO TB24: 500.0000 mg | ORAL_TABLET | Freq: Every day | ORAL | 0 refills | Status: DC

## 2019-09-20 MED ORDER — ALLOPURINOL 100 MG PO TABS: 100.0000 mg | ORAL_TABLET | Freq: Every day | ORAL | 0 refills | Status: DC

## 2019-09-20 MED ORDER — FLUTICASONE PROPIONATE 50 MCG/ACT NA SUSP: 1.0000 | Freq: Two times a day (BID) | NASAL | 0 refills | Status: DC

## 2019-09-20 MED ORDER — MICONAZOLE NITRATE 2 % POWD: 0 refills | Status: DC

## 2019-09-20 NOTE — Progress Notes (Signed)
Subjective:   Jill Daugherty is a 73 y.o. female who presents for Medicare Annual (Subsequent) preventive examination.   Per patient she had a colonoscopy several years ago-at least 15 to 20 years ago per patient.   She understands the risk of colon cancer yet declines colonoscopy referral.  She tells me she feels fine and does not feel she needs to go for that and does not want to put herself through it.   However we had a long discussion about Cologuard today and after much deliberation, she does agree to this.  Initially she had told the medical assistant that she did not want any of her immunizations however, upon further discussion with me explaining the importance of them, patient agrees to get the Tdap, Shingrix-told this is a 2 shot series, pneumonia vaccines and influenza vaccines   Also, patient initially said she would schedule the mammogram and DEXA scan however, I explained we could put in a referral and they would call her.  She did agree to this and we will place these orders as well today  For her 6 Cit she was unable to state the months backward but otherwise, did quite well and her memory testing.  She has no concerns about her memory and states that she occasionally forgets where she puts things however, she will soon remember.    For chronic disease management, patient has not been in to see [place] in some time due to [place] etc.  She is due for full set of fasting blood work and certainly an [id number] in the near future since last done in January 2020.       Objective:     Vitals: BP 117/63   Pulse 77   Temp (!) 95.7 F (35.4 C) (Oral)   Ht 5\' 5"  (1.651 m)   Wt 161 lb (73 kg)   BMI 26.79 kg/m   Body mass index is 26.79 kg/m.   Tobacco Social History   Tobacco Use  Smoking Status Never Smoker  Smokeless Tobacco Never Used      Past Medical History:  Diagnosis Date  . History of chicken pox   . History of frequent urinary tract infections   . Hyperlipidemia   .  Hypertension   . Overactive bladder   . Thyroid disease    History reviewed. No pertinent surgical history. Family History  Problem Relation Age of Onset  . Heart disease Father   . Diabetes Paternal Grandmother   . Cancer Paternal Grandmother   . Breast cancer Neg Hx    Social History   Socioeconomic History  . Marital status: Married    Spouse name: Not on file  . Number of children: 1  . Years of education: 67  . Highest education level: Not on file  Occupational History  . Occupation: Retired  Scientific laboratory technician  . Financial resource strain: Not on file  . Food insecurity    Worry: Not on file    Inability: Not on file  . Transportation needs    Medical: Not on file    Non-medical: Not on file  Tobacco Use  . Smoking status: Never Smoker  . Smokeless tobacco: Never Used  Substance and Sexual Activity  . Alcohol use: No    Alcohol/week: 0.0 standard drinks  . Drug use: No  . Sexual activity: Not Currently  Lifestyle  . Physical activity    Days per week: Not on file    Minutes per session: Not on  file  . Stress: Not on file  Relationships  . Social Herbalist on phone: Not on file    Gets together: Not on file    Attends religious service: Not on file    Active member of club or organization: Not on file    Attends meetings of clubs or organizations: Not on file    Relationship status: Not on file  Other Topics Concern  . Not on file  Social History Narrative   Regular exercise-no   Caffeine Use-yes    Outpatient Encounter Medications as of 09/20/2019  Medication Sig  . allopurinol (ZYLOPRIM) 100 MG tablet Take 1 tablet (100 mg total) by mouth daily.  Marland Kitchen amLODipine (NORVASC) 5 MG tablet Take 1 tablet (5 mg total) by mouth daily.  Marland Kitchen aspirin EC 81 MG tablet Take 81 mg by mouth daily.  Marland Kitchen atorvastatin (LIPITOR) 20 MG tablet Take 1 tablet (20 mg total) by mouth at bedtime. PATIENT MUST HAVE OFFICE VISIT PRIOR TO ANY FURTHER REFILLS  . Calcium Carbonate  (CALCIUM 600 PO) Take by mouth.  . Cholecalciferol (VITAMIN D3) 1000 units CAPS Take 2 capsules by mouth.  . famotidine (PEPCID) 20 MG tablet Take 1 tablet (20 mg total) by mouth 2 (two) times daily.  . fluticasone (FLONASE) 50 MCG/ACT nasal spray Place 1 spray into both nostrils 2 (two) times daily for 14 days.  . hydrochlorothiazide (HYDRODIURIL) 12.5 MG tablet Take 1 tablet (12.5 mg total) by mouth daily.  Marland Kitchen levothyroxine (SYNTHROID) 50 MCG tablet Take 1 tablet (50 mcg total) by mouth daily.  Marland Kitchen loratadine (CLARITIN) 10 MG tablet Take 1 tablet (10 mg total) by mouth daily.  Marland Kitchen losartan (COZAAR) 50 MG tablet Take 1 tablet (50 mg total) by mouth daily.  . metFORMIN (GLUCOPHAGE-XR) 500 MG 24 hr tablet Take 1 tablet (500 mg total) by mouth daily.  . Miconazole Nitrate 2 % POWD Apply to affected folds of skin twice daily only when red\ irritated  . omeprazole (PRILOSEC) 20 MG capsule Take 1 capsule (20 mg total) by mouth daily. As needed for Reflux/Heartburn  . sertraline (ZOLOFT) 25 MG tablet Take 1 tablet (25 mg total) by mouth daily.  Marland Kitchen tolterodine (DETROL LA) 2 MG 24 hr capsule Take 1 capsule (2 mg total) by mouth daily.  . [DISCONTINUED] allopurinol (ZYLOPRIM) 100 MG tablet Take 1 tablet by mouth once daily  . [DISCONTINUED] amLODipine (NORVASC) 5 MG tablet Take 1 tablet by mouth once daily  . [DISCONTINUED] atorvastatin (LIPITOR) 20 MG tablet Take 1 tablet (20 mg total) by mouth at bedtime. PATIENT MUST HAVE OFFICE VISIT PRIOR TO ANY FURTHER REFILLS  . [DISCONTINUED] famotidine (PEPCID) 20 MG tablet Take 1 tablet (20 mg total) by mouth 2 (two) times daily.  . [DISCONTINUED] fluticasone (FLONASE) 50 MCG/ACT nasal spray Place 1 spray into both nostrils 2 (two) times daily for 14 days.  . [DISCONTINUED] hydrochlorothiazide (HYDRODIURIL) 12.5 MG tablet Take 1 tablet by mouth once daily  . [DISCONTINUED] levothyroxine (SYNTHROID, LEVOTHROID) 50 MCG tablet Take 1 tablet (50 mcg total) by mouth daily.   . [DISCONTINUED] loratadine (CLARITIN) 10 MG tablet Take 1 tablet (10 mg total) by mouth daily.  . [DISCONTINUED] losartan (COZAAR) 50 MG tablet Take 1 tablet by mouth once daily  . [DISCONTINUED] metFORMIN (GLUCOPHAGE-XR) 500 MG 24 hr tablet Take 1 tablet (500 mg total) by mouth daily.  . [DISCONTINUED] Miconazole Nitrate 2 % POWD Apply to affected folds of skin twice daily only when red\  irritated  . [DISCONTINUED] omeprazole (PRILOSEC) 20 MG capsule Take 1 capsule (20 mg total) by mouth daily. As needed for Reflux/Heartburn  . [DISCONTINUED] sertraline (ZOLOFT) 25 MG tablet Take 1 tablet (25 mg total) by mouth daily.  . [DISCONTINUED] tolterodine (DETROL LA) 2 MG 24 hr capsule Take 1 capsule (2 mg total) by mouth daily.  Marland Kitchen Zoster Vaccine Adjuvanted [facility]) injection Inject 0.5 mLs into the muscle once for 1 dose.   No facility-administered encounter medications on file as of 09/20/2019.     Activities of Daily Living In your present state of health, do you have any difficulty performing the following activities: 09/20/2019  Hearing? N  Vision? N  Difficulty concentrating or making decisions? N  Walking or climbing stairs? N  Dressing or bathing? N  Doing errands, shopping? N  Some recent data might be hidden    Patient Care Team: Mellody Dance, DO as PCP - General (Family Medicine) Druscilla Brownie, MD as Consulting Physician (Dermatology) Webb Laws, [place] as Referring Physician (Optometry) Celedonio Savage, PA-C as Consulting Physician (Dermatology)    Assessment:   This is a routine wellness examination for [place].  Fall Risk Fall Risk  09/20/2019 01/16/2019 09/12/2018 03/20/2018 01/11/2018  Falls in the past year? 0 0 No No No  Follow up Falls evaluation completed Falls evaluation completed - - -   Is the patient's home free of loose throw rugs in walkways, pet beds, electrical cords, etc?   yes      Grab bars in the bathroom? no      Handrails on the stairs?   no       Adequate lighting?   yes  Timed Get Up and Go performed: not performed since pt is at home   Depression Screen PHQ 2/9 Scores 09/20/2019 02/28/2019 01/16/2019 11/07/2018  PHQ - 2 Score 0 0 0 0  PHQ- 9 Score 0 0 0 0     Cognitive Function- WNL     6CIT Screen 09/20/2019  What Year? 0 points  What month? 0 points  What time? 0 points  Count back from 20 2 points  Months in reverse 4 points  Repeat phrase 0 points  Total Score 6    Immunization History  Administered Date(s) Administered  . Influenza, High Dose Seasonal PF 10/05/2018  . Influenza,inj,Quad PF,6+ Mos 09/28/2015  . Influenza-Unspecified 08/27/2012, 07/27/2013, 08/27/2014    Qualifies for Shingles Vaccine?   Screening Tests Health Maintenance  Topic Date Due  . FOOT EXAM  05/12/2019  . HEMOGLOBIN A1C  07/17/2019  . INFLUENZA VACCINE  07/28/2019  . MAMMOGRAM  08/25/2019  . COLONOSCOPY  01/17/2020 (Originally 04/22/1996)  . TETANUS/TDAP  01/17/2020 (Originally 04/22/1965)  . Hepatitis C Screening  01/17/2020 (Originally [birth year])  . PNA vac Low Risk Adult (1 of 2 - PCV13) 01/17/2020 (Originally 04/23/2011)  . OPHTHALMOLOGY EXAM  11/17/2019  . DEXA SCAN  Completed    Cancer Screenings: Lung: Low Dose CT Chest recommended if Age 85-80 years, 30 pack-year currently smoking OR have quit w/in 15years. Patient does not qualify. Breast:  Up to date on Mammogram? No   Up to date of Bone Density/Dexa? No Colorectal: pt declined  Additional Screenings: : Hepatitis C Screening: pt declined HIV:  Pt declined     Plan:   Orders Placed This Encounter  Procedures  . MM Digital Screening  . DG Bone Density  . Cologuard   -Also told CMA patient desires Tdap, Shingrix, pneumonia vaccines, and flu vaccine for  which she will come in in the near future for fasting blood work and at that time she can obtain these immunizations/vaccines  -1 week after she comes in for full set of fasting blood work, I will see her in  the office per patient request, in person, to review the blood work and for chronic disease management.   Per patient request all her medications were refilled today for just 190-day supply until she comes in in the near future.  Meds ordered this encounter  Medications  . allopurinol (ZYLOPRIM) 100 MG tablet    Sig: Take 1 tablet (100 mg total) by mouth daily.    Dispense:  90 tablet    Refill:  0  . amLODipine (NORVASC) 5 MG tablet    Sig: Take 1 tablet (5 mg total) by mouth daily.    Dispense:  90 tablet    Refill:  0  . atorvastatin (LIPITOR) 20 MG tablet    Sig: Take 1 tablet (20 mg total) by mouth at bedtime. PATIENT MUST HAVE OFFICE VISIT PRIOR TO ANY FURTHER REFILLS    Dispense:  30 tablet    Refill:  0  . famotidine (PEPCID) 20 MG tablet    Sig: Take 1 tablet (20 mg total) by mouth 2 (two) times daily.    Dispense:  180 tablet    Refill:  1  . fluticasone (FLONASE) 50 MCG/ACT nasal spray    Sig: Place 1 spray into both nostrils 2 (two) times daily for 14 days.    Dispense:  1 g    Refill:  0  . hydrochlorothiazide (HYDRODIURIL) 12.5 MG tablet    Sig: Take 1 tablet (12.5 mg total) by mouth daily.    Dispense:  90 tablet    Refill:  0  . levothyroxine (SYNTHROID) 50 MCG tablet    Sig: Take 1 tablet (50 mcg total) by mouth daily.    Dispense:  90 tablet    Refill:  1  . loratadine (CLARITIN) 10 MG tablet    Sig: Take 1 tablet (10 mg total) by mouth daily.    Dispense:  90 tablet    Refill:  3  . losartan (COZAAR) 50 MG tablet    Sig: Take 1 tablet (50 mg total) by mouth daily.    Dispense:  90 tablet    Refill:  0  . metFORMIN (GLUCOPHAGE-XR) 500 MG 24 hr tablet    Sig: Take 1 tablet (500 mg total) by mouth daily.    Dispense:  90 tablet    Refill:  0  . Miconazole Nitrate 2 % POWD    Sig: Apply to affected folds of skin twice daily only when red\ irritated    Dispense:  500 g    Refill:  0  . omeprazole (PRILOSEC) 20 MG capsule    Sig: Take 1 capsule (20 mg  total) by mouth daily. As needed for Reflux/Heartburn    Dispense:  90 capsule    Refill:  0  . sertraline (ZOLOFT) 25 MG tablet    Sig: Take 1 tablet (25 mg total) by mouth daily.    Dispense:  90 tablet    Refill:  1  . tolterodine (DETROL LA) 2 MG 24 hr capsule    Sig: Take 1 capsule (2 mg total) by mouth daily.    Dispense:  90 capsule    Refill:  0  . Zoster Vaccine Adjuvanted [facility]) injection    Sig: Inject 0.5 mLs into the muscle  once for 1 dose.    Dispense:  0.5 mL    Refill:  0      I have personally reviewed and noted the following in the patient's chart:   . Medical and social history . Use of alcohol, tobacco or illicit drugs  . Current medications and supplements . Functional ability and status . Nutritional status . Physical activity . Advanced directives . List of other physicians . Hospitalizations, surgeries, and ER visits in previous 12 months . Vitals . Screenings to include cognitive, depression, and falls . Referrals and appointments  In addition, I have reviewed and discussed with patient certain preventive protocols, quality metrics, and best practice recommendations. A written personalized care plan for preventive services as well as general preventive health recommendations were provided to patient.     Mellody Dance, DO  09/20/2019

## 2019-09-25 ENCOUNTER — Other Ambulatory Visit: Payer: Self-pay

## 2019-09-25 DIAGNOSIS — Z1211 Encounter for screening for malignant neoplasm of colon: Secondary | ICD-10-CM | POA: Diagnosis not present

## 2019-09-25 DIAGNOSIS — Z0000 Encounter for general adult medical examination without abnormal findings: Secondary | ICD-10-CM | POA: Diagnosis not present

## 2019-09-25 LAB — COLOGUARD

## 2019-09-25 NOTE — Patient Outreach (Signed)
[facility]) Care Management  09/25/2019  Jill Daugherty [birth year] [id number]   Medication Adherence call to Mrs. Jill Daugherty HIPPA Compliant Voice message left with a call back number. Jill Daugherty is showing past due on Atorvastatin 20 mg under [place].   [place] Management Direct Dial [phone]  Fax [phone] [email or web address]

## 2019-09-26 NOTE — Telephone Encounter (Signed)
This encounter was created in error - please disregard.

## 2019-10-01 ENCOUNTER — Telehealth: Payer: Self-pay

## 2019-10-01 LAB — COLPOSCOPY: Cologuard: POSITIVE — AB

## 2019-10-01 NOTE — Telephone Encounter (Signed)
Informed patient of positive Cologurad and recommendations.  Patient refused referral at this time.

## 2019-10-03 ENCOUNTER — Telehealth: Payer: Self-pay | Admitting: Family Medicine

## 2019-10-03 NOTE — Telephone Encounter (Signed)
Patient is aware.  Patient does not want to proceed.

## 2019-10-03 NOTE — Telephone Encounter (Signed)
Cologuard rep called to confirm pt's results were (Positive).  ---Forwarding message to medical assistant.  --Dion Body

## 2019-10-09 ENCOUNTER — Other Ambulatory Visit: Payer: Medicare Other

## 2019-10-09 ENCOUNTER — Ambulatory Visit (INDEPENDENT_AMBULATORY_CARE_PROVIDER_SITE_OTHER): Payer: Medicare Other

## 2019-10-09 ENCOUNTER — Other Ambulatory Visit: Payer: Self-pay

## 2019-10-09 DIAGNOSIS — Z0000 Encounter for general adult medical examination without abnormal findings: Secondary | ICD-10-CM | POA: Diagnosis not present

## 2019-10-09 DIAGNOSIS — N183 Chronic kidney disease, stage 3 unspecified: Secondary | ICD-10-CM | POA: Diagnosis not present

## 2019-10-09 DIAGNOSIS — Z23 Encounter for immunization: Secondary | ICD-10-CM

## 2019-10-09 DIAGNOSIS — E559 Vitamin D deficiency, unspecified: Secondary | ICD-10-CM

## 2019-10-09 DIAGNOSIS — E1122 Type 2 diabetes mellitus with diabetic chronic kidney disease: Secondary | ICD-10-CM | POA: Diagnosis not present

## 2019-10-09 DIAGNOSIS — E785 Hyperlipidemia, unspecified: Secondary | ICD-10-CM

## 2019-10-09 DIAGNOSIS — E039 Hypothyroidism, unspecified: Secondary | ICD-10-CM

## 2019-10-09 DIAGNOSIS — I10 Essential (primary) hypertension: Secondary | ICD-10-CM

## 2019-10-09 DIAGNOSIS — R5383 Other fatigue: Secondary | ICD-10-CM

## 2019-10-10 LAB — CBC WITH DIFFERENTIAL/PLATELET
Basophils Absolute: 0.1 10*3/uL (ref 0.0–0.2)
Basos: 1 %
EOS (ABSOLUTE): 0.2 10*3/uL (ref 0.0–0.4)
Eos: 3 %
Hematocrit: 43.9 % (ref 34.0–46.6)
Hemoglobin: 14.8 g/dL (ref 11.1–15.9)
Immature Grans (Abs): 0 10*3/uL (ref 0.0–0.1)
Immature Granulocytes: 0 %
Lymphocytes Absolute: 1.8 10*3/uL (ref 0.7–3.1)
Lymphs: 34 %
MCH: 31 pg (ref 26.6–33.0)
MCHC: 33.7 g/dL (ref 31.5–35.7)
MCV: 92 fL (ref 79–97)
Monocytes Absolute: 0.5 10*3/uL (ref 0.1–0.9)
Monocytes: 10 %
Neutrophils Absolute: 2.7 10*3/uL (ref 1.4–7.0)
Neutrophils: 52 %
Platelets: 236 10*3/uL (ref 150–450)
RBC: 4.78 x10E6/uL (ref 3.77–5.28)
RDW: 12.2 % (ref 11.7–15.4)
WBC: 5.3 10*3/uL (ref 3.4–10.8)

## 2019-10-10 LAB — COMPREHENSIVE METABOLIC PANEL
ALT: 37 IU/L — ABNORMAL HIGH (ref 0–32)
AST: 26 IU/L (ref 0–40)
Albumin/Globulin Ratio: 2.2 (ref 1.2–2.2)
Albumin: 5 g/dL — ABNORMAL HIGH (ref 3.7–4.7)
Alkaline Phosphatase: 74 IU/L (ref 39–117)
BUN/Creatinine Ratio: 17 (ref 12–28)
BUN: 16 mg/dL (ref 8–27)
Bilirubin Total: 0.5 mg/dL (ref 0.0–1.2)
CO2: 24 mmol/L (ref 20–29)
Calcium: 9.8 mg/dL (ref 8.7–10.3)
Chloride: 101 mmol/L (ref 96–106)
Creatinine, Ser: 0.93 mg/dL (ref 0.57–1.00)
GFR calc Af Amer: 71 mL/min/{1.73_m2} (ref >59)
GFR calc non Af Amer: 61 mL/min/{1.73_m2} (ref >59)
Globulin, Total: 2.3 g/dL (ref 1.5–4.5)
Glucose: 131 mg/dL — ABNORMAL HIGH (ref 65–99)
Potassium: 4.4 mmol/L (ref 3.5–5.2)
Sodium: 142 mmol/L (ref 134–144)
Total Protein: 7.3 g/dL (ref 6.0–8.5)

## 2019-10-10 LAB — VITAMIN D 25 HYDROXY (VIT D DEFICIENCY, FRACTURES): Vit D, 25-Hydroxy: 48.4 ng/mL (ref 30.0–100.0)

## 2019-10-10 LAB — T4, FREE: Free T4: 1.33 ng/dL (ref 0.82–1.77)

## 2019-10-10 LAB — LIPID PANEL
Chol/HDL Ratio: 4.4 ratio (ref 0.0–4.4)
Cholesterol, Total: 209 mg/dL — ABNORMAL HIGH (ref 100–199)
HDL: 47 mg/dL (ref >39)
LDL Chol Calc (NIH): 125 mg/dL — ABNORMAL HIGH (ref 0–99)
Triglycerides: 208 mg/dL — ABNORMAL HIGH (ref 0–149)
VLDL Cholesterol Cal: 37 mg/dL (ref 5–40)

## 2019-10-10 LAB — TSH: TSH: 1.81 u[IU]/mL (ref 0.450–4.500)

## 2019-10-10 LAB — VITAMIN B12: Vitamin B-12: 689 pg/mL (ref 232–1245)

## 2019-10-10 LAB — HEMOGLOBIN A1C
Est. average glucose Bld gHb Est-mCnc: 134 mg/dL
Hgb A1c MFr Bld: 6.3 % — ABNORMAL HIGH (ref 4.8–5.6)

## 2019-10-16 ENCOUNTER — Other Ambulatory Visit: Payer: Self-pay

## 2019-10-16 ENCOUNTER — Encounter: Payer: Self-pay | Admitting: Family Medicine

## 2019-10-16 ENCOUNTER — Ambulatory Visit (INDEPENDENT_AMBULATORY_CARE_PROVIDER_SITE_OTHER): Payer: Medicare Other | Admitting: Family Medicine

## 2019-10-16 VITALS — BP 119/82 | HR 77 | Temp 98.1°F | Resp 18 | Ht 65.0 in | Wt 157.6 lb

## 2019-10-16 DIAGNOSIS — E782 Mixed hyperlipidemia: Secondary | ICD-10-CM

## 2019-10-16 DIAGNOSIS — E039 Hypothyroidism, unspecified: Secondary | ICD-10-CM | POA: Diagnosis not present

## 2019-10-16 DIAGNOSIS — N183 Chronic kidney disease, stage 3 unspecified: Secondary | ICD-10-CM

## 2019-10-16 DIAGNOSIS — E1169 Type 2 diabetes mellitus with other specified complication: Secondary | ICD-10-CM

## 2019-10-16 DIAGNOSIS — E559 Vitamin D deficiency, unspecified: Secondary | ICD-10-CM

## 2019-10-16 DIAGNOSIS — E1159 Type 2 diabetes mellitus with other circulatory complications: Secondary | ICD-10-CM

## 2019-10-16 DIAGNOSIS — I10 Essential (primary) hypertension: Secondary | ICD-10-CM

## 2019-10-16 DIAGNOSIS — E1122 Type 2 diabetes mellitus with diabetic chronic kidney disease: Secondary | ICD-10-CM | POA: Diagnosis not present

## 2019-10-16 DIAGNOSIS — I152 Hypertension secondary to endocrine disorders: Secondary | ICD-10-CM

## 2019-10-16 MED ORDER — ATORVASTATIN CALCIUM 40 MG PO TABS: 40.0000 mg | ORAL_TABLET | Freq: Every day | ORAL | 1 refills | Status: DC

## 2019-10-16 MED ORDER — HYDROCHLOROTHIAZIDE 12.5 MG PO TABS: 12.5000 mg | ORAL_TABLET | Freq: Every day | ORAL | 0 refills | Status: DC

## 2019-10-16 MED ORDER — LOSARTAN POTASSIUM 50 MG PO TABS: 50.0000 mg | ORAL_TABLET | Freq: Every day | ORAL | 0 refills | Status: DC

## 2019-10-16 NOTE — Patient Instructions (Signed)
Make sure you go for your yearly skin screenings with Dermtology and also yrly Diabetic eye exams with your eye doctor.       Diabetes Mellitus and Nutrition, Adult When you have diabetes (diabetes mellitus), it is very important to have healthy eating habits because your blood sugar (glucose) levels are greatly affected by what you eat and drink. Eating healthy foods in the appropriate amounts, at about the same times every day, can help you:  Control your blood glucose.  Lower your risk of heart disease.  Improve your blood pressure.  Reach or maintain a healthy weight. Every person with diabetes is different, and each person has different needs for a meal plan. Your health care provider may recommend that you work with a diet and nutrition specialist (dietitian) to make a meal plan that is best for you. Your meal plan may vary depending on factors such as:  The calories you need.  The medicines you take.  Your weight.  Your blood glucose, blood pressure, and cholesterol levels.  Your activity level.  Other health conditions you have, such as heart or kidney disease. How do carbohydrates affect me? Carbohydrates, also called carbs, affect your blood glucose level more than any other type of food. Eating carbs naturally raises the amount of glucose in your blood. Carb counting is a method for keeping track of how many carbs you eat. Counting carbs is important to keep your blood glucose at a healthy level, especially if you use insulin or take certain oral diabetes medicines. It is important to know how many carbs you can safely have in each meal. This is different for every person. Your dietitian can help you calculate how many carbs you should have at each meal and for each snack. Foods that contain carbs include:  Bread, cereal, rice, pasta, and crackers.  Potatoes and corn.  Peas, beans, and lentils.  Milk and yogurt.  Fruit and juice.  Desserts, such as cakes,  cookies, ice cream, and candy. How does alcohol affect me? Alcohol can cause a sudden decrease in blood glucose (hypoglycemia), especially if you use insulin or take certain oral diabetes medicines. Hypoglycemia can be a life-threatening condition. Symptoms of hypoglycemia (sleepiness, dizziness, and confusion) are similar to symptoms of having too much alcohol. If your health care provider says that alcohol is safe for you, follow these guidelines:  Limit alcohol intake to no more than 1 drink per day for nonpregnant women and 2 drinks per day for men. One drink equals 12 oz of beer, 5 oz of wine, or 1 oz of hard liquor.  Do not drink on an empty stomach.  Keep yourself hydrated with water, diet soda, or unsweetened iced tea.  Keep in mind that regular soda, juice, and other mixers may contain a lot of sugar and must be counted as carbs. What are tips for following this plan?  Reading food labels  Start by checking the serving size on the "Nutrition Facts" label of packaged foods and drinks. The amount of calories, carbs, fats, and other nutrients listed on the label is based on one serving of the item. Many items contain more than one serving per package.  Check the total grams (g) of carbs in one serving. You can calculate the number of servings of carbs in one serving by dividing the total carbs by 15. For example, if a food has 30 g of total carbs, it would be equal to 2 servings of carbs.  Check the number  of grams (g) of saturated and trans fats in one serving. Choose foods that have low or no amount of these fats.  Check the number of milligrams (mg) of salt (sodium) in one serving. Most people should limit total sodium intake to less than 2,300 mg per day.  Always check the nutrition information of foods labeled as "low-fat" or "nonfat". These foods may be higher in added sugar or refined carbs and should be avoided.  Talk to your dietitian to identify your daily goals for  nutrients listed on the label. Shopping  Avoid buying canned, premade, or processed foods. These foods tend to be high in fat, sodium, and added sugar.  Shop around the outside edge of the grocery store. This includes fresh fruits and vegetables, bulk grains, fresh meats, and fresh dairy. Cooking  Use low-heat cooking methods, such as baking, instead of high-heat cooking methods like deep frying.  Cook using healthy oils, such as olive, canola, or sunflower oil.  Avoid cooking with butter, cream, or high-fat meats. Meal planning  Eat meals and snacks regularly, preferably at the same times every day. Avoid going long periods of time without eating.  Eat foods high in fiber, such as fresh fruits, vegetables, beans, and whole grains. Talk to your dietitian about how many servings of carbs you can eat at each meal.  Eat 4-6 ounces (oz) of lean protein each day, such as lean meat, chicken, fish, eggs, or tofu. One oz of lean protein is equal to: ? 1 oz of meat, chicken, or fish. ? 1 egg. ?  cup of tofu.  Eat some foods each day that contain healthy fats, such as avocado, nuts, seeds, and fish. Lifestyle  Check your blood glucose regularly.  Exercise regularly as told by your health care provider. This may include: ? 150 minutes of moderate-intensity or vigorous-intensity exercise each week. This could be brisk walking, biking, or water aerobics. ? Stretching and doing strength exercises, such as yoga or weightlifting, at least 2 times a week.  Take medicines as told by your health care provider.  Do not use any products that contain nicotine or tobacco, such as cigarettes and e-cigarettes. If you need help quitting, ask your health care provider.  Work with a Social worker or diabetes educator to identify strategies to manage stress and any emotional and social challenges. Questions to ask a health care provider  Do I need to meet with a diabetes educator?  Do I need to meet with a  dietitian?  What number can I call if I have questions?  When are the best times to check my blood glucose? Where to find more information:  American Diabetes Association: diabetes.org  Academy of Nutrition and Dietetics: www.eatright.[employer] of Diabetes and Digestive and Kidney Diseases (NIH): [employer] Summary  A healthy meal plan will help you control your blood glucose and maintain a healthy lifestyle.  Working with a diet and nutrition specialist (dietitian) can help you make a meal plan that is best for you.  Keep in mind that carbohydrates (carbs) and alcohol have immediate effects on your blood glucose levels. It is important to count carbs and to use alcohol carefully. This information is not intended to replace advice given to you by your health care provider. Make sure you discuss any questions you have with your health care provider. Document Released: 09/09/2005 Document Revised: 11/25/2017 Document Reviewed: 01/17/2017 Elsevier Patient Education  2020 [employer].

## 2019-10-16 NOTE — Progress Notes (Signed)
Impression and Recommendations:    1. Type 2 diabetes mellitus with stage 3 chronic kidney disease, unspecified whether long term insulin use, unspecified whether stage 3a or 3b CKD ([place])   2. Mixed diabetic hyperlipidemia associated with type 2 diabetes mellitus ([place])   3. Hypertension associated with diabetes ([place])   4. Type 2 diabetes mellitus with other specified complication, without long-term current use of insulin ([place])   5. Hypothyroidism, unspecified type   6. Vitamin D deficiency     - Extensive education provided today about prescription safety and periodic recalls.  - Reviewed recent lab work (10/09/2019) in depth with patient today.  All lab work within normal limits unless otherwise noted.  Extensive education provided and all questions answered.  Diabetes Mellitus with Stage 3 CKD - HbA1c measured at 6.3 last check, down from 6.7 nine months ago. - A1c most recently is at goal  - Pt will continue current treatment regimen.  See med list.  - Counseled patient on pathophysiology of disease and discussed various treatment options, which always includes dietary and lifestyle modification as first line.    - Importance of low carb, heart-healthy diet discussed with patient in addition to regular aerobic exercise of 4mn 5d/week or more.   - Check FBS and 2 hours after the biggest meal of your day.  Keep log and bring in next OV for my review.     - Also told patient if you ever feel poorly, please check your blood pressure and blood sugar, as one or the other could be the cause of your symptoms.  - Pt reminded about need for yearly eye and foot exams.  Told patient to make appt.for diabetic eye exam, CMAs here will do foot exams  - Handouts provided at patient's desire and or told to go online at the American Diabetes Association website for further information  - We will continue to monitor  Mixed Diabetic Hyperlipidemia - Lipid Panel Last checked 10/09/2019 -  05/09/2018 was last time patient's cholesterol was checked. - LDL was 57 in 2019, now up to 125. - Triglycerides = 137 in 2019, now up to 208. - HDL stable at 47.  - Cholesterol levels are not at goal. - Per patient, continues taking statin as prescribed, "doing the same thing I've been doing." - States she has pill boxes and "got everything in there."  - Discussed increasing dose of Lipitor to 40 mg nightly.  See med list. - To prevent S-E, advised patient to hydrate and engage in physical activity.  - Will re-check AST & ALT liver enzymes and FLP in 3-4 months. - Discussed that ALT liver enzyme increased to 37 last check.  - Prudent dietary changes such as low saturated & trans fat diets for hyperlipidemia and low carb diets for hypertriglyceridemia discussed with patient.  Discussed decreasing consumption of red meat, cheese, pork, fried foods.  - Encouraged patient to follow AHA guidelines for regular exercise and also engage in weight loss if BMI above 25.   - Educational handouts provided at patient's desire and/ or told to look online at the [employer] for further information.  - We will continue to monitor  Hypertension associated with DM - BP elevated on intake today. - Per pt, not checking BP at home. - Reviewed goal BP of 140/90 or less.  - Encouraged patient to check her BP at home and keep a log to bring to next OV. - Patient agrees to  resume checking BP and bring in for review. - Will re-evaluate treatment plan according to updated BP.  - Continue treatment plan as prescribed. - If BP is elevated at home, patient agrees to call in with updates sooner.  - Will continue to monitor.  Vitamin D Deficiency - Last measured WNL at 48.4. - Discussed that this falls well within the range of 40-60. - Will continue to monitor.  Hypothyroidism - Stable at this time. - TSH, T4 last checked WNL. - Continue synthroid as prescribed. - Will continue  to monitor.  Health Counseling & Preventative Health Maintenance - Advised patient to continue working toward exercising to improve overall mental, physical, and emotional health.    - Reviewed the "spokes of the wheel" of mood and health management.  Stressed the importance of ongoing prudent habits, including regular exercise, appropriate sleep hygiene, healthful dietary habits, and prayer/meditation to relax.  - Encouraged patient to engage in daily physical activity, especially a formal exercise routine.  Recommended that the patient eventually strive for at least 150 minutes of moderate cardiovascular activity per week according to guidelines established by the [facility].   - Healthy dietary habits encouraged, including low-carb, and high amounts of lean protein in diet.   - Patient should also consume adequate amounts of water.  - Health counseling performed.  All questions answered.  - Novel Covid -19 counseling done; all questions were answered.   - Current CDC / federal and [place] guidelines reviewed with patient  - Reminded pt of extreme importance of social distancing; wearing a mask when out in public; insensate handwashing and cleaning of surfaces, avoiding unnecessary trips for shopping and avoiding ALL but emergency appts etc. - Told patient to be prepared, not scared; and be smart for the sake of others - Patient will call with any additional concerns   Education and routine counseling performed. Handouts provided.  Recommendations - Discussed need for patient to obtain mammogram and bone density scan as recommended. - Advised patient to return to dermatology yearly.  Orders Placed This Encounter  Procedures  . ALT  . Lipid panel  . Hemoglobin A1c    Medications Discontinued During This Encounter  Medication Reason  . atorvastatin (LIPITOR) 20 MG tablet Reorder  . hydrochlorothiazide (HYDRODIURIL) 12.5 MG tablet Reorder  . losartan (COZAAR) 50 MG tablet Reorder       Meds ordered this encounter  Medications  . atorvastatin (LIPITOR) 40 MG tablet    Sig: Take 1 tablet (40 mg total) by mouth at bedtime.    Dispense:  90 tablet    Refill:  1  . hydrochlorothiazide (HYDRODIURIL) 12.5 MG tablet    Sig: Take 1 tablet (12.5 mg total) by mouth daily.    Dispense:  90 tablet    Refill:  0  . losartan (COZAAR) 50 MG tablet    Sig: Take 1 tablet (50 mg total) by mouth daily.    Dispense:  90 tablet    Refill:  0    The patient was counseled, risk factors were discussed, anticipatory guidance given.  Gross side effects, risk and benefits, and alternatives of medications discussed with patient.  Patient is aware that all medications have potential side effects and we are unable to predict every side effect or drug-drug interaction that may occur.  Expresses verbal understanding and consents to current therapy plan and treatment regimen.   Return for OV in 3-4 months, with ALT, A1c, and FLP 3 days prior.   Please see  AVS handed out to patient at the end of our visit for further patient instructions/ counseling done pertaining to today's office visit.    Note:  This document was prepared using Dragon voice recognition software and may include unintentional dictation errors.   This document serves as a record of services personally performed by Mellody Dance, DO. It was created on her behalf by Toni Amend, a trained medical scribe. The creation of this record is based on the scribe's personal observations and the provider's statements to them.   I have reviewed the above medical documentation for accuracy and completeness and I concur.  Mellody Dance, DO 10/17/2019 12:30 PM        Subjective:    Chief Complaint  Patient presents with  . Follow-up     Jill Daugherty is a 73 y.o. female who presents to [place] [facility] today for Diabetes Management.    Overall states "I feel great."  Says "I haven't  done anything" different.  Has continued taking Vitamin D OTC.  Notes also takes aspirin and calcium, and Vitamin C.  Notes she hasn't been drinking as much water as she needs to, but "I have been walking every day."  Says she still goes to [employer] and walks.  Says "everyone wears masks there," and "they have moved everything around."  Notes planting flowers, walking her dog, went to the beach 3 weeks ago.  Confirms washing her hands, staying away from people.  Notes recently had a tooth pulled with Dr. Kalman Shan and was told to take ibuprofen afterward.  Says "I need to call about my mammogram."  She does not go to dermatologist, but she is established.  Notes plans to follow up with Celedonio Savage of dermatology soon.  DM HPI: -  She has been working on diet and exercise for diabetes  Pt is currently maintained on the following medications for diabetes:   see med list today Medication compliance - continues as prescribed.  Home glucose readings range: Checking every morning, and notes was 122 this morning.  "Sometimes it's 142, 144, and I don't know what I did wrong."   Denies polyuria/polydipsia. Denies hypo/ hyperglycemia symptoms - She denies new onset of: chest pain, exercise intolerance, shortness of breath, dizziness, visual changes, headache, lower extremity swelling or claudication.   Last diabetic eye exam was  Lab Results  Component Value Date   HMDIABEYEEXA No Retinopathy 11/16/2018    Foot exam- UTD  Last A1C in the office was:  Lab Results  Component Value Date   HGBA1C 6.3 (H) 10/09/2019   HGBA1C 6.7 (A) 01/16/2019   HGBA1C 6.6 (A) 09/12/2018    Lab Results  Component Value Date   MICROALBUR 10 02/28/2019   [place] 125 (H) 10/09/2019   CREATININE 0.93 10/09/2019    HPI:  Hyperlipidemia:  73 y.o. female here for cholesterol follow-up.   - Patient reports good compliance with treatment plan of:  medication and/ or lifestyle management.    States she  has been taking her cholesterol medications nightly. Reiterates several times that "I haven't changed anything."  - Patient denies any acute concerns or problems with management plan   - She denies new onset of: myalgias, arthralgias, increased fatigue more than normal, chest pains, exercise intolerance, shortness of breath, dizziness, visual changes, headache, lower extremity swelling or claudication.   Most recent cholesterol panel was:  Lab Results  Component Value Date   CHOL 209 (H) 10/09/2019   HDL  47 10/09/2019   LDLCALC 125 (H) 10/09/2019   LDLDIRECT 94.0 12/23/2015   TRIG 208 (H) 10/09/2019   CHOLHDL 4.4 10/09/2019   Hepatic Function Latest Ref Rng & Units 10/09/2019 05/09/2018 01/30/2018  Total Protein 6.0 - 8.5 g/dL 7.3 6.6 7.1  Albumin 3.7 - 4.7 g/dL 5.0(H) 4.5 4.6  AST 0 - 40 IU/L _0 ALT 0 - 32 IU/L 37(H) 28 19  Alk Phosphatase 39 - 117 IU/L 74 64 73  Total Bilirubin 0.0 - 1.2 mg/dL 0.5 0.5 0.3    HPI:  Hypertension:  -  Her blood pressure at home has been running: "I haven't really been taking it, but I will start back."  - Patient reports good compliance with medication and/or lifestyle modification  - Her denies acute concerns or problems related to treatment plan  - She denies new onset of: chest pain, exercise intolerance, shortness of breath, dizziness, visual changes, headache, lower extremity swelling or claudication.   Last 3 blood pressure readings in our office are as follows: BP Readings from Last 3 Encounters:  10/16/19 119/82  09/20/19 117/63  02/28/19 140/82   Filed Weights   10/16/19 0944  Weight: 157 lb 9.6 oz (71.5 kg)       Last 3 blood pressure readings in our office are as follows: BP Readings from Last 3 Encounters:  10/16/19 119/82  09/20/19 117/63  02/28/19 140/82    BMI Readings from Last 3 Encounters:  10/16/19 26.23 kg/m  09/20/19 26.79 kg/m  02/28/19 27.86 kg/m     No problems updated.    Patient Care  Team    Relationship Specialty Notifications Start End  Mellody Dance, DO PCP - General Family Medicine  12/09/17   Druscilla Brownie, MD Consulting Physician Dermatology  01/11/18   Webb Laws, [place] Referring Physician Optometry  01/11/18   Celedonio Savage, PA-C Consulting Physician Dermatology  01/11/18    Comment: derm      Patient Active Problem List   Diagnosis Date Noted  . Type 2 diabetes mellitus with diabetic chronic kidney disease ([place]) 05/11/2018    Priority: High  . Hypertension associated with diabetes ([place]) 01/11/2018    Priority: High  . Mixed diabetic hyperlipidemia associated with type 2 diabetes mellitus ([place]) 01/11/2018    Priority: High  . DM2 (diabetes mellitus, type 2) ([place]) 06/23/2015    Priority: High  . Gout 06/23/2015    Priority: Medium  . GERD (gastroesophageal reflux disease) 06/23/2015    Priority: Medium  . Hypothyroidism 06/21/2013    Priority: Medium  . Family history of early CAD- dad in early 60's 02/07/2018    Priority: Low  . Family history of cerebrovascular accident (CVA) in mother- in her 3's 02/07/2018    Priority: Low  . Family history of diabetes mellitus (DM)- PGM- in 40's 02/07/2018    Priority: Low  . Family history of Alzheimer's disease- Mom in her 79 02/07/2018    Priority: Low  . Environmental and seasonal allergies 01/11/2018    Priority: Low  . Vitamin D deficiency 01/11/2018    Priority: Low  . Adjustment disorder with mixed anxiety and depressed mood 02/28/2019  . Urinary incontinence in female 11/07/2018  . Gastroesophageal reflux disease 11/07/2018  . Irritable bowel syndrome with both constipation and diarrhea 09/12/2018  . Tinea corporis- lower abd folds 09/12/2018  . Acute maxillary sinusitis 04/10/2018  . Acute otitis media 04/10/2018  . Cough in adult 04/10/2018  . Noncompliance with diet and medication  regimen 03/20/2018  . OAB (overactive bladder) 06/23/2015  . HTN (hypertension) 06/21/2013  .  Hyperlipidemia 06/21/2013     Past Medical History:  Diagnosis Date  . History of chicken pox   . History of frequent urinary tract infections   . Hyperlipidemia   . Hypertension   . Overactive bladder   . Thyroid disease      History reviewed. No pertinent surgical history.   Family History  Problem Relation Age of Onset  . Heart disease Father   . Diabetes Paternal Grandmother   . Cancer Paternal Grandmother   . Breast cancer Neg Hx      Social History   Substance and Sexual Activity  Drug Use No  ,  Social History   Substance and Sexual Activity  Alcohol Use No  . Alcohol/week: 0.0 standard drinks  ,  Social History   Tobacco Use  Smoking Status Never Smoker  Smokeless Tobacco Never Used  ,    Current Outpatient Medications on File Prior to Visit  Medication Sig Dispense Refill  . allopurinol (ZYLOPRIM) 100 MG tablet Take 1 tablet (100 mg total) by mouth daily. 90 tablet 0  . amLODipine (NORVASC) 5 MG tablet Take 1 tablet (5 mg total) by mouth daily. 90 tablet 0  . aspirin EC 81 MG tablet Take 81 mg by mouth daily.    . Calcium Carbonate (CALCIUM 600 PO) Take by mouth.    . Cholecalciferol (VITAMIN D3) 1000 units CAPS Take 2 capsules by mouth.    . famotidine (PEPCID) 20 MG tablet Take 1 tablet (20 mg total) by mouth 2 (two) times daily. 180 tablet 1  . levothyroxine (SYNTHROID) 50 MCG tablet Take 1 tablet (50 mcg total) by mouth daily. 90 tablet 1  . loratadine (CLARITIN) 10 MG tablet Take 1 tablet (10 mg total) by mouth daily. 90 tablet 3  . metFORMIN (GLUCOPHAGE-XR) 500 MG 24 hr tablet Take 1 tablet (500 mg total) by mouth daily. 90 tablet 0  . Miconazole Nitrate 2 % POWD Apply to affected folds of skin twice daily only when red\ irritated 500 g 0  . omeprazole (PRILOSEC) 20 MG capsule Take 1 capsule (20 mg total) by mouth daily. As needed for Reflux/Heartburn 90 capsule 0  . sertraline (ZOLOFT) 25 MG tablet Take 1 tablet (25 mg total) by mouth daily.  90 tablet 1  . tolterodine (DETROL LA) 2 MG 24 hr capsule Take 1 capsule (2 mg total) by mouth daily. 90 capsule 0  . fluticasone (FLONASE) 50 MCG/ACT nasal spray Place 1 spray into both nostrils 2 (two) times daily for 14 days. 1 g 0   No current facility-administered medications on file prior to visit.      No Known Allergies   Review of Systems:   General:  Denies fever, chills Optho/Auditory:   Denies visual changes, blurred vision Respiratory:   Denies SOB, cough, wheeze, DIB  Cardiovascular:   Denies chest pain, palpitations, painful respirations Gastrointestinal:   Denies nausea, vomiting, diarrhea.  Endocrine:     Denies new hot or cold intolerance Musculoskeletal:  Denies joint swelling, gait issues, or new unexplained myalgias/ arthralgias Skin:  Denies rash, suspicious lesions  Neurological:    Denies dizziness, unexplained weakness, numbness  Psychiatric/Behavioral:   Denies mood changes    Objective:     Blood pressure 119/82, pulse 77, temperature 98.1 F (36.7 C), resp. rate 18, height _0  (1.651 m), weight 157 lb 9.6 oz (71.5 kg), SpO2 97 %.  Body mass index is 26.23 kg/m.  General: Well Developed, well nourished, and in no acute distress.  HEENT: Normocephalic, atraumatic, pupils equal round reactive to light, neck supple, No carotid bruits, no JVD Skin: Warm and dry, cap RF less 2 sec Cardiac: Regular rate and rhythm, S1, S2 WNL's, no murmurs rubs or gallops Respiratory: ECTA B/L, Not using accessory muscles, speaking in full sentences. NeuroM-Sk: Ambulates w/o assistance, moves ext * 4 w/o difficulty, sensation grossly intact.  Ext: scant edema b/l lower ext Psych: No HI/SI, judgement and insight good, Euthymic mood. Full Affect.

## 2019-11-20 DIAGNOSIS — L821 Other seborrheic keratosis: Secondary | ICD-10-CM | POA: Diagnosis not present

## 2019-11-20 DIAGNOSIS — M67449 Ganglion, unspecified hand: Secondary | ICD-10-CM | POA: Diagnosis not present

## 2019-11-20 DIAGNOSIS — B078 Other viral warts: Secondary | ICD-10-CM | POA: Diagnosis not present

## 2019-11-25 ENCOUNTER — Other Ambulatory Visit: Payer: Self-pay | Admitting: Family Medicine

## 2019-11-25 DIAGNOSIS — I152 Hypertension secondary to endocrine disorders: Secondary | ICD-10-CM

## 2019-11-25 DIAGNOSIS — E1159 Type 2 diabetes mellitus with other circulatory complications: Secondary | ICD-10-CM

## 2019-12-11 DIAGNOSIS — E119 Type 2 diabetes mellitus without complications: Secondary | ICD-10-CM | POA: Diagnosis not present

## 2019-12-11 DIAGNOSIS — H52223 Regular astigmatism, bilateral: Secondary | ICD-10-CM | POA: Diagnosis not present

## 2019-12-11 LAB — HM DIABETES EYE EXAM

## 2020-01-15 ENCOUNTER — Ambulatory Visit
Admission: RE | Admit: 2020-01-15 | Discharge: 2020-01-15 | Disposition: A | Discharge status: Home / Self Care | Payer: Medicare Other | Source: Ambulatory Visit | Attending: Family Medicine | Admitting: Family Medicine

## 2020-01-15 ENCOUNTER — Other Ambulatory Visit: Payer: Self-pay

## 2020-01-15 DIAGNOSIS — Z1231 Encounter for screening mammogram for malignant neoplasm of breast: Secondary | ICD-10-CM | POA: Diagnosis not present

## 2020-01-15 DIAGNOSIS — Z13820 Encounter for screening for osteoporosis: Secondary | ICD-10-CM

## 2020-01-15 DIAGNOSIS — Z780 Asymptomatic menopausal state: Secondary | ICD-10-CM | POA: Diagnosis not present

## 2020-01-15 DIAGNOSIS — Z0000 Encounter for general adult medical examination without abnormal findings: Secondary | ICD-10-CM

## 2020-01-15 DIAGNOSIS — Z1239 Encounter for other screening for malignant neoplasm of breast: Secondary | ICD-10-CM

## 2020-01-29 ENCOUNTER — Other Ambulatory Visit: Payer: Self-pay

## 2020-01-29 ENCOUNTER — Other Ambulatory Visit: Payer: Medicare Other

## 2020-01-29 DIAGNOSIS — E782 Mixed hyperlipidemia: Secondary | ICD-10-CM

## 2020-01-29 DIAGNOSIS — E1169 Type 2 diabetes mellitus with other specified complication: Secondary | ICD-10-CM

## 2020-01-29 DIAGNOSIS — E1122 Type 2 diabetes mellitus with diabetic chronic kidney disease: Secondary | ICD-10-CM | POA: Diagnosis not present

## 2020-01-30 LAB — ALT: ALT: 37 IU/L — ABNORMAL HIGH (ref 0–32)

## 2020-01-30 LAB — HEMOGLOBIN A1C
Est. average glucose Bld gHb Est-mCnc: 140 mg/dL
Hgb A1c MFr Bld: 6.5 % — ABNORMAL HIGH (ref 4.8–5.6)

## 2020-01-30 LAB — LIPID PANEL
Chol/HDL Ratio: 3.1 ratio (ref 0.0–4.4)
Cholesterol, Total: 140 mg/dL (ref 100–199)
HDL: 45 mg/dL (ref >39)
LDL Chol Calc (NIH): 67 mg/dL (ref 0–99)
Triglycerides: 168 mg/dL — ABNORMAL HIGH (ref 0–149)
VLDL Cholesterol Cal: 28 mg/dL (ref 5–40)

## 2020-02-05 ENCOUNTER — Encounter: Payer: Self-pay | Admitting: Family Medicine

## 2020-02-05 ENCOUNTER — Ambulatory Visit (INDEPENDENT_AMBULATORY_CARE_PROVIDER_SITE_OTHER): Payer: Medicare Other | Admitting: Family Medicine

## 2020-02-05 ENCOUNTER — Other Ambulatory Visit: Payer: Self-pay

## 2020-02-05 VITALS — BP 107/69 | HR 89 | Temp 98.3°F | Ht 65.0 in | Wt 157.1 lb

## 2020-02-05 DIAGNOSIS — E1159 Type 2 diabetes mellitus with other circulatory complications: Secondary | ICD-10-CM | POA: Diagnosis not present

## 2020-02-05 DIAGNOSIS — I10 Essential (primary) hypertension: Secondary | ICD-10-CM

## 2020-02-05 DIAGNOSIS — N183 Chronic kidney disease, stage 3 unspecified: Secondary | ICD-10-CM | POA: Insufficient documentation

## 2020-02-05 DIAGNOSIS — E782 Mixed hyperlipidemia: Secondary | ICD-10-CM | POA: Diagnosis not present

## 2020-02-05 DIAGNOSIS — E1169 Type 2 diabetes mellitus with other specified complication: Secondary | ICD-10-CM | POA: Diagnosis not present

## 2020-02-05 DIAGNOSIS — E119 Type 2 diabetes mellitus without complications: Secondary | ICD-10-CM | POA: Insufficient documentation

## 2020-02-05 DIAGNOSIS — I152 Hypertension secondary to endocrine disorders: Secondary | ICD-10-CM

## 2020-02-05 DIAGNOSIS — E1122 Type 2 diabetes mellitus with diabetic chronic kidney disease: Secondary | ICD-10-CM | POA: Insufficient documentation

## 2020-02-05 NOTE — Progress Notes (Signed)
Impression and Recommendations:    1. Type 2 diabetes mellitus with other specified complication, without long-term current use of insulin ([place])   2. Mixed diabetic hyperlipidemia associated with type 2 diabetes mellitus ([place])   3. Hypertension associated with diabetes [facility])      - Last seen October 2020.  - Reviewed recent lab work (01/29/2020) in depth with patient today.  All lab work within normal limits unless otherwise noted.  Extensive education provided and all questions answered.  ALT - Stable last check. - 37, stable from 37 three months prior. - Will continue to monitor and re-check as discussed.  Type 2 DM - A1c most recently is 6.5, at goal, stable from 6.3 prior.  - Pt will continue current treatment regimen   - Counseled patient on pathophysiology of disease and discussed various treatment options, which always includes dietary and lifestyle modification as first line.    - Importance of low carb, heart-healthy diet discussed with patient in addition to regular aerobic exercise of 37mn 5d/week or more.   - Check FBS and 2 hours after the biggest meal of your day.  Keep log and bring in next OV for my review.   - Also told patient if you ever feel poorly, please check your blood pressure and blood sugar, as one or the other could be the cause of your symptoms.  - Pt reminded about need for yearly eye and foot exams.  Told patient to make appt.for diabetic eye exam, CMAs here will do foot exams  - We will continue to monitor and re-check as discussed.   Hyperlipidemia associated with DM - Last OV, increased dose of statin to 40 from 20 prior.  - Cholesterol levels are stable, at goal, improved from prior. Last check 7 days ago: Triglycerides = 168, elevated, down from 208 prior. HDL = 45, WNL, stable from 47 prior. LDL = 67, down from 125. - ALT = WNL's  - Pt will continue current treatment regimen.  See med list.  - Dietary changes such as low  saturated & trans fat diets for hyperlipidemia and low carb diets for hypertriglyceridemia discussed with patient.    - Encouraged patient to follow AHA guidelines for regular exercise and also engage in weight loss if BMI above 25.   - We will continue to monitor.   Hypertension Associated with DM - Blood pressure currently is at goal at home, per patient's BP log. - Patient denies dizziness or lightheadedness or other symptoms of low BP.  - Patient will continue current treatment regimen.  See med list. - Patient knows to monitor for symptomatic low blood pressure.  - Counseled patient on pathophysiology of disease and discussed various treatment options, which always includes dietary and lifestyle modification as first line.   - Lifestyle changes such as dash and heart healthy diets and engaging in a regular exercise program discussed extensively with patient.   - Ambulatory blood pressure monitoring encouraged at least 3 times weekly.  Keep log and bring in every office visit.  Reminded patient that if they ever feel poorly in any way, to check their blood pressure and pulse.  - We will continue to monitor.   COVID-19 Counseling - COVID-19 counseling performed and all questions answered. - Encouraged patient to obtain COVID-19 vaccination as scheduled near future. - Patient knows to call with any questions or concerns PRN.   Lifestyle & Preventative Health Maintenance - Advised patient to continue working toward exercising  to improve overall mental, physical, and emotional health.    - Reviewed the "spokes of the wheel" of mood and health management.  Stressed the importance of ongoing prudent habits, including regular exercise, appropriate sleep hygiene, healthful dietary habits, and prayer/meditation to relax.  - Encouraged patient to engage in daily physical activity as tolerated, especially a formal exercise routine.  Recommended that the patient eventually strive for at  least 150 minutes of moderate cardiovascular activity per week according to guidelines established by the [facility].   - Healthy dietary habits encouraged, including low-carb, and high amounts of lean protein in diet.   - Patient should also consume adequate amounts of water.  - Health counseling performed.  All questions answered.  Gross side effects, risk and benefits, and alternatives of medications and treatment plan in general discussed with patient.  Patient is aware that all medications have potential side effects and we are unable to predict every side effect or drug-drug interaction that may occur.   Patient will call with any questions prior to using medication if they have concerns.    Expresses verbal understanding and consents to current therapy and treatment regimen.  No barriers to understanding were identified.  Red flag symptoms and signs discussed in detail.  Patient expressed understanding regarding what to do in case of emergency\urgent symptoms  Please see AVS handed out to patient at the end of our visit for further patient instructions/ counseling done pertaining to today's office visit.  Return for f/up 4 months DM, HTN, HLD.  Note:  This note was prepared with assistance of Dragon voice recognition software. Occasional wrong-word or sound-a-like substitutions may have occurred due to the inherent limitations of voice recognition software.  This document serves as a record of services personally performed by Mellody Dance, DO. It was created on her behalf by Toni Amend, a trained medical scribe. The creation of this record is based on the scribe's personal observations and the provider's statements to them.   This case required medical decision making of at least moderate complexity.  This case required medical decision making of at least moderate complexity. The above documentation from Toni Amend, medical scribe, has been reviewed by Marjory Sneddon,  D.O.   --------------------------------------------------------------------------------------------------------------------------------------------------------------------------------------------------------------------------------------- Subjective:    Jill Daugherty, am serving as scribe for Dr.Asaph Serena.   HPI: Jill Daugherty is a 74 y.o. female who presents to [place] at [facility] today for issues as discussed below.  Today "I'm doing great; I've been safe and we're getting our first shots Saturday at the [facility]."  She and her husband signed up online through [facility].  States she does try to watch what she eats, and eats very little.  Says "I've got to eat a little meat."  She eats meat every day.  Notes she's slacked on her exercise a little bit.  She and her husband typically go to [employer] to walk, and plans on walking outdoors once it gets warmer again.  After having a wart removed recently, was told by dermatology to start taking Zinc, to help improve her immune system.  She takes the Zinc at night with her Vitamin D and cholesterol medicine.   HPI:   Diabetes Mellitus:  Home glucose readings:  "Been doing good."  Notes one morning she checked it and it was 145, and she tried to think what she did wrong and knows to watch what she eats.   - Patient reports good compliance with therapy plan:  medication and/or lifestyle modification  - Her denies acute concerns or problems related to treatment plan  - She denies new concerns.  Denies polyuria/polydipsia, hypo/ hyperglycemia symptoms.  Denies new onset of: chest pain, exercise intolerance, shortness of breath, dizziness, visual changes, headache, lower extremity swelling or claudication.   Last A1C in the office was:  Lab Results  Component Value Date   HGBA1C 6.5 (H) 01/29/2020   HGBA1C 6.3 (H) 10/09/2019   HGBA1C 6.7 (A) 01/16/2019   Lab Results  Component Value Date   MICROALBUR  10 02/28/2019   LDLCALC 67 01/29/2020   CREATININE 0.93 10/09/2019   BP Readings from Last 3 Encounters:  02/05/20 107/69  10/16/19 119/82  09/20/19 117/63   Wt Readings from Last 3 Encounters:  02/05/20 157 lb 1.6 oz (71.3 kg)  10/16/19 157 lb 9.6 oz (71.5 kg)  09/20/19 161 lb (73 kg)    HPI:  Hyperlipidemia:  74 y.o. female here for cholesterol follow-up.   - Patient reports good compliance with treatment plan of:  medication and/ or lifestyle management.    Notes doing well on her increased dose of statin, and trying to drink more water than prior.  - Patient denies any acute concerns or problems with management plan   - She denies new onset of: myalgias, arthralgias, increased fatigue more than normal, chest pains, exercise intolerance, shortness of breath, dizziness, visual changes, headache, lower extremity swelling or claudication.   Most recent cholesterol panel was:  Lab Results  Component Value Date   CHOL 140 01/29/2020   HDL 45 01/29/2020   LDLCALC 67 01/29/2020   LDLDIRECT 94.0 12/23/2015   TRIG 168 (H) 01/29/2020   CHOLHDL 3.1 01/29/2020   Hepatic Function Latest Ref Rng & Units 01/29/2020 10/09/2019 05/09/2018  Total Protein 6.0 - 8.5 g/dL - 7.3 6.6  Albumin 3.7 - 4.7 g/dL - 5.0(H) 4.5  AST 0 - 40 IU/L - 26 21  ALT 0 - 32 IU/L 37(H) 37(H) 28  Alk Phosphatase 39 - 117 IU/L - 74 64  Total Bilirubin 0.0 - 1.2 mg/dL - 0.5 0.5    HPI:  Hypertension:  -  Her blood pressure at home has been running: 119/60, pulse 76; 118/67, pulse of 98; 112/60, pulse of 82; 125/63 pulse 76; 124/62, pulse 68; 121/61 pulse 73; 105/57 pulse 72; 111/60, pulse 68.  Denies dizziness or lightheadedness.  - Patient reports good compliance with medication and/or lifestyle modification  - Her denies acute concerns or problems related to treatment plan  - She denies new onset of: chest pain, exercise intolerance, shortness of breath, dizziness, visual changes, headache, lower  extremity swelling or claudication.   Last 3 blood pressure readings in our office are as follows: BP Readings from Last 3 Encounters:  02/05/20 107/69  10/16/19 119/82  09/20/19 117/63   Filed Weights   02/05/20 0936  Weight: 157 lb 1.6 oz (71.3 kg)     Wt Readings from Last 3 Encounters:  02/05/20 157 lb 1.6 oz (71.3 kg)  10/16/19 157 lb 9.6 oz (71.5 kg)  09/20/19 161 lb (73 kg)   BP Readings from Last 3 Encounters:  02/05/20 107/69  10/16/19 119/82  09/20/19 117/63   Pulse Readings from Last 3 Encounters:  02/05/20 89  10/16/19 77  09/20/19 77   BMI Readings from Last 3 Encounters:  02/05/20 26.14 kg/m  10/16/19 26.23 kg/m  09/20/19 26.79 kg/m     Patient Care Team    Relationship Specialty Notifications Start  End  Mellody Dance, DO PCP - General Family Medicine  12/09/17   Druscilla Brownie, MD Consulting Physician Dermatology  01/11/18   Webb Laws, Owen Referring Physician Optometry  01/11/18   Celedonio Savage, PA-C Consulting Physician Dermatology  01/11/18    Comment: derm      Patient Active Problem List   Diagnosis Date Noted  . Type 2 diabetes mellitus with diabetic chronic kidney disease ([place]) 05/11/2018  . Hypertension associated with diabetes ([place]) 01/11/2018  . Mixed diabetic hyperlipidemia associated with type 2 diabetes mellitus ([place]) 01/11/2018  . DM2 (diabetes mellitus, type 2) ([place]) 06/23/2015  . Gout 06/23/2015  . GERD (gastroesophageal reflux disease) 06/23/2015  . Hypothyroidism 06/21/2013  . Family history of early CAD- dad in early 60's 02/07/2018  . Family history of cerebrovascular accident (CVA) in mother- in her 54's 02/07/2018  . Family history of diabetes mellitus (DM)- PGM- in 40's 02/07/2018  . Family history of Alzheimer's disease- Mom in her 28 02/07/2018  . Environmental and seasonal allergies 01/11/2018  . Vitamin D deficiency 01/11/2018  . Type 2 diabetes mellitus with stage 3 chronic kidney disease ([place])  02/05/2020  . Diabetes mellitus ([place]) 02/05/2020  . Adjustment disorder with mixed anxiety and depressed mood 02/28/2019  . Urinary incontinence in female 11/07/2018  . Gastroesophageal reflux disease 11/07/2018  . Irritable bowel syndrome with both constipation and diarrhea 09/12/2018  . Tinea corporis- lower abd folds 09/12/2018  . Acute maxillary sinusitis 04/10/2018  . Acute otitis media 04/10/2018  . Cough in adult 04/10/2018  . Noncompliance with diet and medication regimen 03/20/2018  . OAB (overactive bladder) 06/23/2015  . HTN (hypertension) 06/21/2013  . Hyperlipidemia 06/21/2013    Past Medical history, Surgical history, Family history, Social history, Allergies and Medications have been entered into the medical record, reviewed and changed as needed.    Current Meds  Medication Sig  . allopurinol (ZYLOPRIM) 100 MG tablet Take 1 tablet by mouth once daily  . amLODipine (NORVASC) 5 MG tablet Take 1 tablet by mouth once daily  . aspirin EC 81 MG tablet Take 81 mg by mouth daily.  Marland Kitchen atorvastatin (LIPITOR) 40 MG tablet Take 1 tablet (40 mg total) by mouth at bedtime.  . Calcium Carbonate (CALCIUM 600 PO) Take by mouth.  . Cholecalciferol (VITAMIN D3) 1000 units CAPS Take 2 capsules by mouth.  . famotidine (PEPCID) 20 MG tablet Take 1 tablet (20 mg total) by mouth 2 (two) times daily.  . hydrochlorothiazide (HYDRODIURIL) 12.5 MG tablet Take 1 tablet (12.5 mg total) by mouth daily.  Marland Kitchen levothyroxine (SYNTHROID) 50 MCG tablet Take 1 tablet (50 mcg total) by mouth daily.  Marland Kitchen loratadine (CLARITIN) 10 MG tablet Take 1 tablet (10 mg total) by mouth daily.  Marland Kitchen losartan (COZAAR) 50 MG tablet Take 1 tablet (50 mg total) by mouth daily.  . metFORMIN (GLUCOPHAGE-XR) 500 MG 24 hr tablet Take 1 tablet (500 mg total) by mouth daily.  . Miconazole Nitrate 2 % POWD Apply to affected folds of skin twice daily only when red\ irritated  . omeprazole (PRILOSEC) 20 MG capsule Take 1 capsule (20 mg  total) by mouth daily. As needed for Reflux/Heartburn  . sertraline (ZOLOFT) 25 MG tablet Take 1 tablet (25 mg total) by mouth daily.  Marland Kitchen tolterodine (DETROL LA) 2 MG 24 hr capsule Take 1 capsule (2 mg total) by mouth daily.    Allergies:  No Known Allergies   Review of Systems:  A fourteen system review of systems  was performed and found to be positive as per HPI.   Objective:   Blood pressure 107/69, pulse 89, temperature 98.3 F (36.8 C), temperature source Oral, height 5' 5" (1.651 m), weight 157 lb 1.6 oz (71.3 kg), SpO2 98 %. Body mass index is 26.14 kg/m. General:  Well Developed, well nourished, appropriate for stated age.  Neuro:  Alert and oriented,  extra-ocular muscles intact  HEENT:  Normocephalic, atraumatic, neck supple, no carotid bruits appreciated  Skin:  no gross rash, warm, pink. Cardiac:  RRR, S1 S2 Respiratory:  ECTA B/L and A/P, Not using accessory muscles, speaking in full sentences- unlabored. Vascular:  Ext warm, no cyanosis apprec.; cap RF less 2 sec. Psych:  No HI/SI, judgement and insight good, Euthymic mood. Full Affect.

## 2020-02-05 NOTE — Patient Instructions (Signed)
   Guidelines for a Low Cholesterol, Low Saturated Fat Diet   Fats - Limit total intake of fats and oils. - Avoid butter, stick margarine, shortening, lard, palm and coconut oils. - Limit mayonnaise, salad dressings, gravies and sauces, unless they are homemade with low-fat ingredients. - Limit chocolate. - Choose low-fat and nonfat products, such as low-fat mayonnaise, low-fat or non-hydrogenated peanut butter, low-fat or fat-free salad dressings and nonfat gravy. - Use vegetable oil, such as canola or olive oil. - Look for margarine that does not contain trans fatty acids. - Use nuts in moderate amounts. - Read ingredient labels carefully to determine both amount and type of fat present in foods. Limit saturated and trans fats! - Avoid high-fat processed and convenience foods.  Meats and Meat Alternatives - Choose fish, chicken, turkey and lean meats. - Use dried beans, peas, lentils and tofu. - Limit egg yolks to three to four per week. - If you eat red meat, limit to no more than three servings per week and choose loin or round cuts. - Avoid fatty meats, such as bacon, sausage, franks, luncheon meats and ribs. - Avoid all organ meats, including liver.  Dairy - Choose nonfat or low-fat milk, yogurt and cottage cheese. - Most cheeses are high in fat. Choose cheeses made from non-fat milk, such as mozzarella and ricotta cheese. - Choose light or fat-free cream cheese and sour cream. - Avoid cream and sauces made with cream.  Fruits and Vegetables - Eat a wide variety of fruits and vegetables. - Use lemon juice, vinegar or "mist" olive oil on vegetables. - Avoid adding sauces, fat or oil to vegetables.  Breads, Cereals and Grains - Choose whole-grain breads, cereals, pastas and rice. - Avoid high-fat snack foods, such as granola, cookies, pies, pastries, doughnuts and croissants.  Cooking Tips - Avoid deep fried foods. - Trim visible fat off meats and remove skin from poultry  before cooking. - Bake, broil, boil, poach or roast poultry, fish and lean meats. - Drain and discard fat that drains out of meat as you cook it. - Add little or no fat to foods. - Use vegetable oil sprays to grease pans for cooking or baking. - Steam vegetables. - Use herbs or no-oil marinades to flavor foods.  

## 2020-03-17 ENCOUNTER — Other Ambulatory Visit: Payer: Self-pay | Admitting: Family Medicine

## 2020-03-17 DIAGNOSIS — K219 Gastro-esophageal reflux disease without esophagitis: Secondary | ICD-10-CM

## 2020-03-17 DIAGNOSIS — K582 Mixed irritable bowel syndrome: Secondary | ICD-10-CM

## 2020-03-17 DIAGNOSIS — E039 Hypothyroidism, unspecified: Secondary | ICD-10-CM

## 2020-03-17 DIAGNOSIS — E1169 Type 2 diabetes mellitus with other specified complication: Secondary | ICD-10-CM

## 2020-03-17 DIAGNOSIS — I152 Hypertension secondary to endocrine disorders: Secondary | ICD-10-CM

## 2020-03-17 DIAGNOSIS — E1122 Type 2 diabetes mellitus with diabetic chronic kidney disease: Secondary | ICD-10-CM

## 2020-03-17 DIAGNOSIS — N183 Type 2 diabetes mellitus with diabetic chronic kidney disease: Secondary | ICD-10-CM

## 2020-03-17 DIAGNOSIS — E1159 Type 2 diabetes mellitus with other circulatory complications: Secondary | ICD-10-CM

## 2020-04-13 ENCOUNTER — Other Ambulatory Visit: Payer: Self-pay | Admitting: Family Medicine

## 2020-04-13 DIAGNOSIS — E1169 Type 2 diabetes mellitus with other specified complication: Secondary | ICD-10-CM

## 2020-04-23 IMAGING — MG DIGITAL SCREENING BILAT W/ TOMO W/ CAD
8 series · 8 of 24 positions shown · non-contrast
Comparison: Previous exam(s).

CLINICAL DATA: Screening.

EXAM:
DIGITAL SCREENING BILATERAL MAMMOGRAM WITH TOMO AND CAD

[L CC synth-2D]
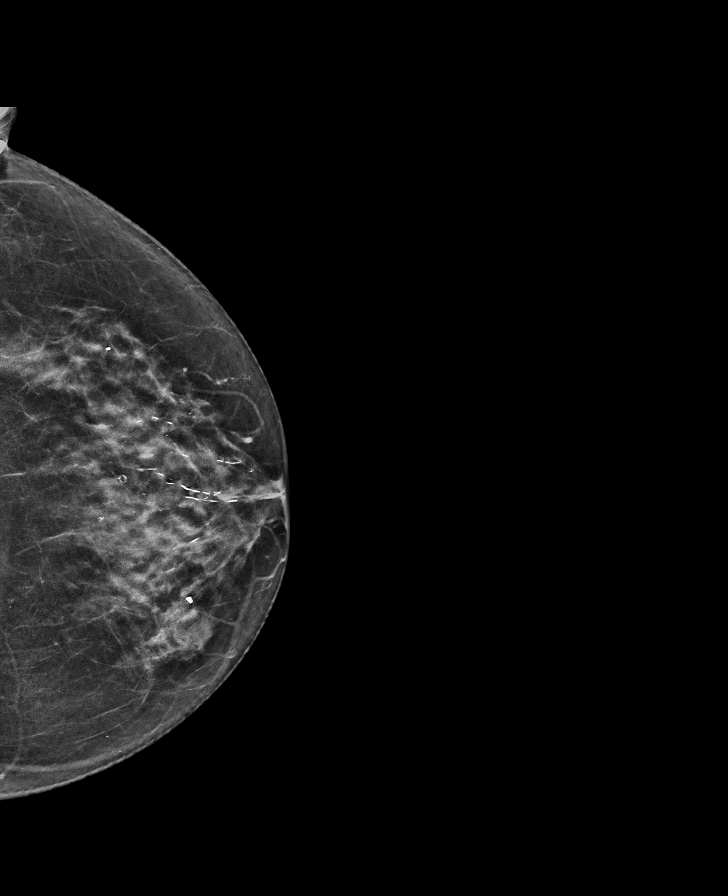

[R MLO synth-2D]
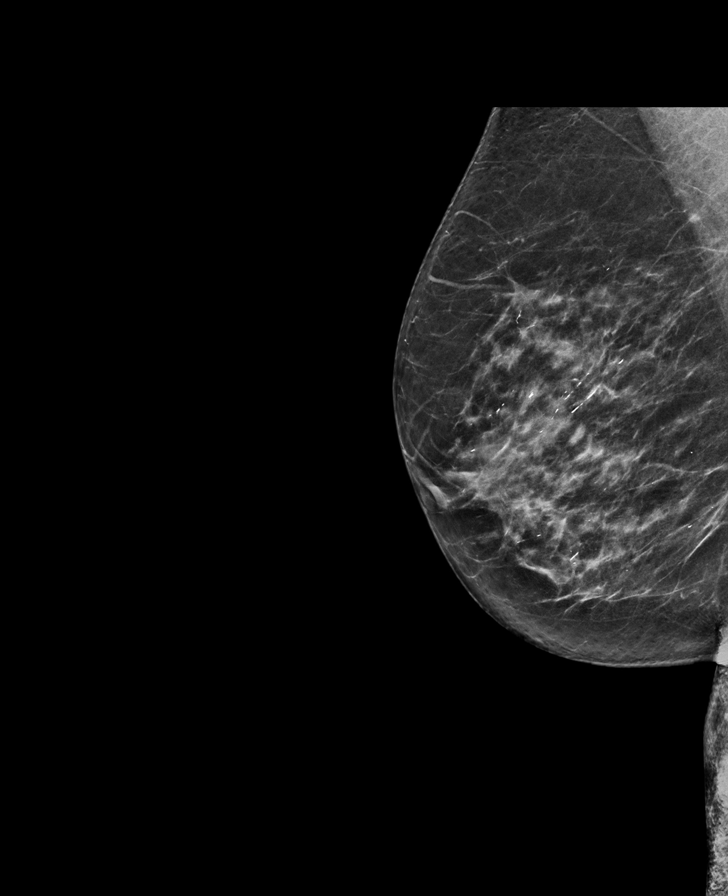

[R CC synth-2D]
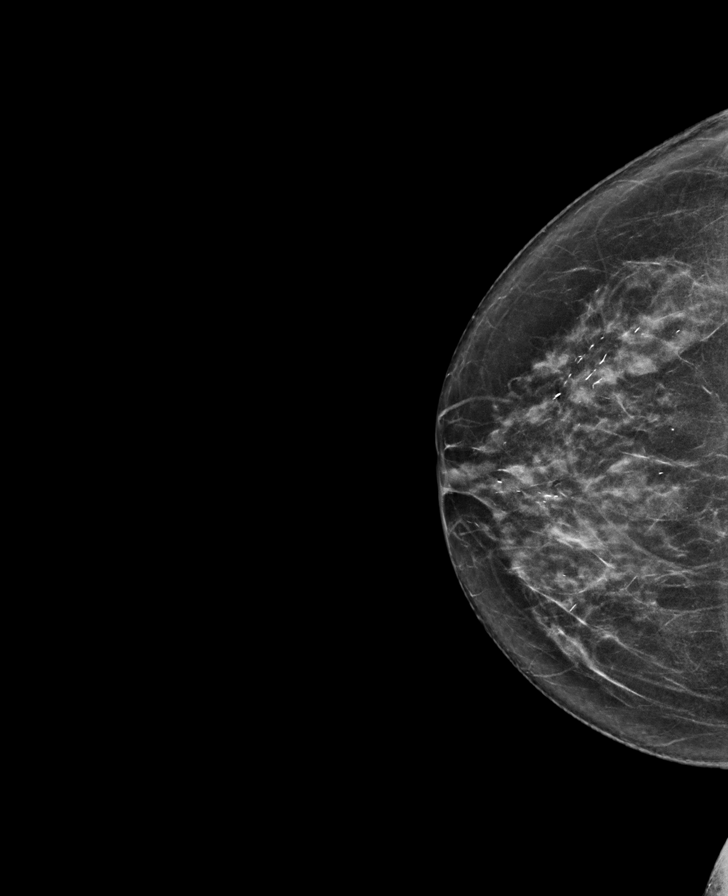

[L MLO synth-2D]
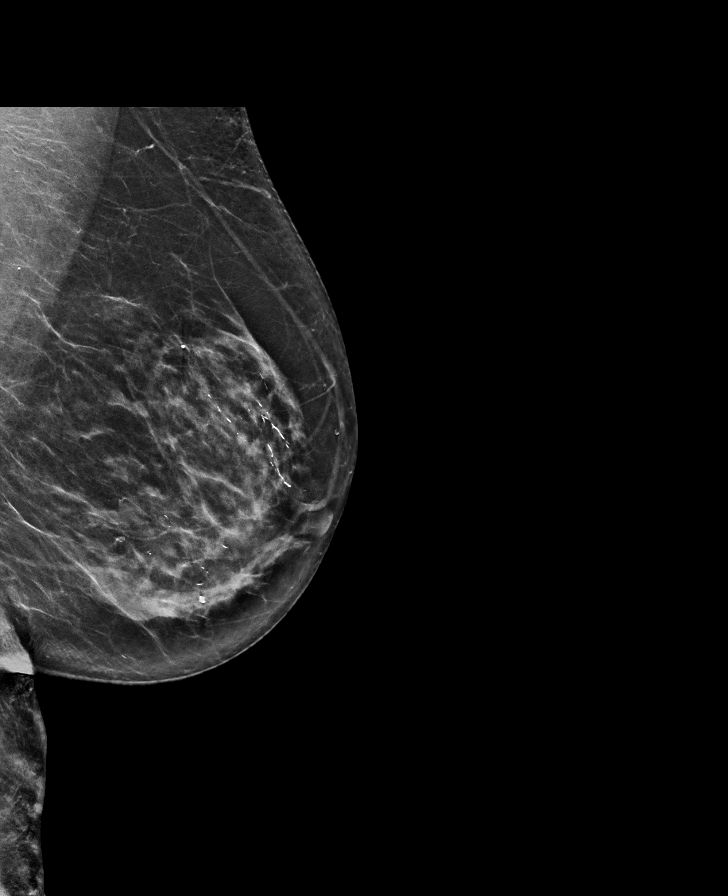

[R CC tomo · tomo slice 35/70.0]
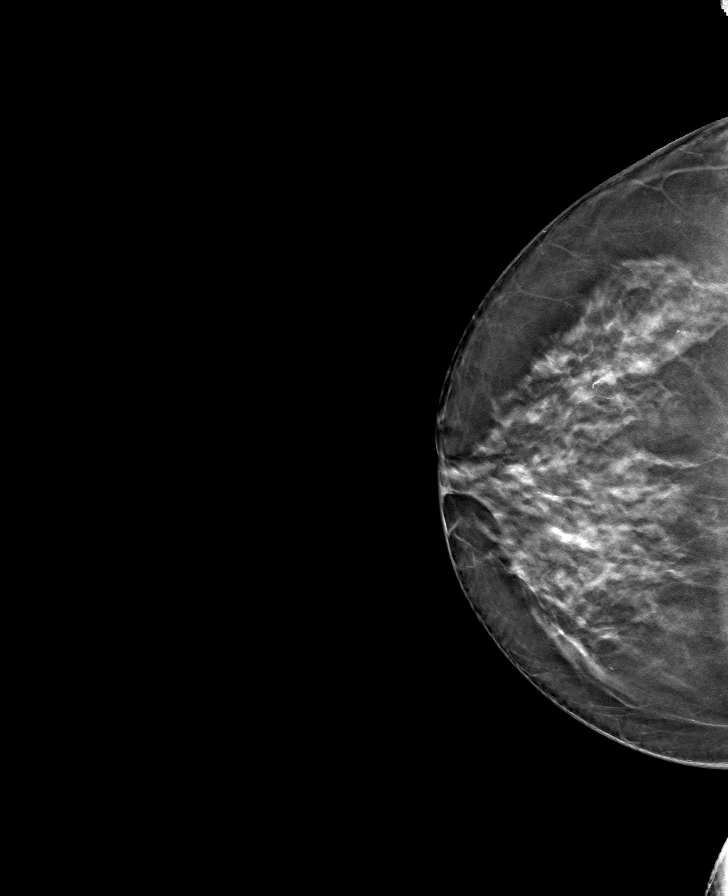

[L CC tomo · tomo slice 33/65.0]
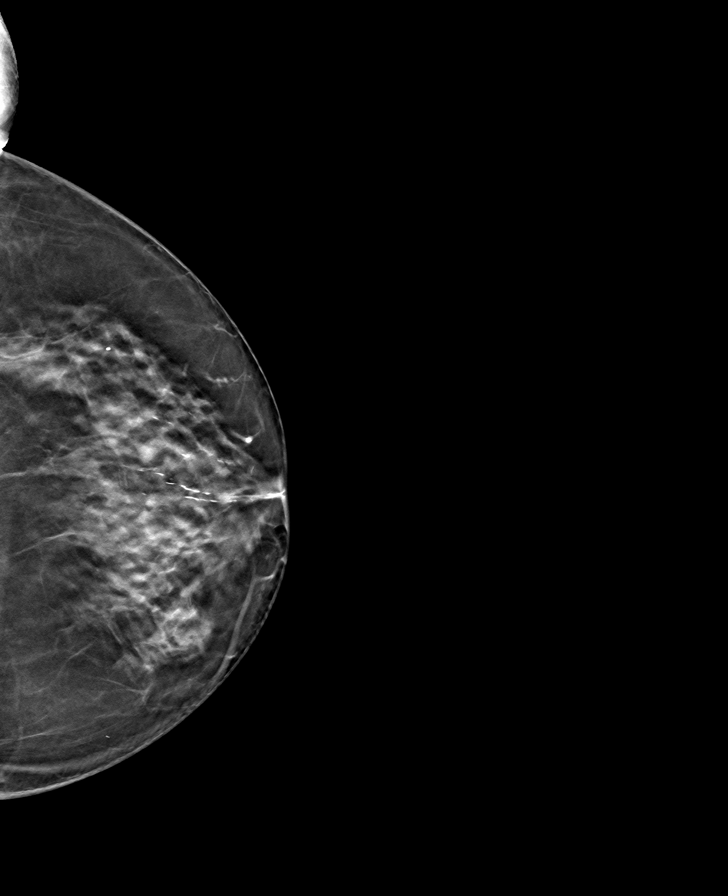

[R MLO tomo · tomo slice 35/69.0]
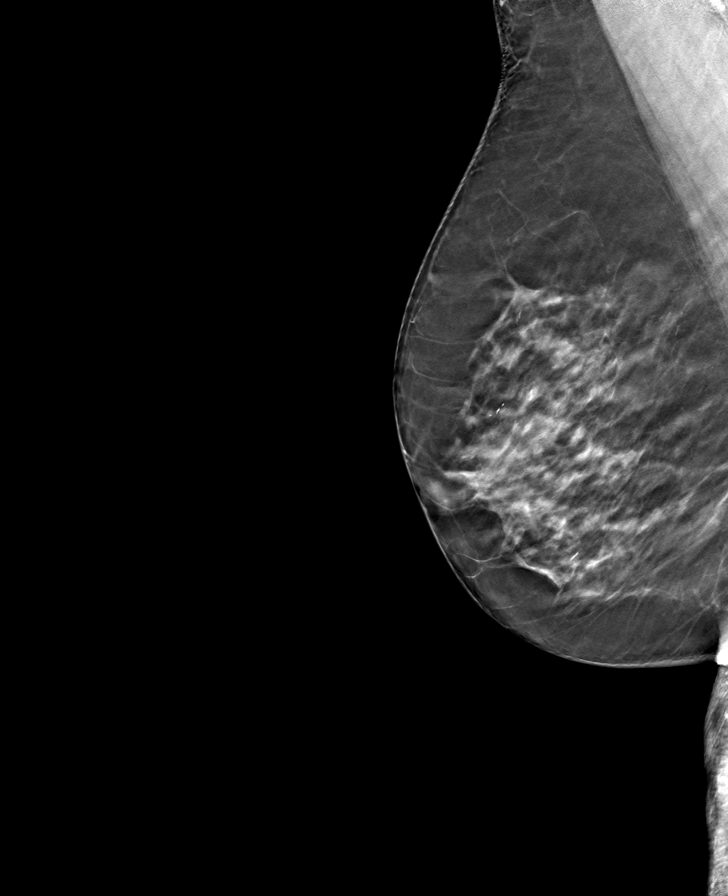

[L MLO tomo · tomo slice 35/70.0]
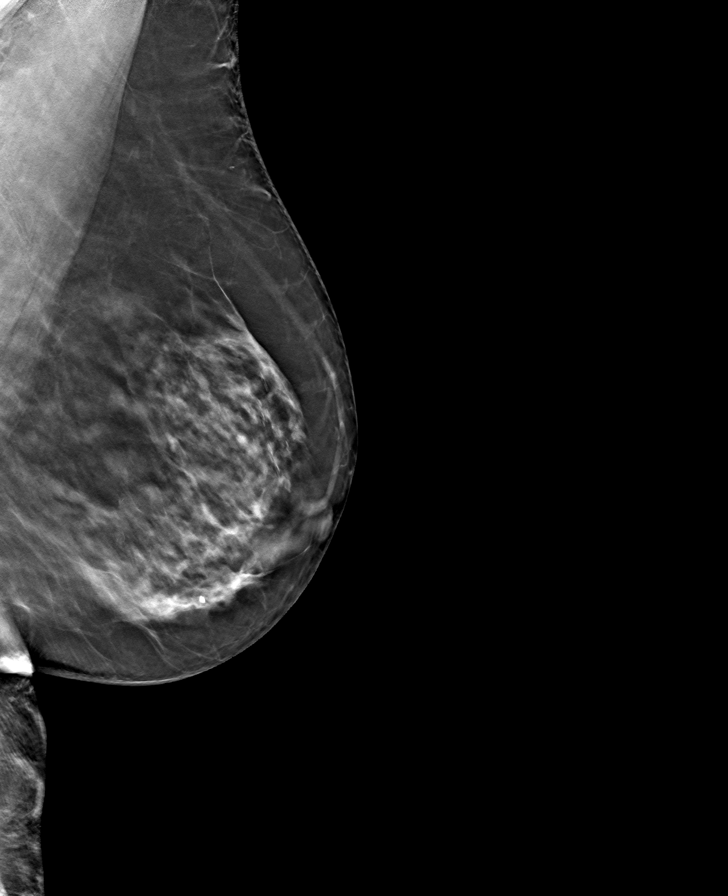

[8 of 24 positions shown; findings below may reference images not displayed]

ACR Breast Density Category c: The breast tissue is heterogeneously
dense, which may obscure small masses.
FINDINGS: There are no findings suspicious for malignancy. Images were
processed with CAD.
IMPRESSION: No mammographic evidence of malignancy. A result letter of this
screening mammogram will be mailed directly to the patient.

RECOMMENDATION:
Screening mammogram in one year. (Code:FT-U-LHB)

BI-RADS CATEGORY  1: Negative.

## 2020-06-03 ENCOUNTER — Other Ambulatory Visit: Payer: Self-pay

## 2020-06-03 ENCOUNTER — Encounter: Payer: Self-pay | Admitting: Physician Assistant

## 2020-06-03 ENCOUNTER — Ambulatory Visit (INDEPENDENT_AMBULATORY_CARE_PROVIDER_SITE_OTHER): Payer: Medicare Other | Admitting: Physician Assistant

## 2020-06-03 ENCOUNTER — Other Ambulatory Visit: Payer: Self-pay | Admitting: Family Medicine

## 2020-06-03 VITALS — BP 124/67 | HR 87 | Temp 97.7°F | Ht 65.0 in | Wt 156.6 lb

## 2020-06-03 DIAGNOSIS — I10 Essential (primary) hypertension: Secondary | ICD-10-CM

## 2020-06-03 DIAGNOSIS — E1159 Type 2 diabetes mellitus with other circulatory complications: Secondary | ICD-10-CM

## 2020-06-03 DIAGNOSIS — N183 Chronic kidney disease, stage 3 unspecified: Secondary | ICD-10-CM | POA: Diagnosis not present

## 2020-06-03 DIAGNOSIS — E782 Mixed hyperlipidemia: Secondary | ICD-10-CM | POA: Diagnosis not present

## 2020-06-03 DIAGNOSIS — E039 Hypothyroidism, unspecified: Secondary | ICD-10-CM

## 2020-06-03 DIAGNOSIS — I152 Hypertension secondary to endocrine disorders: Secondary | ICD-10-CM

## 2020-06-03 DIAGNOSIS — E1169 Type 2 diabetes mellitus with other specified complication: Secondary | ICD-10-CM | POA: Diagnosis not present

## 2020-06-03 DIAGNOSIS — K582 Mixed irritable bowel syndrome: Secondary | ICD-10-CM

## 2020-06-03 DIAGNOSIS — E1122 Type 2 diabetes mellitus with diabetic chronic kidney disease: Secondary | ICD-10-CM | POA: Diagnosis not present

## 2020-06-03 DIAGNOSIS — Z79899 Other long term (current) drug therapy: Secondary | ICD-10-CM | POA: Diagnosis not present

## 2020-06-03 LAB — POCT GLYCOSYLATED HEMOGLOBIN (HGB A1C): Hemoglobin A1C: 6.6 % — AB (ref 4.0–5.6)

## 2020-06-03 NOTE — Progress Notes (Signed)
Established Patient Office Visit  Subjective:  Patient ID: Jill Daugherty, female    DOB: 1946-01-14  Age: 74 y.o. MRN: [id number]  CC:  Chief Complaint  Patient presents with  . Hypertension  . Diabetes  . Hyperlipidemia    HPI Jill Daugherty presents for 4 month follow-up on diabetes mellitus, hypertension, hyperlipidemia and hypothyroid.  Diabetes: Pt denies increased urination or thirst. Pt reports medication compliance. No hypoglycemic events. Checking glucose at home. FBS range 120-130s. States she has been watching what she eats and reduced sweets. She is doing lots of walking.  HTN: Pt denies chest pain, palpitations, dizziness or lower extremity swelling. Taking medication as directed without side effects. Pt follows a low salt diet.  HLD: Pt taking medication as directed without issues. Denies side effects including myalgias and RUQ pain. She has decreased red meats and bacon.   Hypothyroid: Asymptomatic. Reports medication compliance.   Past Medical History:  Diagnosis Date  . History of chicken pox   . History of frequent urinary tract infections   . Hyperlipidemia   . Hypertension   . Overactive bladder   . Thyroid disease     History reviewed. No pertinent surgical history.  Family History  Problem Relation Age of Onset  . Heart disease Father   . Diabetes Paternal Grandmother   . Cancer Paternal Grandmother   . Breast cancer Neg Hx     Social History   Socioeconomic History  . Marital status: Married    Spouse name: Not on file  . Number of children: 1  . Years of education: 71  . Highest education level: Not on file  Occupational History  . Occupation: Retired  Tobacco Use  . Smoking status: Never Smoker  . Smokeless tobacco: Never Used  Vaping Use  . Vaping Use: Never used  Substance and Sexual Activity  . Alcohol use: No    Alcohol/week: 0.0 standard drinks  . Drug use: No  . Sexual activity: Not Currently  Other  Topics Concern  . Not on file  Social History Narrative   Regular exercise-no   Caffeine Use-yes   Social Determinants of Health   Financial Resource Strain:   . Difficulty of Paying Living Expenses:   Food Insecurity:   . Worried About Charity fundraiser in the Last Year:   . Arboriculturist in the Last Year:   Transportation Needs:   . Film/video editor (Medical):   Marland Kitchen Lack of Transportation (Non-Medical):   Physical Activity:   . Days of Exercise per Week:   . Minutes of Exercise per Session:   Stress:   . Feeling of Stress :   Social Connections:   . Frequency of Communication with Friends and Family:   . Frequency of Social Gatherings with Friends and Family:   . Attends Religious Services:   . Active Member of Clubs or Organizations:   . Attends Archivist Meetings:   Marland Kitchen Marital Status:   Intimate Partner Violence:   . Fear of Current or Ex-Partner:   . Emotionally Abused:   Marland Kitchen Physically Abused:   . Sexually Abused:     Outpatient Medications Prior to Visit  Medication Sig Dispense Refill  . aspirin EC 81 MG tablet Take 81 mg by mouth daily.    Marland Kitchen atorvastatin (LIPITOR) 40 MG tablet TAKE 1 TABLET BY MOUTH AT BEDTIME 90 tablet 0  . Calcium Carbonate (CALCIUM 600 PO) Take by mouth.    Marland Kitchen  Cholecalciferol (VITAMIN D3) 1000 units CAPS Take 2 capsules by mouth.    . famotidine (PEPCID) 20 MG tablet Take 1 tablet (20 mg total) by mouth 2 (two) times daily. 180 tablet 1  . fluticasone (FLONASE) 50 MCG/ACT nasal spray USE 1 SPRAY(S) IN EACH NOSTRIL TWICE DAILY FOR 14 DAYS 16 g 0  . loratadine (CLARITIN) 10 MG tablet Take 1 tablet (10 mg total) by mouth daily. 90 tablet 3  . metFORMIN (GLUCOPHAGE-XR) 500 MG 24 hr tablet Take 1 tablet by mouth once daily 90 tablet 0  . Miconazole Nitrate 2 % POWD Apply to affected folds of skin twice daily only when red\ irritated 500 g 0  . omeprazole (PRILOSEC) 20 MG capsule TAKE 1 CAPSULE BY MOUTH ONCE DAILY AS NEEDED FOR  REFLUX/HEARTBURN 90 capsule 0  . tolterodine (DETROL LA) 2 MG 24 hr capsule Take 1 capsule (2 mg total) by mouth daily. 90 capsule 0  . allopurinol (ZYLOPRIM) 100 MG tablet Take 1 tablet by mouth once daily 90 tablet 0  . amLODipine (NORVASC) 5 MG tablet Take 1 tablet by mouth once daily 90 tablet 0  . hydrochlorothiazide (HYDRODIURIL) 12.5 MG tablet Take 1 tablet by mouth once daily 90 tablet 0  . levothyroxine (SYNTHROID) 50 MCG tablet Take 1 tablet by mouth once daily 90 tablet 0  . losartan (COZAAR) 50 MG tablet Take 1 tablet by mouth once daily 90 tablet 0  . sertraline (ZOLOFT) 25 MG tablet Take 1 tablet by mouth once daily 90 tablet 0   No facility-administered medications prior to visit.    No Known Allergies  ROS Review of Systems  A fourteen system review of systems was performed and found to be positive as per HPI.    Objective:    Physical Exam General:  Well Developed, well nourished, appropriate for stated age.  Neuro:  Alert and oriented,  extra-ocular muscles intact  HEENT:  Normocephalic, atraumatic, neck supple, no carotid bruits appreciated  Skin:  no gross rash, warm, pink. Cardiac:  RRR, S1 S2 Respiratory:  ECTA B/L and A/P, Not using accessory muscles, speaking in full sentences- unlabored. MSK: Good ROM, normal gait Vascular:  Ext warm, no cyanosis apprec.; no edema present Psych:  No HI/SI, judgement and insight good, Euthymic mood. Full Affect.   BP 124/67   Pulse 87   Temp 97.7 F (36.5 C) (Oral)   Ht 5\' 5"  (1.651 m)   Wt 156 lb 9.6 oz (71 kg)   SpO2 99%   BMI 26.06 kg/m  Wt Readings from Last 3 Encounters:  06/03/20 156 lb 9.6 oz (71 kg)  02/05/20 157 lb 1.6 oz (71.3 kg)  10/16/19 157 lb 9.6 oz (71.5 kg)     Health Maintenance Due  Topic Date Due  . Hepatitis C Screening  Never done  . COVID-19 Vaccine (1) Never done  . TETANUS/TDAP  Never done  . COLONOSCOPY  Never done  . PNA vac Low Risk Adult (1 of 2 - PCV13) Never done     There are no preventive care reminders to display for this patient.  Lab Results  Component Value Date   TSH 1.810 10/09/2019   Lab Results  Component Value Date   WBC 5.3 10/09/2019   HGB 14.8 10/09/2019   HCT 43.9 10/09/2019   MCV 92 10/09/2019   PLT 236 10/09/2019   Lab Results  Component Value Date   NA 142 10/09/2019   K 4.4 10/09/2019   CO2 24  10/09/2019   GLUCOSE 131 (H) 10/09/2019   BUN 16 10/09/2019   CREATININE 0.93 10/09/2019   BILITOT 0.5 10/09/2019   ALKPHOS 74 10/09/2019   AST 26 10/09/2019   ALT 35 (H) 06/03/2020   PROT 7.3 10/09/2019   ALBUMIN 5.0 (H) 10/09/2019   CALCIUM 9.8 10/09/2019   GFR 57.65 (L) 12/23/2015   Lab Results  Component Value Date   CHOL 140 01/29/2020   Lab Results  Component Value Date   HDL 45 01/29/2020   Lab Results  Component Value Date   LDLCALC 67 01/29/2020   Lab Results  Component Value Date   TRIG 168 (H) 01/29/2020   Lab Results  Component Value Date   CHOLHDL 3.1 01/29/2020   Lab Results  Component Value Date   HGBA1C 6.6 (A) 06/03/2020      Assessment & Plan:   Problem List Items Addressed This Visit      Cardiovascular and Mediastinum   Hypertension associated with diabetes ([place])     Endocrine   Hypothyroidism   Mixed diabetic hyperlipidemia associated with type 2 diabetes mellitus ([place])   Relevant Orders   ALT (Completed)   Type 2 diabetes mellitus with stage 3 chronic kidney disease ([facility])   Relevant Orders   ALT (Completed)   POCT glycosylated hemoglobin (Hb A1C) (Completed)   Diabetes mellitus ([place]) - Primary   Relevant Orders   ALT (Completed)   POCT glycosylated hemoglobin (Hb A1C) (Completed)    Other Visit Diagnoses    On statin therapy       Relevant Orders   ALT (Completed)     Diabetes Mellitus: - A1c today is 6.6, stable - Continue Metformin 500 mg - Continue ambulatory glucose monitoring and notify clinic if FBS consistently <80 or >160. - Follow low  glucose/carbohydrate diet and continue daily walking - Unable to collect UA microalbumin so will attempt next OV.  HTN: - BP today is 143/70, HR 87, recheck BP 124/67- improved and stable. - Continue Amlodipine, HCTZ, Losartan. - Encourage ambulatory BP and pulse monitoring, especially when not feeling well. - Continue DASH diet. - Stay well hydrated, at least 64 fl oz  HLD: - Last lipid panel wnl's with exception of mildly elevated triglycerides. - Continue Lipitor 40 mg. - Follow heart healthy diet.  - Plan to recheck lipid panel next OV. Will recheck ALT today.  Hypothyroid:  - Stable and asymptomatic  - Last TSH wnl's - Continue Levothyroxine 50 mcg  No orders of the defined types were placed in this encounter.   Follow-up: Return for [facility] in 3-4 months and [facility] few days prior.    Lorrene Reid, PA-C

## 2020-06-03 NOTE — Patient Instructions (Signed)

## 2020-06-04 LAB — ALT: ALT: 35 IU/L — ABNORMAL HIGH (ref 0–32)

## 2020-06-09 ENCOUNTER — Other Ambulatory Visit: Payer: Self-pay | Admitting: Physician Assistant

## 2020-06-09 DIAGNOSIS — I152 Hypertension secondary to endocrine disorders: Secondary | ICD-10-CM

## 2020-06-09 DIAGNOSIS — K582 Mixed irritable bowel syndrome: Secondary | ICD-10-CM

## 2020-06-09 DIAGNOSIS — E039 Hypothyroidism, unspecified: Secondary | ICD-10-CM

## 2020-06-09 MED ORDER — AMLODIPINE BESYLATE 5 MG PO TABS: 5.0000 mg | ORAL_TABLET | Freq: Every day | ORAL | 0 refills | Status: DC

## 2020-06-09 MED ORDER — ALLOPURINOL 100 MG PO TABS: 100.0000 mg | ORAL_TABLET | Freq: Every day | ORAL | 0 refills | Status: DC

## 2020-06-09 MED ORDER — HYDROCHLOROTHIAZIDE 12.5 MG PO TABS: 12.5000 mg | ORAL_TABLET | Freq: Every day | ORAL | 0 refills | Status: DC

## 2020-06-09 MED ORDER — LOSARTAN POTASSIUM 50 MG PO TABS: 50.0000 mg | ORAL_TABLET | Freq: Every day | ORAL | 0 refills | Status: DC

## 2020-06-09 MED ORDER — SERTRALINE HCL 25 MG PO TABS: 25.0000 mg | ORAL_TABLET | Freq: Every day | ORAL | 0 refills | Status: DC

## 2020-06-09 MED ORDER — LEVOTHYROXINE SODIUM 50 MCG PO TABS: 50.0000 ug | ORAL_TABLET | Freq: Every day | ORAL | 0 refills | Status: DC

## 2020-06-11 ENCOUNTER — Telehealth: Payer: Self-pay | Admitting: Physician Assistant

## 2020-06-11 NOTE — Telephone Encounter (Signed)
-----   Message from Lorrene Reid, [place] sent at 06/06/2020  3:00 PM EDT ----- Jefm Bryant,  Please call Ms. Rafter and notify liver function is stable. Her ALT has slightly improved from 4 months ago. Continue current medication regimen.   Thank you, Herb Grays

## 2020-06-18 ENCOUNTER — Other Ambulatory Visit: Payer: Self-pay | Admitting: Family Medicine

## 2020-06-18 DIAGNOSIS — N183 Chronic kidney disease, stage 3 unspecified: Secondary | ICD-10-CM

## 2020-06-18 DIAGNOSIS — E1122 Type 2 diabetes mellitus with diabetic chronic kidney disease: Secondary | ICD-10-CM

## 2020-06-23 ENCOUNTER — Other Ambulatory Visit: Payer: Self-pay | Admitting: Physician Assistant

## 2020-06-23 DIAGNOSIS — E1122 Type 2 diabetes mellitus with diabetic chronic kidney disease: Secondary | ICD-10-CM

## 2020-06-23 MED ORDER — METFORMIN HCL ER 500 MG PO TB24: 500.0000 mg | ORAL_TABLET | Freq: Every day | ORAL | 0 refills | Status: DC

## 2020-07-06 ENCOUNTER — Other Ambulatory Visit: Payer: Self-pay | Admitting: Family Medicine

## 2020-07-06 DIAGNOSIS — E1169 Type 2 diabetes mellitus with other specified complication: Secondary | ICD-10-CM

## 2020-07-09 ENCOUNTER — Telehealth: Payer: Self-pay | Admitting: Physician Assistant

## 2020-07-09 DIAGNOSIS — E1169 Type 2 diabetes mellitus with other specified complication: Secondary | ICD-10-CM

## 2020-07-09 MED ORDER — ATORVASTATIN CALCIUM 40 MG PO TABS: 40.0000 mg | ORAL_TABLET | Freq: Every day | ORAL | 0 refills | Status: DC

## 2020-07-09 NOTE — Telephone Encounter (Signed)
Patient's husband called in asking for a refill on astrovacin 40 mg.

## 2020-07-09 NOTE — Addendum Note (Signed)
Addended by: Fonnie Mu on: 07/09/2020 10:28 AM   Modules accepted: Orders

## 2020-09-21 ENCOUNTER — Other Ambulatory Visit: Payer: Self-pay | Admitting: Physician Assistant

## 2020-09-21 ENCOUNTER — Other Ambulatory Visit: Payer: Self-pay | Admitting: Family Medicine

## 2020-09-21 DIAGNOSIS — N183 Chronic kidney disease, stage 3 unspecified: Secondary | ICD-10-CM

## 2020-09-21 DIAGNOSIS — I152 Hypertension secondary to endocrine disorders: Secondary | ICD-10-CM

## 2020-09-21 DIAGNOSIS — K582 Mixed irritable bowel syndrome: Secondary | ICD-10-CM

## 2020-09-21 DIAGNOSIS — E1159 Type 2 diabetes mellitus with other circulatory complications: Secondary | ICD-10-CM

## 2020-09-21 DIAGNOSIS — E039 Hypothyroidism, unspecified: Secondary | ICD-10-CM

## 2020-09-21 DIAGNOSIS — E1169 Type 2 diabetes mellitus with other specified complication: Secondary | ICD-10-CM

## 2020-09-21 DIAGNOSIS — E1122 Type 2 diabetes mellitus with diabetic chronic kidney disease: Secondary | ICD-10-CM

## 2020-09-30 ENCOUNTER — Other Ambulatory Visit: Payer: Self-pay | Admitting: Physician Assistant

## 2020-09-30 MED ORDER — ALLOPURINOL 100 MG PO TABS
100.0000 mg | ORAL_TABLET | Freq: Every day | ORAL | 0 refills | Status: DC
Start: 2020-09-30 — End: 2021-02-02

## 2020-10-02 ENCOUNTER — Other Ambulatory Visit: Payer: Self-pay | Admitting: Physician Assistant

## 2020-10-02 DIAGNOSIS — E039 Hypothyroidism, unspecified: Secondary | ICD-10-CM

## 2020-10-02 DIAGNOSIS — N183 Chronic kidney disease, stage 3 unspecified: Secondary | ICD-10-CM

## 2020-10-02 DIAGNOSIS — E1122 Type 2 diabetes mellitus with diabetic chronic kidney disease: Secondary | ICD-10-CM

## 2020-10-02 DIAGNOSIS — E559 Vitamin D deficiency, unspecified: Secondary | ICD-10-CM

## 2020-10-02 DIAGNOSIS — I10 Essential (primary) hypertension: Secondary | ICD-10-CM

## 2020-10-02 DIAGNOSIS — E1169 Type 2 diabetes mellitus with other specified complication: Secondary | ICD-10-CM

## 2020-10-06 ENCOUNTER — Other Ambulatory Visit: Payer: Self-pay

## 2020-10-06 ENCOUNTER — Other Ambulatory Visit: Payer: Medicare Other

## 2020-10-06 DIAGNOSIS — I10 Essential (primary) hypertension: Secondary | ICD-10-CM

## 2020-10-06 DIAGNOSIS — E1169 Type 2 diabetes mellitus with other specified complication: Secondary | ICD-10-CM | POA: Diagnosis not present

## 2020-10-06 DIAGNOSIS — E039 Hypothyroidism, unspecified: Secondary | ICD-10-CM | POA: Diagnosis not present

## 2020-10-06 DIAGNOSIS — N183 Chronic kidney disease, stage 3 unspecified: Secondary | ICD-10-CM

## 2020-10-06 DIAGNOSIS — E1122 Type 2 diabetes mellitus with diabetic chronic kidney disease: Secondary | ICD-10-CM | POA: Diagnosis not present

## 2020-10-06 DIAGNOSIS — E559 Vitamin D deficiency, unspecified: Secondary | ICD-10-CM

## 2020-10-06 DIAGNOSIS — E782 Mixed hyperlipidemia: Secondary | ICD-10-CM

## 2020-10-07 LAB — COMPREHENSIVE METABOLIC PANEL
ALT: 29 IU/L (ref 0–32)
AST: 23 IU/L (ref 0–40)
Albumin/Globulin Ratio: 1.8 (ref 1.2–2.2)
Albumin: 4.6 g/dL (ref 3.7–4.7)
Alkaline Phosphatase: 79 IU/L (ref 44–121)
BUN/Creatinine Ratio: 17 (ref 12–28)
BUN: 18 mg/dL (ref 8–27)
Bilirubin Total: 0.5 mg/dL (ref 0.0–1.2)
CO2: 25 mmol/L (ref 20–29)
Calcium: 10 mg/dL (ref 8.7–10.3)
Chloride: 100 mmol/L (ref 96–106)
Creatinine, Ser: 1.03 mg/dL — ABNORMAL HIGH (ref 0.57–1.00)
GFR calc Af Amer: 62 mL/min/{1.73_m2} (ref >59)
GFR calc non Af Amer: 54 mL/min/{1.73_m2} — ABNORMAL LOW (ref >59)
Globulin, Total: 2.5 g/dL (ref 1.5–4.5)
Glucose: 140 mg/dL — ABNORMAL HIGH (ref 65–99)
Potassium: 4.1 mmol/L (ref 3.5–5.2)
Sodium: 142 mmol/L (ref 134–144)
Total Protein: 7.1 g/dL (ref 6.0–8.5)

## 2020-10-07 LAB — LIPID PANEL
Chol/HDL Ratio: 3.3 ratio (ref 0.0–4.4)
Cholesterol, Total: 142 mg/dL (ref 100–199)
HDL: 43 mg/dL (ref >39)
LDL Chol Calc (NIH): 69 mg/dL (ref 0–99)
Triglycerides: 180 mg/dL — ABNORMAL HIGH (ref 0–149)
VLDL Cholesterol Cal: 30 mg/dL (ref 5–40)

## 2020-10-07 LAB — T3: T3, Total: 94 ng/dL (ref 71–180)

## 2020-10-07 LAB — CBC
Hematocrit: 41 % (ref 34.0–46.6)
Hemoglobin: 13.8 g/dL (ref 11.1–15.9)
MCH: 30.8 pg (ref 26.6–33.0)
MCHC: 33.7 g/dL (ref 31.5–35.7)
MCV: 92 fL (ref 79–97)
Platelets: 236 10*3/uL (ref 150–450)
RBC: 4.48 x10E6/uL (ref 3.77–5.28)
RDW: 12.3 % (ref 11.7–15.4)
WBC: 5.9 10*3/uL (ref 3.4–10.8)

## 2020-10-07 LAB — VITAMIN D 25 HYDROXY (VIT D DEFICIENCY, FRACTURES): Vit D, 25-Hydroxy: 63.3 ng/mL (ref 30.0–100.0)

## 2020-10-07 LAB — T4, FREE: Free T4: 1.32 ng/dL (ref 0.82–1.77)

## 2020-10-07 LAB — TSH: TSH: 3.47 u[IU]/mL (ref 0.450–4.500)

## 2020-10-07 LAB — HEMOGLOBIN A1C
Est. average glucose Bld gHb Est-mCnc: 143 mg/dL
Hgb A1c MFr Bld: 6.6 % — ABNORMAL HIGH (ref 4.8–5.6)

## 2020-10-08 ENCOUNTER — Ambulatory Visit: Payer: Medicare Other

## 2020-10-09 ENCOUNTER — Other Ambulatory Visit: Payer: Self-pay

## 2020-10-09 ENCOUNTER — Encounter: Payer: Self-pay | Admitting: Physician Assistant

## 2020-10-09 ENCOUNTER — Ambulatory Visit (INDEPENDENT_AMBULATORY_CARE_PROVIDER_SITE_OTHER): Payer: Medicare Other | Admitting: Physician Assistant

## 2020-10-09 VITALS — BP 123/69 | HR 83 | Ht 65.0 in | Wt 159.2 lb

## 2020-10-09 DIAGNOSIS — E1169 Type 2 diabetes mellitus with other specified complication: Secondary | ICD-10-CM | POA: Diagnosis not present

## 2020-10-09 DIAGNOSIS — E782 Mixed hyperlipidemia: Secondary | ICD-10-CM

## 2020-10-09 DIAGNOSIS — E1159 Type 2 diabetes mellitus with other circulatory complications: Secondary | ICD-10-CM | POA: Diagnosis not present

## 2020-10-09 DIAGNOSIS — Z23 Encounter for immunization: Secondary | ICD-10-CM | POA: Diagnosis not present

## 2020-10-09 DIAGNOSIS — N183 Chronic kidney disease, stage 3 unspecified: Secondary | ICD-10-CM

## 2020-10-09 DIAGNOSIS — I152 Hypertension secondary to endocrine disorders: Secondary | ICD-10-CM

## 2020-10-09 DIAGNOSIS — Z0000 Encounter for general adult medical examination without abnormal findings: Secondary | ICD-10-CM

## 2020-10-09 DIAGNOSIS — E1122 Type 2 diabetes mellitus with diabetic chronic kidney disease: Secondary | ICD-10-CM

## 2020-10-09 DIAGNOSIS — E039 Hypothyroidism, unspecified: Secondary | ICD-10-CM

## 2020-10-09 DIAGNOSIS — N3281 Overactive bladder: Secondary | ICD-10-CM

## 2020-10-09 MED ORDER — OXYBUTYNIN CHLORIDE ER 5 MG PO TB24: 5.0000 mg | ORAL_TABLET | Freq: Every day | ORAL | 2 refills | Status: DC

## 2020-10-09 NOTE — Progress Notes (Signed)
Subjective:   Jill Daugherty is a 74 y.o. female who presents for Medicare Annual (Subsequent) preventive examination.  Review of Systems    General:   No F/C, wt loss Pulm:   No DIB, SOB, pleuritic chest pain Card:  No CP, palpitations Abd:  No n/v/d or pain Ext:  No inc edema from baseline  Objective:    Today's Vitals   10/09/20 1310  BP: 123/69  Pulse: 83  SpO2: 96%  Weight: 159 lb 3.2 oz (72.2 kg)  Height: 5\' 5"  (1.651 m)   Body mass index is 26.49 kg/m.  No flowsheet data found.  Current Medications (verified) Outpatient Encounter Medications as of 10/09/2020  Medication Sig   allopurinol (ZYLOPRIM) 100 MG tablet Take 1 tablet (100 mg total) by mouth daily.   amLODipine (NORVASC) 5 MG tablet Take 1 tablet by mouth once daily   aspirin EC 81 MG tablet Take 81 mg by mouth daily.   atorvastatin (LIPITOR) 40 MG tablet TAKE 1 TABLET BY MOUTH AT BEDTIME   Calcium Carbonate (CALCIUM 600 PO) Take by mouth.   Cholecalciferol (VITAMIN D3) 1000 units CAPS Take 2 capsules by mouth.   famotidine (PEPCID) 20 MG tablet Take 1 tablet (20 mg total) by mouth 2 (two) times daily.   fluticasone (FLONASE) 50 MCG/ACT nasal spray USE 1 SPRAY(S) IN EACH NOSTRIL TWICE DAILY FOR 14 DAYS   hydrochlorothiazide (HYDRODIURIL) 12.5 MG tablet Take 1 tablet by mouth once daily   levothyroxine (SYNTHROID) 50 MCG tablet Take 1 tablet by mouth once daily   loratadine (CLARITIN) 10 MG tablet Take 1 tablet (10 mg total) by mouth daily.   losartan (COZAAR) 50 MG tablet Take 1 tablet by mouth once daily   metFORMIN (GLUCOPHAGE-XR) 500 MG 24 hr tablet Take 1 tablet by mouth once daily   Miconazole Nitrate 2 % POWD Apply to affected folds of skin twice daily only when red\ irritated   omeprazole (PRILOSEC) 20 MG capsule TAKE 1 CAPSULE BY MOUTH ONCE DAILY AS NEEDED FOR REFLUX/HEARTBURN   sertraline (ZOLOFT) 25 MG tablet Take 1 tablet by mouth once daily   [DISCONTINUED]  tolterodine (DETROL LA) 2 MG 24 hr capsule Take 1 capsule (2 mg total) by mouth daily.   oxybutynin (DITROPAN-XL) 5 MG 24 hr tablet Take 1 tablet (5 mg total) by mouth at bedtime.   No facility-administered encounter medications on file as of 10/09/2020.    Allergies (verified) Patient has no known allergies.   History: Past Medical History:  Diagnosis Date   History of chicken pox    History of frequent urinary tract infections    Hyperlipidemia    Hypertension    Overactive bladder    Thyroid disease    No past surgical history on file. Family History  Problem Relation Age of Onset   Heart disease Father    Diabetes Paternal Grandmother    Cancer Paternal Grandmother    Breast cancer Neg Hx    Social History   Socioeconomic History   Marital status: Married    Spouse name: Not on file   Number of children: 1   Years of education: 12   Highest education level: Not on file  Occupational History   Occupation: Retired  Tobacco Use   Smoking status: Never Smoker   Smokeless tobacco: Never Used  Scientific laboratory technician Use: Never used  Substance and Sexual Activity   Alcohol use: No    Alcohol/week: 0.0 standard drinks  Drug use: No   Sexual activity: Not Currently  Other Topics Concern   Not on file  Social History Narrative   Regular exercise-no   Caffeine Use-yes   Social Determinants of Health   Financial Resource Strain:    Difficulty of Paying Living Expenses: Not on file  Food Insecurity:    Worried About [place] in the Last Year: Not on file   Ran Out of Food in the Last Year: Not on file  Transportation Needs:    Lack of Transportation (Medical): Not on file   Lack of Transportation (Non-Medical): Not on file  Physical Activity:    Days of Exercise per Week: Not on file   Minutes of Exercise per Session: Not on file  Stress:    Feeling of Stress : Not on file  Social Connections:    Frequency of  Communication with Friends and Family: Not on file   Frequency of Social Gatherings with Friends and Family: Not on file   Attends Religious Services: Not on file   Active Member of Clubs or Organizations: Not on file   Attends Archivist Meetings: Not on file   Marital Status: Not on file    Tobacco Counseling Counseling given: Not Answered    Diabetic? Yes , stable   Activities of Daily Living In your present state of health, do you have any difficulty performing the following activities: 10/09/2020  Hearing? N  Vision? N  Difficulty concentrating or making decisions? N  Walking or climbing stairs? N  Dressing or bathing? N  Doing errands, shopping? N  Some recent data might be hidden    Patient Care Team: Lorrene Reid, PA-C as PCP - General Druscilla Brownie, MD as Consulting Physician (Dermatology) Webb Laws, [place] as Referring Physician (Optometry) Celedonio Savage, PA-C as Consulting Physician (Dermatology)  Indicate any recent Medical Services you may have received from other than [facility] providers in the past year (date may be approximate).     Assessment:   This is a routine wellness examination for [place].  Hearing/Vision screen No exam data present  Dietary issues and exercise activities discussed:  -Continue to monitor carbohydrates and glucose, reduce saturated and trans fats. Stay well hydrated. -Stay as active as possible.  Goals   None    Depression Screen PHQ 2/9 Scores 10/09/2020 06/03/2020 02/05/2020 10/16/2019 09/20/2019 02/28/2019 01/16/2019  PHQ - 2 Score 0 0 0 0 0 0 0  PHQ- 9 Score 0 0 0 0 0 0 0    Fall Risk Fall Risk  10/09/2020 09/20/2019 01/16/2019 09/12/2018 03/20/2018  Falls in the past year? 0 0 0 No No  Follow up Falls evaluation completed Falls evaluation completed Falls evaluation completed - -    Any stairs in or around the home? No  If so, are there any without handrails? No  Home free of loose throw rugs in  walkways, pet beds, electrical cords, etc? Yes  Adequate lighting in your home to reduce risk of falls? Yes   ASSISTIVE DEVICES UTILIZED TO PREVENT FALLS:  Life alert? No  Use of a cane, walker or w/c? No  Grab bars in the bathroom? No  Shower chair or bench in shower? No  Elevated toilet seat or a handicapped toilet? No   TIMED UP AND GO:  Was the test performed? Yes .  Length of time to ambulate 10 feet: 7 sec.   Gait steady and fast without use of assistive device  Cognitive Function:  6CIT Screen 10/09/2020 09/20/2019  What Year? 4 points 0 points  What month? 0 points 0 points  What time? 0 points 0 points  Count back from 20 2 points 2 points  Months in reverse 2 points 4 points  Repeat phrase 0 points 0 points  Total Score 8 6    Immunizations Immunization History  Administered Date(s) Administered   Fluad Quad(high Dose 65+) 10/09/2019   Influenza, High Dose Seasonal PF 10/05/2018, 10/09/2020   Influenza,inj,Quad PF,6+ Mos 09/28/2015   Influenza-Unspecified 08/27/2012, 07/27/2013, 08/27/2014    TDAP status: Due, Education has been provided regarding the importance of this vaccine. Advised may receive this vaccine at local pharmacy or Health Dept. Aware to provide a copy of the vaccination record if obtained from local pharmacy or Health Dept. Verbalized acceptance and understanding. Flu Vaccine status: Completed at today's visit Pneumococcal vaccine status: Declined,  Education has been provided regarding the importance of this vaccine but patient still declined. Advised may receive this vaccine at local pharmacy or Health Dept. Aware to provide a copy of the vaccination record if obtained from local pharmacy or Health Dept. Verbalized acceptance and understanding.  Covid-19 vaccine status: Completed vaccines  Qualifies for Shingles Vaccine? Yes   Zostavax completed No   Shingrix Completed?: No.    Education has been provided regarding the importance of  this vaccine. Patient has been advised to call insurance company to determine out of pocket expense if they have not yet received this vaccine. Advised may also receive vaccine at local pharmacy or Health Dept. Verbalized acceptance and understanding.  Screening Tests Health Maintenance  Topic Date Due   Hepatitis C Screening  Never done   COVID-19 Vaccine (1) Never done   TETANUS/TDAP  Never done   COLONOSCOPY  Never done   PNA vac Low Risk Adult (1 of 2 - PCV13) Never done   OPHTHALMOLOGY EXAM  12/10/2020   HEMOGLOBIN A1C  04/06/2021   FOOT EXAM  06/03/2021   MAMMOGRAM  01/14/2022   INFLUENZA VACCINE  Completed   DEXA SCAN  Completed    Health Maintenance  Health Maintenance Due  Topic Date Due   Hepatitis C Screening  Never done   COVID-19 Vaccine (1) Never done   TETANUS/TDAP  Never done   COLONOSCOPY  Never done   PNA vac Low Risk Adult (1 of 2 - PCV13) Never done    Colorectal cancer screening: No longer required. Pt declined Mammogram status: Completed [id number]. Repeat every year Bone Density: Completed 01/15/2020  Lung Cancer Screening: (Low Dose CT Chest recommended if Age 12-80 years, 30 pack-year currently smoking OR have quit w/in 15years.) does not qualify.   Lung Cancer Screening Referral:   Additional Screening:  Hepatitis C Screening: does qualify; Completed Patient declined  Vision Screening: Recommended annual ophthalmology exams for early detection of glaucoma and other disorders of the eye. Is the patient up to date with their annual eye exam?  Yes  Who is the provider or what is the name of the office in which the patient attends annual eye exams?  If pt is not established with a provider, would they like to be referred to a provider to establish care? No .   Dental Screening: Recommended annual dental exams for proper oral hygiene  Community Resource Referral / Chronic Care Management: CRR required this visit?  No   CCM required  this visit?  No      Plan:  -Discussed with patient cognition function screening and  reports mother had dementia. Patient denies worsening memory or forgetfulness and prefers to monitor before proceeding with neurology referral. If cognition function screening score continues to increase recommend referral to Neurology. Pt verbalized understanding. -Most labs recently obtained are within normal limits or stable from prior.  -Will send rx for oxybutynin and discontinue tolterodine (pt reports has not been taking). Otherwise, continue current medication regimen. -Continue ambulatory glucose monitoring. -Follow up in 3 months for reg OV: DM, HTN, HLD, thyroid   I have personally reviewed and noted the following in the patients chart:    Medical and social history  Use of alcohol, tobacco or illicit drugs   Current medications and supplements  Functional ability and status  Nutritional status  Physical activity  Advanced directives  List of other physicians  Hospitalizations, surgeries, and ER visits in previous 12 months  Vitals  Screenings to include cognitive, depression, and falls  Referrals and appointments  In addition, I have reviewed and discussed with patient certain preventive protocols, quality metrics, and best practice recommendations. A written personalized care plan for preventive services as well as general preventive health recommendations were provided to patient.    Note:  This note was prepared with assistance of Dragon voice recognition software. Occasional wrong-word or sound-a-like substitutions may have occurred due to the inherent limitations of voice recognition software.   Lorrene Reid, PA-C   10/09/2020

## 2020-10-09 NOTE — Patient Instructions (Addendum)
Overactive Bladder, Adult  Overactive bladder refers to a condition in which a person has a sudden need to pass urine. The person may leak urine if he or she cannot get to the bathroom fast enough (urinary incontinence). A person with this condition may also wake up several times in the night to go to the bathroom. Overactive bladder is associated with poor nerve signals between your bladder and your brain. Your bladder may get the signal to empty before it is full. You may also have very sensitive muscles that make your bladder squeeze too soon. These symptoms might interfere with daily work or social activities. What are the causes? This condition may be associated with or caused by:  Urinary tract infection.  Infection of nearby tissues, such as the prostate.  Prostate enlargement.  Surgery on the uterus or urethra.  Bladder stones, inflammation, or tumors.  Drinking too much caffeine or alcohol.  Certain medicines, especially medicines that get rid of extra fluid in the body (diuretics).  Muscle or nerve weakness, especially from: ? A spinal cord injury. ? Stroke. ? Multiple sclerosis. ? Parkinson's disease.  Diabetes.  Constipation. What increases the risk? You may be at greater risk for overactive bladder if you:  Are an older adult.  Smoke.  Are going through menopause.  Have prostate problems.  Have a neurological disease, such as stroke, dementia, Parkinson's disease, or multiple sclerosis (MS).  Eat or drink things that irritate the bladder. These include alcohol, spicy food, and caffeine.  Are overweight or obese. What are the signs or symptoms? Symptoms of this condition include:  Sudden, strong urge to urinate.  Leaking urine.  Urinating 8 or more times a day.  Waking up to urinate 2 or more times a night. How is this diagnosed? Your health care provider may suspect overactive bladder based on your symptoms. He or she will diagnose this  condition by:  A physical exam and medical history.  Blood or urine tests. You might need bladder or urine tests to help determine what is causing your overactive bladder. You might also need to see a health care provider who specializes in urinary tract problems (urologist). How is this treated? Treatment for overactive bladder depends on the cause of your condition and whether it is mild or severe. You can also make lifestyle changes at home. Options include:  Bladder training. This may include: ? Learning to control the urge to urinate by following a schedule that directs you to urinate at regular intervals (timed voiding). ? Doing Kegel exercises to strengthen your pelvic floor muscles, which support your bladder. Toning these muscles can help you control urination, even if your bladder muscles are overactive.  Special devices. This may include: ? Biofeedback, which uses sensors to help you become aware of your body's signals. ? Electrical stimulation, which uses electrodes placed inside the body (implanted) or outside the body. These electrodes send gentle pulses of electricity to strengthen the nerves or muscles that control the bladder. ? Women may use a plastic device that fits into the vagina and supports the bladder (pessary).  Medicines. ? Antibiotics to treat bladder infection. ? Antispasmodics to stop the bladder from releasing urine at the wrong time. ? Tricyclic antidepressants to relax bladder muscles. ? Injections of botulinum toxin type A directly into the bladder tissue to relax bladder muscles.  Lifestyle changes. This may include: ? Weight loss. Talk to your health care provider about weight loss methods that would work best for you. ?  Diet changes. This may include reducing how much alcohol and caffeine you consume, or drinking fluids at different times of the day. ? Not smoking. Do not use any products that contain nicotine or tobacco, such as cigarettes and  e-cigarettes. If you need help quitting, ask your health care provider.  Surgery. ? A device may be implanted to help manage the nerve signals that control urination. ? An electrode may be implanted to stimulate electrical signals in the bladder. ? A procedure may be done to change the shape of the bladder. This is done only in very severe cases. Follow these instructions at home: Lifestyle  Make any diet or lifestyle changes that are recommended by your health care provider. These may include: ? Drinking less fluid or drinking fluids at different times of the day. ? Cutting down on caffeine or alcohol. ? Doing Kegel exercises. ? Losing weight if needed. ? Eating a healthy and balanced diet to prevent constipation. This may include:  Eating foods that are high in fiber, such as fresh fruits and vegetables, whole grains, and beans.  Limiting foods that are high in fat and processed sugars, such as fried and sweet foods. General instructions  Take over-the-counter and prescription medicines only as told by your health care provider.  If you were prescribed an antibiotic medicine, take it as told by your health care provider. Do not stop taking the antibiotic even if you start to feel better.  Use any implants or pessary as told by your health care provider.  If needed, wear pads to absorb urine leakage.  Keep a journal or log to track how much and when you drink and when you feel the need to urinate. This will help your health care provider monitor your condition.  Keep all follow-up visits as told by your health care provider. This is important. Contact a health care provider if:  You have a fever.  Your symptoms do not get better with treatment.  Your pain and discomfort get worse.  You have more frequent urges to urinate. Get help right away if:  You are not able to control your bladder. Summary  Overactive bladder refers to a condition in which a person has a sudden  need to pass urine.  Several conditions may lead to an overactive bladder.  Treatment for overactive bladder depends on the cause and severity of your condition.  Follow your health care provider's instructions about lifestyle changes, doing Kegel exercises, keeping a journal, and taking medicines. This information is not intended to replace advice given to you by your health care provider. Make sure you discuss any questions you have with your health care provider. Document Revised: 04/05/2019 Document Reviewed: 12/29/2017 Elsevier Patient Education  2020 [place] 65 Years and Older, Female Preventive care refers to lifestyle choices and visits with your health care provider that can promote health and wellness. This includes:  A yearly physical exam. This is also called an annual well check.  Regular dental and eye exams.  Immunizations.  Screening for certain conditions.  Healthy lifestyle choices, such as diet and exercise. What can I expect for my preventive care visit? Physical exam Your health care provider will check:  Height and weight. These may be used to calculate body mass index (BMI), which is a measurement that tells if you are at a healthy weight.  Heart rate and blood pressure.  Your skin for abnormal spots. Counseling Your health care provider may ask you questions  about:  Alcohol, tobacco, and drug use.  Emotional well-being.  Home and relationship well-being.  Sexual activity.  Eating habits.  History of falls.  Memory and ability to understand (cognition).  Work and work Statistician.  Pregnancy and menstrual history. What immunizations do I need?  Influenza (flu) vaccine  This is recommended every year. Tetanus, diphtheria, and pertussis (Tdap) vaccine  You may need a Td booster every 10 years. Varicella (chickenpox) vaccine  You may need this vaccine if you have not already been vaccinated. Zoster (shingles)  vaccine  You may need this after age 23. Pneumococcal conjugate (PCV13) vaccine  One dose is recommended after age 6. Pneumococcal polysaccharide (PPSV23) vaccine  One dose is recommended after age 48. Measles, mumps, and rubella (MMR) vaccine  You may need at least one dose of MMR if you were born in 1957 or later. You may also need a second dose. Meningococcal conjugate (MenACWY) vaccine  You may need this if you have certain conditions. Hepatitis A vaccine  You may need this if you have certain conditions or if you travel or work in places where you may be exposed to hepatitis A. Hepatitis B vaccine  You may need this if you have certain conditions or if you travel or work in places where you may be exposed to hepatitis B. Haemophilus influenzae type b (Hib) vaccine  You may need this if you have certain conditions. You may receive vaccines as individual doses or as more than one vaccine together in one shot (combination vaccines). Talk with your health care provider about the risks and benefits of combination vaccines. What tests do I need? Blood tests  Lipid and cholesterol levels. These may be checked every 5 years, or more frequently depending on your overall health.  Hepatitis C test.  Hepatitis B test. Screening  Lung cancer screening. You may have this screening every year starting at age 5 if you have a 30-pack-year history of smoking and currently smoke or have quit within the past 15 years.  Colorectal cancer screening. All adults should have this screening starting at age 5 and continuing until age 36. Your health care provider may recommend screening at age 59 if you are at increased risk. You will have tests every 1-10 years, depending on your results and the type of screening test.  Diabetes screening. This is done by checking your blood sugar (glucose) after you have not eaten for a while (fasting). You may have this done every 1-3 years.  Mammogram. This  may be done every 1-2 years. Talk with your health care provider about how often you should have regular mammograms.  BRCA-related cancer screening. This may be done if you have a family history of breast, ovarian, tubal, or peritoneal cancers. Other tests  Sexually transmitted disease (STD) testing.  Bone density scan. This is done to screen for osteoporosis. You may have this done starting at age 72. Follow these instructions at home: Eating and drinking  Eat a diet that includes fresh fruits and vegetables, whole grains, lean protein, and low-fat dairy products. Limit your intake of foods with high amounts of sugar, saturated fats, and salt.  Take vitamin and mineral supplements as recommended by your health care provider.  Do not drink alcohol if your health care provider tells you not to drink.  If you drink alcohol: ? Limit how much you have to 0-1 drink a day. ? Be aware of how much alcohol is in your drink. In the [place] one  drink equals one 12 oz bottle of beer (355 mL), one 5 oz glass of wine (148 mL), or one 1 oz glass of hard liquor (44 mL). Lifestyle  Take daily care of your teeth and gums.  Stay active. Exercise for at least 30 minutes on 5 or more days each week.  Do not use any products that contain nicotine or tobacco, such as cigarettes, e-cigarettes, and chewing tobacco. If you need help quitting, ask your health care provider.  If you are sexually active, practice safe sex. Use a condom or other form of protection in order to prevent STIs (sexually transmitted infections).  Talk with your health care provider about taking a low-dose aspirin or statin. What's next?  Go to your health care provider once a year for a well check visit.  Ask your health care provider how often you should have your eyes and teeth checked.  Stay up to date on all vaccines. This information is not intended to replace advice given to you by your health care provider. Make sure you  discuss any questions you have with your health care provider. Document Revised: 12/07/2018 Document Reviewed: 12/07/2018 Elsevier Patient Education  2020 [employer].

## 2020-11-09 ENCOUNTER — Other Ambulatory Visit: Payer: Self-pay | Admitting: Physician Assistant

## 2020-11-09 DIAGNOSIS — E039 Hypothyroidism, unspecified: Secondary | ICD-10-CM

## 2020-12-08 ENCOUNTER — Telehealth: Payer: Self-pay | Admitting: Physician Assistant

## 2020-12-08 NOTE — Telephone Encounter (Signed)
Patient needs a refill on some medications, hydrodiuril, losartan. [facility] on [address] Lilian Coma you check and see if I missed any? There were some I believe that might need to be filled as well that he may have missed. Thanks

## 2020-12-13 ENCOUNTER — Other Ambulatory Visit: Payer: Self-pay | Admitting: Physician Assistant

## 2020-12-13 DIAGNOSIS — E1122 Type 2 diabetes mellitus with diabetic chronic kidney disease: Secondary | ICD-10-CM

## 2020-12-13 DIAGNOSIS — E1169 Type 2 diabetes mellitus with other specified complication: Secondary | ICD-10-CM

## 2020-12-13 DIAGNOSIS — I152 Hypertension secondary to endocrine disorders: Secondary | ICD-10-CM

## 2020-12-13 DIAGNOSIS — K582 Mixed irritable bowel syndrome: Secondary | ICD-10-CM

## 2020-12-13 DIAGNOSIS — N183 Chronic kidney disease, stage 3 unspecified: Secondary | ICD-10-CM

## 2020-12-13 DIAGNOSIS — E1159 Type 2 diabetes mellitus with other circulatory complications: Secondary | ICD-10-CM

## 2020-12-24 DIAGNOSIS — E119 Type 2 diabetes mellitus without complications: Secondary | ICD-10-CM | POA: Diagnosis not present

## 2020-12-24 DIAGNOSIS — H524 Presbyopia: Secondary | ICD-10-CM | POA: Diagnosis not present

## 2021-01-08 ENCOUNTER — Encounter: Payer: Self-pay | Admitting: Physician Assistant

## 2021-01-08 ENCOUNTER — Ambulatory Visit (INDEPENDENT_AMBULATORY_CARE_PROVIDER_SITE_OTHER): Payer: Medicare Other | Admitting: Physician Assistant

## 2021-01-08 VITALS — Ht 65.0 in | Wt 156.0 lb

## 2021-01-08 DIAGNOSIS — M109 Gout, unspecified: Secondary | ICD-10-CM | POA: Diagnosis not present

## 2021-01-08 DIAGNOSIS — I152 Hypertension secondary to endocrine disorders: Secondary | ICD-10-CM | POA: Diagnosis not present

## 2021-01-08 DIAGNOSIS — K582 Mixed irritable bowel syndrome: Secondary | ICD-10-CM

## 2021-01-08 DIAGNOSIS — E1122 Type 2 diabetes mellitus with diabetic chronic kidney disease: Secondary | ICD-10-CM | POA: Diagnosis not present

## 2021-01-08 DIAGNOSIS — F4323 Adjustment disorder with mixed anxiety and depressed mood: Secondary | ICD-10-CM

## 2021-01-08 DIAGNOSIS — E1159 Type 2 diabetes mellitus with other circulatory complications: Secondary | ICD-10-CM

## 2021-01-08 DIAGNOSIS — N1831 Chronic kidney disease, stage 3a: Secondary | ICD-10-CM | POA: Diagnosis not present

## 2021-01-08 DIAGNOSIS — R0981 Nasal congestion: Secondary | ICD-10-CM

## 2021-01-08 MED ORDER — PREDNISONE 10 MG PO TABS: 10.0000 mg | ORAL_TABLET | Freq: Every day | ORAL | 0 refills | Status: DC

## 2021-01-08 MED ORDER — METFORMIN HCL ER 500 MG PO TB24: ORAL_TABLET | ORAL | 0 refills | Status: DC

## 2021-01-08 NOTE — Progress Notes (Signed)
Telehealth office visit note for Jill Reid, PA-C- at [facility]   I connected with current patient today by telephone and verified that I am speaking with the correct person   . Location of the patient: Home . Location of the provider: Office - This visit type was conducted due to national recommendations for restrictions regarding the COVID-19 Pandemic (e.g. social distancing) in an effort to limit this patient's exposure and mitigate transmission in our community.    - No physical exam could be performed with this format, beyond that communicated to [place] by the patient/ family members as noted.   - Additionally my office staff/ schedulers were to discuss with the patient that there may be a monetary charge related to this service, depending on their medical insurance.  My understanding is that patient understood and consented to proceed.     _________________________________________________________________________________   History of Present Illness: Patient calls in to follow-up on diabetes mellitus, hypertension, and gout.  Has complaints of feeling stuffy for a few weeks.  Reports initial symptoms were sneezing and runny nose which have resolved.  Has been taking Mucinex, using a humidifier and warm showers with minimal improvement.  Denies fever, cough, sore throat, body aches or loss of taste or smell.  Has received 2 doses of COVID-vaccine but not the booster.  Has not been tested for COVID-19 or influenza.  Has an upcoming surgery for removal of cataract of right eye.  Diabetes mellitus: Pt denies increased urination or thirst. Pt reports medication compliance. No hypoglycemic events. Checking glucose at home.  Since she has been sick has noticed fasting blood sugars have been elevated, range from 150-170.  Denies dietary changes, continues to watch carbohydrates and glucose.  HTN: Pt denies chest pain, palpitations, dizziness or lower extremity swelling.  Taking medication as directed without side effects.  Does not check blood pressure at home. Pt reports good hydration  Gout: Has been on allopurinol for several years.  No recent gout flareups.  Mood: Patient unsure why she takes sertraline.  Per chart review, discussed with started on sertraline for IBS and anxiety.    GAD 7 : Generalized Anxiety Score 02/28/2019 11/07/2018  Nervous, Anxious, on Edge 0 0  Control/stop worrying 0 0  Worry too much - different things 0 0  Trouble relaxing 0 0  Restless 0 0  Easily annoyed or irritable 0 0  Afraid - awful might happen 0 0  Total GAD 7 Score 0 0  Anxiety Difficulty - Not difficult at all    Depression screen [facility] 2/9 01/08/2021 10/09/2020 06/03/2020 02/05/2020 10/16/2019  Decreased Interest 0 0 0 0 0  Down, Depressed, Hopeless 0 0 0 0 0  PHQ - 2 Score 0 0 0 0 0  Altered sleeping 0 0 0 0 0  Tired, decreased energy 0 0 0 0 0  Change in appetite 0 0 0 0 0  Feeling bad or failure about yourself  0 0 0 0 0  Trouble concentrating 0 0 0 0 0  Moving slowly or fidgety/restless 0 0 0 0 0  Suicidal thoughts 0 0 0 0 0  PHQ-9 Score 0 0 0 0 0  Difficult doing work/chores - - - - Not difficult at all  Some recent data might be hidden      Impression and Recommendations:     1. Type 2 diabetes mellitus with stage 3 chronic kidney disease, unspecified whether long term insulin  use, unspecified whether stage 3a or 3b CKD ([place])   2. Hypertension associated with diabetes ([place])   3. Gout, unspecified cause, unspecified chronicity, unspecified site   4. Nasal congestion   5. Type 2 diabetes mellitus with stage 3 chronic kidney disease ([place])   6. Irritable bowel syndrome with both constipation and diarrhea   7. Adjustment disorder with mixed anxiety and depressed mood     Type 2 diabetes mellitus with stage III chronic kidney disease, unspecified whether long term insulin use, unspecified whether stage IIIa or IIIb: -Last A1c 6.6, stable. Will start  low dose of corticosteroid therapy for nasal congestion so discussed with patient increasing metformin dose to 500 mg twice daily instead of once daily to help improve glucose. Patient verbalized understanding and agreeable. -Continue ambulatory glucose monitoring. Recommend to notify the office if glucose readings consistently <80 or >160. -Continue to monitor carbohydrates and glucose. -Will continue to monitor.  Hypertension associated with diabetes: -Previous BP readings in the office have been stable. -Continue current medication regimen. -Last CMP stable. -Will continue to monitor.  Gout, unspecified cause, unspecified chronicity, unspecified site: -Controlled. -Continue allopurinol. -Last hepatic function WNL  Nasal congestion: -Discussed with patient COVID-19 and influenza testing.  Patient adamant symptoms are not related to either and declined testing at this time. -Recommend to continue home supportive care and will start low-dose of corticosteroid to help with congestion. -Advised to consider testing if symptoms fail to improve or worsen or seek immediate medical care if starts developing red flag signs/symptoms such as shortness of breath, confusion, or chest pain.  IBS, Adjustment disorder with mixed anxiety and depressed mood: -Stable. -PHQ-9 score of 0. -Continue sertraline 25 mg.   - As part of my medical decision making, I reviewed the following data within the [place] History obtained from pt /family, CMA notes reviewed and incorporated if applicable, Labs reviewed, Radiograph/ tests reviewed if applicable and OV notes from prior OV's with me, as well as any other specialists she/he has seen since seeing me last, were all reviewed and used in my medical decision making process today.    - Additionally, when appropriate, discussion had with patient regarding our treatment plan, and their biases/concerns about that plan were used in my medical  decision making today.    - The patient agreed with the plan and demonstrated an understanding of the instructions.   No barriers to understanding were identified.     - The patient was advised to call back or seek an in-person evaluation if the symptoms worsen or if the condition fails to improve as anticipated.   Return in about 3 months (around 04/08/2021) for DM, HTN, thyroid .    No orders of the defined types were placed in this encounter.   Meds ordered this encounter  Medications  . predniSONE (DELTASONE) 10 MG tablet    Sig: Take 1 tablet (10 mg total) by mouth daily with breakfast.    Dispense:  5 tablet    Refill:  0    Order Specific Question:   Supervising Provider    Answer:   Beatrice Lecher D [[id number]]  . metFORMIN (GLUCOPHAGE-XR) 500 MG 24 hr tablet    Sig: Take 1 tablet by mouth twice daily with a meal.    Dispense:  90 tablet    Refill:  0    Order Specific Question:   Supervising Provider    Answer:   Beatrice Lecher D [[id number]]    Medications  Discontinued During This Encounter  Medication Reason  . metFORMIN (GLUCOPHAGE-XR) 500 MG 24 hr tablet        Time spent on visit including pre-visit chart review and post-visit care was 22 minutes.      The [place] was signed into law in 2016 which includes the topic of electronic health records.  This provides immediate access to information in MyChart.  This includes consultation notes, operative notes, office notes, lab results and pathology reports.  If you have any questions about what you read please let us know at your next visit or call us at the office.  We are right here with you.  Note:  This note was prepared with assistance of Dragon voice recognition software. Occasional wrong-word or sound-a-like substitutions may have occurred due to the inherent limitations of voice recognition  software.  __________________________________________________________________________________     Patient Care Team    Relationship Specialty Notifications Start End  Jill Daugherty, [place] PCP - General   04/27/20   Druscilla Brownie, MD Consulting Physician Dermatology  01/11/18   Webb Laws, [place] Referring Physician Optometry  01/11/18   Celedonio Savage, PA-C Consulting Physician Dermatology  01/11/18    Comment: derm      -Vitals obtained; medications/ allergies reconciled;  personal medical, social, Sx etc.histories were updated by CMA, reviewed by me and are reflected in chart   Patient Active Problem List   Diagnosis Date Noted  . Type 2 diabetes mellitus with stage 3 chronic kidney disease ([place]) 02/05/2020  . Diabetes mellitus ([place]) 02/05/2020  . Adjustment disorder with mixed anxiety and depressed mood 02/28/2019  . Urinary incontinence in female 11/07/2018  . Gastroesophageal reflux disease 11/07/2018  . Irritable bowel syndrome with both constipation and diarrhea 09/12/2018  . Tinea corporis- lower abd folds 09/12/2018  . Type 2 diabetes mellitus with diabetic chronic kidney disease ([place]) 05/11/2018  . Acute maxillary sinusitis 04/10/2018  . Acute otitis media 04/10/2018  . Cough in adult 04/10/2018  . Noncompliance with diet and medication regimen 03/20/2018  . Family history of early CAD- dad in early 60's 02/07/2018  . Family history of cerebrovascular accident (CVA) in mother- in her 53's 02/07/2018  . Family history of diabetes mellitus (DM)- PGM- in 40's 02/07/2018  . Family history of Alzheimer's disease- Mom in her 80 02/07/2018  . Hypertension associated with diabetes ([place]) 01/11/2018  . Mixed diabetic hyperlipidemia associated with type 2 diabetes mellitus ([place]) 01/11/2018  . Environmental and seasonal allergies 01/11/2018  . Vitamin D deficiency 01/11/2018  . Gout 06/23/2015  . DM2 (diabetes mellitus, type 2) ([place]) 06/23/2015  . GERD (gastroesophageal  reflux disease) 06/23/2015  . OAB (overactive bladder) 06/23/2015  . HTN (hypertension) 06/21/2013  . Hyperlipidemia 06/21/2013  . Hypothyroidism 06/21/2013     Current Meds  Medication Sig  . allopurinol (ZYLOPRIM) 100 MG tablet Take 1 tablet (100 mg total) by mouth daily.  Marland Kitchen amLODipine (NORVASC) 5 MG tablet Take 1 tablet by mouth once daily  . aspirin EC 81 MG tablet Take 81 mg by mouth daily.  Marland Kitchen atorvastatin (LIPITOR) 40 MG tablet TAKE 1 TABLET BY MOUTH AT BEDTIME  . Calcium Carbonate (CALCIUM 600 PO) Take by mouth.  . Cholecalciferol (VITAMIN D3) 1000 units CAPS Take 2 capsules by mouth.  Arna Medici 50 MCG tablet Take 1 tablet by mouth once daily  . famotidine (PEPCID) 20 MG tablet Take 1 tablet (20 mg total) by mouth 2 (two) times daily.  . fluticasone (FLONASE)  50 MCG/ACT nasal spray USE 1 SPRAY(S) IN EACH NOSTRIL TWICE DAILY FOR 14 DAYS  . hydrochlorothiazide (HYDRODIURIL) 12.5 MG tablet Take 1 tablet by mouth once daily  . loratadine (CLARITIN) 10 MG tablet Take 1 tablet (10 mg total) by mouth daily.  Marland Kitchen losartan (COZAAR) 50 MG tablet Take 1 tablet by mouth once daily  . Miconazole Nitrate 2 % POWD Apply to affected folds of skin twice daily only when red\ irritated  . omeprazole (PRILOSEC) 20 MG capsule TAKE 1 CAPSULE BY MOUTH ONCE DAILY AS NEEDED FOR REFLUX/HEARTBURN  . oxybutynin (DITROPAN-XL) 5 MG 24 hr tablet Take 1 tablet (5 mg total) by mouth at bedtime.  . predniSONE (DELTASONE) 10 MG tablet Take 1 tablet (10 mg total) by mouth daily with breakfast.  . sertraline (ZOLOFT) 25 MG tablet Take 1 tablet by mouth once daily  . [DISCONTINUED] metFORMIN (GLUCOPHAGE-XR) 500 MG 24 hr tablet Take 1 tablet by mouth once daily     Allergies:  No Known Allergies   ROS:  See above HPI for pertinent positives and negatives   Objective:   Height 5\' 5"  (1.651 m), weight 156 lb (70.8 kg).  (if some vitals are omitted, this means that patient was UNABLE to obtain them. ) General:  A & O * 3; sounds in no acute distress Respiratory: speaking in full sentences, no conversational dyspnea Psych: insight appears good, mood- appears full

## 2021-01-26 ENCOUNTER — Telehealth: Payer: Self-pay | Admitting: Physician Assistant

## 2021-01-26 ENCOUNTER — Other Ambulatory Visit: Payer: Medicare Other

## 2021-01-26 NOTE — Telephone Encounter (Signed)
Called pt and spoke with Jill Daugherty. Offered to schedule telehealth apt. He declined. I advised him pt would need a neg covid test before scheduling in office apt. He said he would call back after he talks to his wife. AS, CMA

## 2021-01-26 NOTE — Telephone Encounter (Signed)
Patient was told she could call for a appointment for her runny nose and when I offered the appointment Colletta [place] (Supervisor) advised me to tell her to get COVID tested before we schedule her, thanks.

## 2021-01-26 NOTE — Telephone Encounter (Signed)
Please contact patient to schedule telehealth apt for runny nose. AS, CMA

## 2021-01-26 NOTE — Telephone Encounter (Signed)
Patient has a head cold and I believe was saw on January 13 th. I advised her because of her symptoms it would need to be a virtual appointment. She would like in office and I told her they would probably need a negative COVID result. Can patient be see in office?

## 2021-01-27 ENCOUNTER — Other Ambulatory Visit: Payer: Medicare Other

## 2021-01-27 DIAGNOSIS — Z20822 Contact with and (suspected) exposure to covid-19: Secondary | ICD-10-CM | POA: Diagnosis not present

## 2021-01-29 LAB — SARS-COV-2, NAA 2 DAY TAT

## 2021-01-29 LAB — NOVEL CORONAVIRUS, NAA: SARS-CoV-2, NAA: NOT DETECTED

## 2021-01-30 ENCOUNTER — Other Ambulatory Visit: Payer: Self-pay | Admitting: Physician Assistant

## 2021-01-30 ENCOUNTER — Ambulatory Visit (INDEPENDENT_AMBULATORY_CARE_PROVIDER_SITE_OTHER): Payer: Medicare Other | Admitting: Physician Assistant

## 2021-01-30 ENCOUNTER — Encounter: Payer: Self-pay | Admitting: Physician Assistant

## 2021-01-30 VITALS — Ht 65.0 in | Wt 159.0 lb

## 2021-01-30 DIAGNOSIS — E782 Mixed hyperlipidemia: Secondary | ICD-10-CM

## 2021-01-30 DIAGNOSIS — E1159 Type 2 diabetes mellitus with other circulatory complications: Secondary | ICD-10-CM

## 2021-01-30 DIAGNOSIS — E1169 Type 2 diabetes mellitus with other specified complication: Secondary | ICD-10-CM

## 2021-01-30 DIAGNOSIS — E039 Hypothyroidism, unspecified: Secondary | ICD-10-CM

## 2021-01-30 DIAGNOSIS — J309 Allergic rhinitis, unspecified: Secondary | ICD-10-CM | POA: Diagnosis not present

## 2021-01-30 DIAGNOSIS — K582 Mixed irritable bowel syndrome: Secondary | ICD-10-CM

## 2021-01-30 DIAGNOSIS — E1122 Type 2 diabetes mellitus with diabetic chronic kidney disease: Secondary | ICD-10-CM

## 2021-01-30 DIAGNOSIS — I152 Hypertension secondary to endocrine disorders: Secondary | ICD-10-CM

## 2021-01-30 DIAGNOSIS — N1831 Chronic kidney disease, stage 3a: Secondary | ICD-10-CM

## 2021-01-30 MED ORDER — IPRATROPIUM BROMIDE 0.03 % NA SOLN: 2.0000 | Freq: Two times a day (BID) | NASAL | 0 refills | Status: DC

## 2021-01-30 NOTE — Patient Instructions (Signed)

## 2021-01-30 NOTE — Progress Notes (Signed)
Telehealth office visit note for Jill Reid, PA-C- at Primary Care at [facility]   I connected with current patient today by telephone and verified that I am speaking with the correct person   . Location of the patient: Home . Location of the provider: Office - This visit type was conducted due to national recommendations for restrictions regarding the COVID-19 Pandemic (e.g. social distancing) in an effort to limit this patient's exposure and mitigate transmission in our community.    - No physical exam could be performed with this format, beyond that communicated to [place] by the patient/ family members as noted.   - Additionally my office staff/ schedulers were to discuss with the patient that there may be a monetary charge related to this service, depending on their medical insurance.  My understanding is that patient understood and consented to proceed.     _________________________________________________________________________________   History of Present Illness: Patient calls in with c/o of nasal congestion related to her allergies. States did not tolerate low dose of prednisone. Has been using her nasal spray (Flonase) and taking Zyrtec. Is not able to do nasal rinses or use a Neti Pot. Denies fever, sinus pressure, headache, cough, shortness of breath, earache or sore throat. Had a negative Covid test 01/27/21.     GAD 7 : Generalized Anxiety Score 02/28/2019 11/07/2018  Nervous, Anxious, on Edge 0 0  Control/stop worrying 0 0  Worry too much - different things 0 0  Trouble relaxing 0 0  Restless 0 0  Easily annoyed or irritable 0 0  Afraid - awful might happen 0 0  Total GAD 7 Score 0 0  Anxiety Difficulty - Not difficult at all    Depression screen [facility] 2/9 01/30/2021 01/08/2021 10/09/2020 06/03/2020 02/05/2020  Decreased Interest 0 0 0 0 0  Down, Depressed, Hopeless 0 0 0 0 0  PHQ - 2 Score 0 0 0 0 0  Altered sleeping 0 0 0 0 0  Tired, decreased energy 0 0 0 0 0   Change in appetite 0 0 0 0 0  Feeling bad or failure about yourself  0 0 0 0 0  Trouble concentrating 0 0 0 0 0  Moving slowly or fidgety/restless 0 0 0 0 0  Suicidal thoughts 0 0 0 0 0  PHQ-9 Score 0 0 0 0 0  Difficult doing work/chores - - - - -  Some recent data might be hidden      Impression and Recommendations:     1. Allergic rhinitis, unspecified seasonality, unspecified trigger     Allergic rhinitis, unspecified seasonality, unspecified trigger: -Recommend to continue with home supportive care. Will send Atrovent nasal spray. -Recommend to follow up with ENT if symptoms fail to improve or worsen.   - As part of my medical decision making, I reviewed the following data within the [place] History obtained from pt /family, CMA notes reviewed and incorporated if applicable, Labs reviewed, Radiograph/ tests reviewed if applicable and OV notes from prior OV's with me, as well as any other specialists she/he has seen since seeing me last, were all reviewed and used in my medical decision making process today.    - Additionally, when appropriate, discussion had with patient regarding our treatment plan, and their biases/concerns about that plan were used in my medical decision making today.    - The patient agreed with the plan and demonstrated an understanding of the instructions.  No barriers to understanding were identified.     - The patient was advised to call back or seek an in-person evaluation if the symptoms worsen or if the condition fails to improve as anticipated.   Return if symptoms worsen or fail to improve.    No orders of the defined types were placed in this encounter.   Meds ordered this encounter  Medications  . ipratropium (ATROVENT) 0.03 % nasal spray    Sig: Place 2 sprays into both nostrils every 12 (twelve) hours.    Dispense:  30 mL    Refill:  0    Order Specific Question:   Supervising Provider    Answer:   Beatrice Lecher D [[id number]]    Medications Discontinued During This Encounter  Medication Reason  . predniSONE (DELTASONE) 10 MG tablet Patient Preference       Time spent on visit including pre-visit chart review and post-visit care was 8 minutes.      The [place] was signed into law in 2016 which includes the topic of electronic health records.  This provides immediate access to information in MyChart.  This includes consultation notes, operative notes, office notes, lab results and pathology reports.  If you have any questions about what you read please let us know at your next visit or call us at the office.  We are right here with you.   __________________________________________________________________________________     Patient Care Team    Relationship Specialty Notifications Start End  Jill Daugherty, [place] PCP - General   04/27/20   Druscilla Brownie, MD Consulting Physician Dermatology  01/11/18   Webb Laws, [place] Referring Physician Optometry  01/11/18   Celedonio Savage, PA-C Consulting Physician Dermatology  01/11/18    Comment: derm      -Vitals obtained; medications/ allergies reconciled;  personal medical, social, Sx etc.histories were updated by CMA, reviewed by me and are reflected in chart   Patient Active Problem List   Diagnosis Date Noted  . Type 2 diabetes mellitus with stage 3 chronic kidney disease ([place]) 02/05/2020  . Diabetes mellitus ([place]) 02/05/2020  . Adjustment disorder with mixed anxiety and depressed mood 02/28/2019  . Urinary incontinence in female 11/07/2018  . Gastroesophageal reflux disease 11/07/2018  . Irritable bowel syndrome with both constipation and diarrhea 09/12/2018  . Tinea corporis- lower abd folds 09/12/2018  . Type 2 diabetes mellitus with diabetic chronic kidney disease ([place]) 05/11/2018  . Acute maxillary sinusitis 04/10/2018  . Acute otitis media 04/10/2018  . Cough in adult 04/10/2018  . Noncompliance with diet  and medication regimen 03/20/2018  . Family history of early CAD- dad in early 60's 02/07/2018  . Family history of cerebrovascular accident (CVA) in mother- in her 68's 02/07/2018  . Family history of diabetes mellitus (DM)- PGM- in 40's 02/07/2018  . Family history of Alzheimer's disease- Mom in her 9 02/07/2018  . Hypertension associated with diabetes ([place]) 01/11/2018  . Mixed diabetic hyperlipidemia associated with type 2 diabetes mellitus ([place]) 01/11/2018  . Environmental and seasonal allergies 01/11/2018  . Vitamin D deficiency 01/11/2018  . Gout 06/23/2015  . DM2 (diabetes mellitus, type 2) ([place]) 06/23/2015  . GERD (gastroesophageal reflux disease) 06/23/2015  . OAB (overactive bladder) 06/23/2015  . HTN (hypertension) 06/21/2013  . Hyperlipidemia 06/21/2013  . Hypothyroidism 06/21/2013     Current Meds  Medication Sig  . allopurinol (ZYLOPRIM) 100 MG tablet Take 1 tablet (100 mg total) by mouth daily.  Marland Kitchen amLODipine (NORVASC)  5 MG tablet Take 1 tablet by mouth once daily  . aspirin EC 81 MG tablet Take 81 mg by mouth daily.  Marland Kitchen atorvastatin (LIPITOR) 40 MG tablet TAKE 1 TABLET BY MOUTH AT BEDTIME  . Calcium Carbonate (CALCIUM 600 PO) Take by mouth.  . Cholecalciferol (VITAMIN D3) 1000 units CAPS Take 2 capsules by mouth.  Arna Medici 50 MCG tablet Take 1 tablet by mouth once daily  . famotidine (PEPCID) 20 MG tablet Take 1 tablet (20 mg total) by mouth 2 (two) times daily.  . fluticasone (FLONASE) 50 MCG/ACT nasal spray USE 1 SPRAY(S) IN EACH NOSTRIL TWICE DAILY FOR 14 DAYS  . hydrochlorothiazide (HYDRODIURIL) 12.5 MG tablet Take 1 tablet by mouth once daily  . ipratropium (ATROVENT) 0.03 % nasal spray Place 2 sprays into both nostrils every 12 (twelve) hours.  Marland Kitchen loratadine (CLARITIN) 10 MG tablet Take 1 tablet (10 mg total) by mouth daily.  Marland Kitchen losartan (COZAAR) 50 MG tablet Take 1 tablet by mouth once daily  . metFORMIN (GLUCOPHAGE-XR) 500 MG 24 hr tablet Take 1 tablet by  mouth twice daily with a meal.  . Miconazole Nitrate 2 % POWD Apply to affected folds of skin twice daily only when red\ irritated  . omeprazole (PRILOSEC) 20 MG capsule TAKE 1 CAPSULE BY MOUTH ONCE DAILY AS NEEDED FOR REFLUX/HEARTBURN  . oxybutynin (DITROPAN-XL) 5 MG 24 hr tablet Take 1 tablet (5 mg total) by mouth at bedtime.  . sertraline (ZOLOFT) 25 MG tablet Take 1 tablet by mouth once daily     Allergies:  No Known Allergies   ROS:  See above HPI for pertinent positives and negatives   Objective:   Height 5\' 5"  (1.651 m), weight 159 lb (72.1 kg).  (if some vitals are omitted, this means that patient was UNABLE to obtain them. ) General: A & O * 3; sounds in no acute distress Respiratory: speaking in full sentences, no conversational dyspnea; patient confirms no use of accessory muscles Psych: insight appears good, mood- appears full

## 2021-02-03 DIAGNOSIS — H25013 Cortical age-related cataract, bilateral: Secondary | ICD-10-CM | POA: Diagnosis not present

## 2021-02-03 DIAGNOSIS — H2511 Age-related nuclear cataract, right eye: Secondary | ICD-10-CM | POA: Diagnosis not present

## 2021-02-03 DIAGNOSIS — H18413 Arcus senilis, bilateral: Secondary | ICD-10-CM | POA: Diagnosis not present

## 2021-02-03 DIAGNOSIS — H25043 Posterior subcapsular polar age-related cataract, bilateral: Secondary | ICD-10-CM | POA: Diagnosis not present

## 2021-02-03 DIAGNOSIS — H2513 Age-related nuclear cataract, bilateral: Secondary | ICD-10-CM | POA: Diagnosis not present

## 2021-02-03 DIAGNOSIS — H43821 Vitreomacular adhesion, right eye: Secondary | ICD-10-CM | POA: Diagnosis not present

## 2021-02-09 ENCOUNTER — Telehealth: Payer: Self-pay | Admitting: Physician Assistant

## 2021-02-09 DIAGNOSIS — R0981 Nasal congestion: Secondary | ICD-10-CM

## 2021-02-09 NOTE — Telephone Encounter (Signed)
Patient's husband called and states patient is still congested and sinuses are stopped up, and the nasal spray is not helping. Please advise, thanks.

## 2021-02-09 NOTE — Telephone Encounter (Signed)
Per Herb Grays suggest sending to ENT. Patient declined. Patient inquiring if she could have antibiotic.

## 2021-02-09 NOTE — Addendum Note (Signed)
Addended by: Mickel Crow on: 02/09/2021 03:46 PM   Modules accepted: Orders

## 2021-02-09 NOTE — Telephone Encounter (Signed)
See other msg. AS, CMA

## 2021-02-09 NOTE — Telephone Encounter (Signed)
Left msg for patient to call back. AS, CMA 

## 2021-02-09 NOTE — Telephone Encounter (Signed)
Vicente Males D routed conversation to You 26 minutes ago (3:15 PM)   Jones Skene [phone]  Vicente Males D 41 minutes ago (3:00 PM)     Patient has sinus and head congestion per husband, same as 2/4 visit. Please call to discuss possible treatment.    Incoming call

## 2021-02-10 NOTE — Telephone Encounter (Signed)
Per Herb Grays an antibiotic is not indicated. Patient advised to continue with nasal spray, get in hot steamy shower to help clear nasal passages and to take Claritin or Zyrtec OTC since patient reports having taken sudafed that did not help. Patient verbalized understanding and was agreeable and states she is feeling some better today. AS, CMA

## 2021-02-16 DIAGNOSIS — H2511 Age-related nuclear cataract, right eye: Secondary | ICD-10-CM | POA: Diagnosis not present

## 2021-02-17 DIAGNOSIS — H2512 Age-related nuclear cataract, left eye: Secondary | ICD-10-CM | POA: Diagnosis not present

## 2021-02-18 ENCOUNTER — Telehealth: Payer: Self-pay | Admitting: Physician Assistant

## 2021-02-18 DIAGNOSIS — E1159 Type 2 diabetes mellitus with other circulatory complications: Secondary | ICD-10-CM

## 2021-02-18 DIAGNOSIS — N1831 Chronic kidney disease, stage 3a: Secondary | ICD-10-CM

## 2021-02-18 DIAGNOSIS — I152 Hypertension secondary to endocrine disorders: Secondary | ICD-10-CM

## 2021-02-18 DIAGNOSIS — E1122 Type 2 diabetes mellitus with diabetic chronic kidney disease: Secondary | ICD-10-CM

## 2021-02-19 MED ORDER — LOSARTAN POTASSIUM 50 MG PO TABS: 50.0000 mg | ORAL_TABLET | Freq: Every day | ORAL | 0 refills | Status: DC

## 2021-02-19 MED ORDER — METFORMIN HCL ER 500 MG PO TB24: 500.0000 mg | ORAL_TABLET | Freq: Two times a day (BID) | ORAL | 0 refills | Status: DC

## 2021-02-19 NOTE — Telephone Encounter (Signed)
Refills sent to pharmacy. AS, CMA 

## 2021-02-19 NOTE — Telephone Encounter (Signed)
metformin dose to 500 mg twice daily instead of once daily to help improve glucose.

## 2021-02-23 ENCOUNTER — Telehealth: Payer: Self-pay | Admitting: Physician Assistant

## 2021-02-23 NOTE — Telephone Encounter (Signed)
Patient states bump on face has been there x 3 days. Not painful. Not draining. No streaking. Pt contacted Derm but they are unable to see patient for several weeks. Pt scheduled for Wednesday at 1030am with Heather. AS, CMA

## 2021-02-25 ENCOUNTER — Ambulatory Visit (INDEPENDENT_AMBULATORY_CARE_PROVIDER_SITE_OTHER): Payer: Medicare Other | Admitting: Nurse Practitioner

## 2021-02-25 ENCOUNTER — Other Ambulatory Visit: Payer: Self-pay

## 2021-02-25 ENCOUNTER — Encounter: Payer: Self-pay | Admitting: Nurse Practitioner

## 2021-02-25 VITALS — BP 140/69 | HR 103 | Temp 97.6°F | Ht 65.0 in | Wt 157.3 lb

## 2021-02-25 DIAGNOSIS — L209 Atopic dermatitis, unspecified: Secondary | ICD-10-CM

## 2021-02-25 DIAGNOSIS — J3481 Nasal mucositis (ulcerative): Secondary | ICD-10-CM | POA: Diagnosis not present

## 2021-02-25 DIAGNOSIS — J0110 Acute frontal sinusitis, unspecified: Secondary | ICD-10-CM | POA: Insufficient documentation

## 2021-02-25 MED ORDER — TRIAMCINOLONE ACETONIDE 0.1 % EX OINT: 1.0000 "application " | TOPICAL_OINTMENT | Freq: Two times a day (BID) | CUTANEOUS | 1 refills | Status: DC

## 2021-02-25 MED ORDER — MUPIROCIN 2 % EX OINT: 1.0000 "application " | TOPICAL_OINTMENT | Freq: Two times a day (BID) | CUTANEOUS | 1 refills | Status: DC

## 2021-02-25 MED ORDER — AMOXICILLIN 875 MG PO TABS: 875.0000 mg | ORAL_TABLET | Freq: Two times a day (BID) | ORAL | 0 refills | Status: DC

## 2021-02-25 NOTE — Progress Notes (Signed)
Acute Office Visit  Subjective:    Patient ID: Jill Daugherty, female    DOB: October 24, 1946, 75 y.o.   MRN: [id number]  Chief Complaint  Patient presents with  . Rash    HPI Patient is in today for evaluation of rash which has developed on the left side of the face. She states this started out as single, itchy bump on the left cheek which was very itchy. She states that over the past few weeks, it has gradually worsened with several small, itchy bumps which are scabbed. The lesions appear as a small cluster and is starting to spread over the cheek and down to left lower jaw line. She denies pain associated with the rash. She has not noted any drainage.  She is also c/o nasal congestion with left ear tenderness. There is also left frontal sinus tenderness and pressure. She states that these symptoms have been going on for several weeks. They are slowly getting worse. She has tried using nasal sprays, antihistamines, and decongestants. She states that none of these medications have helped. She states that her symptoms just continue to get worse.  She denies fever, chills, body aches, or pain. She denies nausea or vomiting.   Past Medical History:  Diagnosis Date  . History of chicken pox   . History of frequent urinary tract infections   . Hyperlipidemia   . Hypertension   . Overactive bladder   . Thyroid disease     History reviewed. No pertinent surgical history.  Family History  Problem Relation Age of Onset  . Heart disease Father   . Diabetes Paternal Grandmother   . Cancer Paternal Grandmother   . Breast cancer Neg Hx     Social History   Socioeconomic History  . Marital status: Married    Spouse name: Not on file  . Number of children: 1  . Years of education: 49  . Highest education level: Not on file  Occupational History  . Occupation: Retired  Tobacco Use  . Smoking status: Never Smoker  . Smokeless tobacco: Never Used  Vaping Use  . Vaping Use:  Never used  Substance and Sexual Activity  . Alcohol use: No    Alcohol/week: 0.0 standard drinks  . Drug use: No  . Sexual activity: Not Currently  Other Topics Concern  . Not on file  Social History Narrative   Regular exercise-no   Caffeine Use-yes   Social Determinants of Health   Financial Resource Strain: Not on file  Food Insecurity: Not on file  Transportation Needs: Not on file  Physical Activity: Not on file  Stress: Not on file  Social Connections: Not on file  Intimate Partner Violence: Not on file    Outpatient Medications Prior to Visit  Medication Sig Dispense Refill  . allopurinol (ZYLOPRIM) 100 MG tablet Take 1 tablet by mouth once daily 90 tablet 0  . amLODipine (NORVASC) 5 MG tablet Take 1 tablet by mouth once daily 90 tablet 0  . aspirin EC 81 MG tablet Take 81 mg by mouth daily.    Marland Kitchen atorvastatin (LIPITOR) 40 MG tablet TAKE 1 TABLET BY MOUTH AT BEDTIME 90 tablet 0  . Calcium Carbonate (CALCIUM 600 PO) Take by mouth.    . Cholecalciferol (VITAMIN D3) 1000 units CAPS Take 2 capsules by mouth.    Arna Medici 50 MCG tablet Take 1 tablet by mouth once daily 90 tablet 0  . famotidine (PEPCID) 20 MG tablet Take 1 tablet (20  mg total) by mouth 2 (two) times daily. 180 tablet 1  . fluticasone (FLONASE) 50 MCG/ACT nasal spray USE 1 SPRAY(S) IN EACH NOSTRIL TWICE DAILY FOR 14 DAYS 16 g 0  . hydrochlorothiazide (HYDRODIURIL) 12.5 MG tablet Take 1 tablet by mouth once daily 90 tablet 0  . ipratropium (ATROVENT) 0.03 % nasal spray Place 2 sprays into both nostrils every 12 (twelve) hours. 30 mL 0  . loratadine (CLARITIN) 10 MG tablet Take 1 tablet (10 mg total) by mouth daily. 90 tablet 3  . losartan (COZAAR) 50 MG tablet Take 1 tablet (50 mg total) by mouth daily. 90 tablet 0  . metFORMIN (GLUCOPHAGE-XR) 500 MG 24 hr tablet Take 1 tablet (500 mg total) by mouth 2 (two) times daily after a meal. Take 1 tablet by mouth once daily 180 tablet 0  . Miconazole Nitrate 2 % POWD  Apply to affected folds of skin twice daily only when red\ irritated 500 g 0  . omeprazole (PRILOSEC) 20 MG capsule TAKE 1 CAPSULE BY MOUTH ONCE DAILY AS NEEDED FOR REFLUX/HEARTBURN 90 capsule 0  . oxybutynin (DITROPAN-XL) 5 MG 24 hr tablet Take 1 tablet (5 mg total) by mouth at bedtime. 30 tablet 2  . sertraline (ZOLOFT) 25 MG tablet Take 1 tablet by mouth once daily 90 tablet 0  . Difluprednate 0.05 % EMUL Instill 1 drop into left eye four times a day as directed    . gatifloxacin (ZYMAXID) 0.5 % SOLN Place 1 drop into the left eye 4 (four) times daily.     No facility-administered medications prior to visit.    No Known Allergies  Review of Systems  Constitutional: Positive for fatigue. Negative for chills and fever.  HENT: Positive for congestion, ear pain, postnasal drip, rhinorrhea and sinus pressure.        Left ear pain  Respiratory: Negative for cough and stridor.   Skin: Positive for rash.       Small, itchy lesions on the left cheek and left side of the face. Rash is described as very itchy.   Allergic/Immunologic: Positive for environmental allergies.  Neurological: Positive for headaches.  All other systems reviewed and are negative.      Objective:    Physical Exam Vitals and nursing note reviewed.  Constitutional:      Appearance: Normal appearance. She is well-developed and well-nourished.  HENT:     Head: Normocephalic and atraumatic.     Right Ear: Tympanic membrane is erythematous.     Left Ear: Tympanic membrane is erythematous.     Ears:     Comments: Cerumen present in both external ear canals which is not occlusive.     Nose: Mucosal edema and congestion present.     Right Sinus: Frontal sinus tenderness present.     Left Sinus: Frontal sinus tenderness present.     Mouth/Throat:     Pharynx: Posterior oropharyngeal erythema present.  Eyes:     Pupils: Pupils are equal, round, and reactive to light.  Cardiovascular:     Rate and Rhythm: Normal rate  and regular rhythm.     Heart sounds: Normal heart sounds.  Pulmonary:     Effort: Pulmonary effort is normal.     Breath sounds: Normal breath sounds.  Abdominal:     Palpations: Abdomen is soft.     Tenderness: There is no abdominal tenderness.  Musculoskeletal:        General: Normal range of motion.     Cervical back:  Normal range of motion and neck supple.  Lymphadenopathy:     Cervical: Cervical adenopathy present.  Skin:    General: Skin is warm and dry.     Capillary Refill: Capillary refill takes less than 2 seconds.     Comments: There is cluster of rough, scabbed lesions on the left cheek. There is spread to left uper jaw line and left mastoid.   Neurological:     General: No focal deficit present.     Mental Status: She is alert and oriented to person, place, and time.  Psychiatric:        Mood and Affect: Mood and affect and mood normal.        Behavior: Behavior normal.        Thought Content: Thought content normal.        Judgment: Judgment normal.     Today's Vitals   02/25/21 1038  BP: 140/69  Pulse: (!) 103  Temp: 97.6 F (36.4 C)  SpO2: 98%  Weight: 157 lb 4.8 oz (71.4 kg)  Height: 5\' 5"  (1.651 m)   Body mass index is 26.18 kg/m.    Wt Readings from Last 3 Encounters:  02/25/21 157 lb 4.8 oz (71.4 kg)  01/30/21 159 lb (72.1 kg)  01/08/21 156 lb (70.8 kg)    Health Maintenance Due  Topic Date Due  . Hepatitis C Screening  Never done  . COVID-19 Vaccine (1) Never done  . TETANUS/TDAP  Never done  . COLONOSCOPY (Pts 45-38yrs Insurance coverage will need to be confirmed)  Never done  . PNA vac Low Risk Adult (1 of 2 - PCV13) Never done  . OPHTHALMOLOGY EXAM  12/10/2020    There are no preventive care reminders to display for this patient.   Lab Results  Component Value Date   TSH 3.470 10/06/2020   Lab Results  Component Value Date   WBC 5.9 10/06/2020   HGB 13.8 10/06/2020   HCT 41.0 10/06/2020   MCV 92 10/06/2020   PLT 236  10/06/2020   Lab Results  Component Value Date   NA 142 10/06/2020   K 4.1 10/06/2020   CO2 25 10/06/2020   GLUCOSE 140 (H) 10/06/2020   BUN 18 10/06/2020   CREATININE 1.03 (H) 10/06/2020   BILITOT 0.5 10/06/2020   ALKPHOS 79 10/06/2020   AST 23 10/06/2020   ALT 29 10/06/2020   PROT 7.1 10/06/2020   ALBUMIN 4.6 10/06/2020   CALCIUM 10.0 10/06/2020   GFR 57.65 (L) 12/23/2015   Lab Results  Component Value Date   CHOL 142 10/06/2020   Lab Results  Component Value Date   HDL 43 10/06/2020   Lab Results  Component Value Date   LDLCALC 69 10/06/2020   Lab Results  Component Value Date   TRIG 180 (H) 10/06/2020   Lab Results  Component Value Date   CHOLHDL 3.3 10/06/2020   Lab Results  Component Value Date   HGBA1C 6.6 (H) 10/06/2020       Assessment & Plan:  1. Atopic dermatitis, unspecified type Add triamcinolone ointment 0.1% ointment. This may be applied to effected areas twice daily as needed.  - triamcinolone ointment (KENALOG) 0.1 %; Apply 1 application topically 2 (two) times daily.  Dispense: 30 g; Refill: 1  2. Acute non-recurrent frontal sinusitis Stared amoxicillin 875mg  twice daily for 7 days. Encouraged her to continue with OTC medications as needed and as indicated to treat acute symptoms. Advised her to use nasal spray as  directed.  - amoxicillin (AMOXIL) 875 MG tablet; Take 1 tablet (875 mg total) by mouth 2 (two) times daily.  Dispense: 14 tablet; Refill: 0  3. Non-ulcerative nasal mucositis Add bactroban ointment. Apply small amount to inside of both nares twice daily for next 7 days.  - mupirocin ointment (BACTROBAN) 2 %; Place 1 application into the nose 2 (two) times daily.  Dispense: 22 g; Refill: 1   Problem List Items Addressed This Visit      Respiratory   Acute non-recurrent frontal sinusitis   Relevant Medications   amoxicillin (AMOXIL) 875 MG tablet     Musculoskeletal and Integument   Atopic dermatitis - Primary   Relevant  Medications   triamcinolone ointment (KENALOG) 0.1 %    Other Visit Diagnoses    Non-ulcerative nasal mucositis       Relevant Medications   mupirocin ointment (BACTROBAN) 2 %       Meds ordered this encounter  Medications  . mupirocin ointment (BACTROBAN) 2 %    Sig: Place 1 application into the nose 2 (two) times daily.    Dispense:  22 g    Refill:  1    Order Specific Question:   Supervising Provider    Answer:   Beatrice Lecher D [[id number]]  . amoxicillin (AMOXIL) 875 MG tablet    Sig: Take 1 tablet (875 mg total) by mouth 2 (two) times daily.    Dispense:  14 tablet    Refill:  0    Order Specific Question:   Supervising Provider    Answer:   Beatrice Lecher D [[id number]]  . triamcinolone ointment (KENALOG) 0.1 %    Sig: Apply 1 application topically 2 (two) times daily.    Dispense:  30 g    Refill:  1    Order Specific Question:   Supervising Provider    Answer:   Beatrice Lecher D [[id number]]   Time spent with the patient was approximately 25 minutes. This time included reviewing progress notes, labs, imaging studies, and discussing plan for follow up.   Ronnell Freshwater, NP

## 2021-02-25 NOTE — Patient Instructions (Signed)
Atopic Dermatitis Atopic dermatitis is a skin disorder that causes inflammation of the skin. It is marked by a red rash and itchy, dry, scaly skin. It is the most common type of eczema. Eczema is a group of skin conditions that cause the skin to become rough and swollen. This condition is generally worse during the cooler winter months and often improves during the warm summer months. Atopic dermatitis usually starts showing signs in infancy and can last through adulthood. This condition cannot be passed from one person to another (is not contagious). Atopic dermatitis may not always be present, but when it is, it is called a flare-up. What are the causes? The exact cause of this condition is not known. Flare-ups may be triggered by:  Coming in contact with something that you are sensitive or allergic to (allergen).  Stress.  Certain foods.  Extremely hot or cold weather.  Harsh chemicals and soaps.  Dry air.  Chlorine. What increases the risk? This condition is more likely to develop in people who have a personal or family history of:  Eczema.  Allergies.  Asthma.  Hay fever. What are the signs or symptoms? Symptoms of this condition include:  Dry, scaly skin.  Red, itchy rash.  Itchiness, which can be severe. This may occur before the skin rash. This can make sleeping difficult.  Skin thickening and cracking that can occur over time.   How is this diagnosed? This condition is diagnosed based on:  Your symptoms.  Your medical history.  A physical exam. How is this treated? There is no cure for this condition, but symptoms can usually be controlled. Treatment focuses on:  Controlling the itchiness and scratching. You may be given medicines, such as antihistamines or steroid creams.  Limiting exposure to allergens.  Recognizing situations that cause stress and developing a plan to manage stress. If your atopic dermatitis does not get better with medicines, or if  it is all over your body (widespread), a treatment using a specific type of light (phototherapy) may be used. Follow these instructions at home: Skin care  Keep your skin well moisturized. Doing this seals in moisture and helps to prevent dryness. ? Use unscented lotions that have petroleum in them. ? Avoid lotions that contain alcohol or water. They can dry the skin.  Keep baths or showers short (less than 5 minutes) in warm water. Do not use hot water. ? Use mild, unscented cleansers for bathing. Avoid soap and bubble bath. ? Apply a moisturizer to your skin right after a bath or shower.  Do not apply anything to your skin without checking with your health care provider.   General instructions  Take or apply over-the-counter and prescription medicines only as told by your health care provider.  Dress in clothes made of cotton or cotton blends. Dress lightly because heat increases itchiness.  When washing your clothes, rinse your clothes twice so all of the soap is removed.  Avoid any triggers that can cause a flare-up.  Keep your fingernails cut short.  Avoid scratching. Scratching makes the rash and itchiness worse. A break in the skin from scratching could result in a skin infection (impetigo).  Do not be around people who have cold sores or fever blisters. If you get the infection, it may cause your atopic dermatitis to worsen.  Keep all follow-up visits. This is important. Contact a health care provider if:  Your itchiness interferes with sleep.  Your rash gets worse or is not better within   one week of starting treatment.  You have a fever.  You have a rash flare-up after having contact with someone who has cold sores or fever blisters. Get help right away if:  You develop pus or soft yellow scabs in the rash area. Summary  Atopic dermatitis causes a red rash and itchy, dry, scaly skin.  Treatment focuses on controlling the itchiness and scratching, limiting  exposure to things that you are sensitive or allergic to (allergens), recognizing situations that cause stress, and developing a plan to manage stress.  Keep your skin well moisturized.  Keep baths or showers shorter than 5 minutes and use warm water. Do not use hot water. This information is not intended to replace advice given to you by your health care provider. Make sure you discuss any questions you have with your health care provider. Document Revised: 09/22/2020 Document Reviewed: 09/22/2020 Elsevier Patient Education  2021 [employer] Inc[employer]  

## 2021-03-16 DIAGNOSIS — H2512 Age-related nuclear cataract, left eye: Secondary | ICD-10-CM | POA: Diagnosis not present

## 2021-03-31 ENCOUNTER — Other Ambulatory Visit: Payer: Self-pay | Admitting: Physician Assistant

## 2021-03-31 DIAGNOSIS — N1831 Chronic kidney disease, stage 3a: Secondary | ICD-10-CM

## 2021-03-31 DIAGNOSIS — E1122 Type 2 diabetes mellitus with diabetic chronic kidney disease: Secondary | ICD-10-CM

## 2021-04-08 ENCOUNTER — Ambulatory Visit (INDEPENDENT_AMBULATORY_CARE_PROVIDER_SITE_OTHER): Payer: Medicare Other | Admitting: Nurse Practitioner

## 2021-04-08 ENCOUNTER — Other Ambulatory Visit: Payer: Self-pay

## 2021-04-08 ENCOUNTER — Encounter: Payer: Self-pay | Admitting: Nurse Practitioner

## 2021-04-08 VITALS — BP 134/73 | HR 99 | Temp 97.9°F | Ht 65.0 in | Wt 160.1 lb

## 2021-04-08 DIAGNOSIS — I152 Hypertension secondary to endocrine disorders: Secondary | ICD-10-CM | POA: Diagnosis not present

## 2021-04-08 DIAGNOSIS — N1831 Chronic kidney disease, stage 3a: Secondary | ICD-10-CM

## 2021-04-08 DIAGNOSIS — K580 Irritable bowel syndrome with diarrhea: Secondary | ICD-10-CM

## 2021-04-08 DIAGNOSIS — L209 Atopic dermatitis, unspecified: Secondary | ICD-10-CM | POA: Diagnosis not present

## 2021-04-08 DIAGNOSIS — E1159 Type 2 diabetes mellitus with other circulatory complications: Secondary | ICD-10-CM | POA: Diagnosis not present

## 2021-04-08 DIAGNOSIS — E1122 Type 2 diabetes mellitus with diabetic chronic kidney disease: Secondary | ICD-10-CM | POA: Diagnosis not present

## 2021-04-08 MED ORDER — DICYCLOMINE HCL 10 MG PO CAPS: 10.0000 mg | ORAL_CAPSULE | Freq: Two times a day (BID) | ORAL | 1 refills | Status: DC | PRN

## 2021-04-08 NOTE — Patient Instructions (Signed)

## 2021-04-08 NOTE — Progress Notes (Signed)
Established Patient Office Visit  Subjective:  Patient ID: Jill Daugherty, female    DOB: 04-May-1946  Age: 75 y.o. MRN: [id number]  CC:  Chief Complaint  Patient presents with  . Rash    HPI Jill Daugherty presents evaluation of persistent rash on the left side of her face. She initially presented for hthis in early March 2022. Rash started out as single, itchy bump on the left cheek which was very itchy. She states that over the past few weeks, it had gradually worsened with several small, itchy bumps which are scabbed. She was prescribed amoxicillin and given triamcinolone to se twice daily on effected areas. The rash has mostly resolved. There is a small, circular area on the left cheek, just in front of the left ear. It is dry, rough, and scabed. Not itchy or inflamed. Area just doesn't seem to be getting better.  She states that she is now having intermittent abdominal cramping with spells of diarrhea. This is happening mostly after eating. She states that she is sometimes afraid to go out to eat because she will have to use the bathroom before she gets home. This has resulted in her having an episode or two of incontinence. She states that she does not want to have imaging studies or labs done because of this. Wants to know if there is something she can take something to help with symptoms. She denies nausea, vomiting, fever, or body aches, or chills.   Past Medical History:  Diagnosis Date  . History of chicken pox   . History of frequent urinary tract infections   . Hyperlipidemia   . Hypertension   . Overactive bladder   . Thyroid disease     History reviewed. No pertinent surgical history.  Family History  Problem Relation Age of Onset  . Heart disease Father   . Diabetes Paternal Grandmother   . Cancer Paternal Grandmother   . Breast cancer Neg Hx     Social History   Socioeconomic History  . Marital status: Married    Spouse name: Not on file   . Number of children: 1  . Years of education: 57  . Highest education level: Not on file  Occupational History  . Occupation: Retired  Tobacco Use  . Smoking status: Never Smoker  . Smokeless tobacco: Never Used  Vaping Use  . Vaping Use: Never used  Substance and Sexual Activity  . Alcohol use: No    Alcohol/week: 0.0 standard drinks  . Drug use: No  . Sexual activity: Not Currently  Other Topics Concern  . Not on file  Social History Narrative   Regular exercise-no   Caffeine Use-yes   Social Determinants of Health   Financial Resource Strain: Not on file  Food Insecurity: Not on file  Transportation Needs: Not on file  Physical Activity: Not on file  Stress: Not on file  Social Connections: Not on file  Intimate Partner Violence: Not on file    Outpatient Medications Prior to Visit  Medication Sig Dispense Refill  . allopurinol (ZYLOPRIM) 100 MG tablet Take 1 tablet by mouth once daily 90 tablet 0  . amLODipine (NORVASC) 5 MG tablet Take 1 tablet by mouth once daily 90 tablet 0  . aspirin EC 81 MG tablet Take 81 mg by mouth daily.    Marland Kitchen atorvastatin (LIPITOR) 40 MG tablet TAKE 1 TABLET BY MOUTH AT BEDTIME 90 tablet 0  . Calcium Carbonate (CALCIUM 600 PO) Take by mouth.    Marland Kitchen  Cholecalciferol (VITAMIN D3) 1000 units CAPS Take 2 capsules by mouth.    . Difluprednate 0.05 % EMUL Instill 1 drop into left eye four times a day as directed    . EUTHYROX 50 MCG tablet Take 1 tablet by mouth once daily 90 tablet 0  . famotidine (PEPCID) 20 MG tablet Take 1 tablet (20 mg total) by mouth 2 (two) times daily. 180 tablet 1  . fluticasone (FLONASE) 50 MCG/ACT nasal spray USE 1 SPRAY(S) IN EACH NOSTRIL TWICE DAILY FOR 14 DAYS 16 g 0  . gatifloxacin (ZYMAXID) 0.5 % SOLN Place 1 drop into the left eye 4 (four) times daily.    . hydrochlorothiazide (HYDRODIURIL) 12.5 MG tablet Take 1 tablet by mouth once daily 90 tablet 0  . ipratropium (ATROVENT) 0.03 % nasal spray Place 2 sprays into  both nostrils every 12 (twelve) hours. 30 mL 0  . loratadine (CLARITIN) 10 MG tablet Take 1 tablet (10 mg total) by mouth daily. 90 tablet 3  . losartan (COZAAR) 50 MG tablet Take 1 tablet (50 mg total) by mouth daily. (Patient taking differently: Take 50 mg by mouth daily. Pt reports taking BID) 90 tablet 0  . metFORMIN (GLUCOPHAGE-XR) 500 MG 24 hr tablet Take 1 tablet by mouth once daily 90 tablet 0  . Miconazole Nitrate 2 % POWD Apply to affected folds of skin twice daily only when red\ irritated 500 g 0  . mupirocin ointment (BACTROBAN) 2 % Place 1 application into the nose 2 (two) times daily. 22 g 1  . omeprazole (PRILOSEC) 20 MG capsule TAKE 1 CAPSULE BY MOUTH ONCE DAILY AS NEEDED FOR REFLUX/HEARTBURN 90 capsule 0  . oxybutynin (DITROPAN-XL) 5 MG 24 hr tablet Take 1 tablet (5 mg total) by mouth at bedtime. 30 tablet 2  . sertraline (ZOLOFT) 25 MG tablet Take 1 tablet by mouth once daily 90 tablet 0  . triamcinolone ointment (KENALOG) 0.1 % Apply 1 application topically 2 (two) times daily. 30 g 1  . amoxicillin (AMOXIL) 875 MG tablet Take 1 tablet (875 mg total) by mouth 2 (two) times daily. (Patient not taking: Reported on 04/08/2021) 14 tablet 0   No facility-administered medications prior to visit.    No Known Allergies  ROS Review of Systems  Constitutional: Positive for appetite change. Negative for chills and fever.       States that she is not eating aas much or going out to eat as much because she is afraid she will not get to the bathroom in time and have a bowel movement.   HENT: Negative for congestion, postnasal drip, rhinorrhea and sinus pressure.   Eyes: Negative.   Respiratory: Negative for cough, shortness of breath and wheezing.   Cardiovascular: Negative for chest pain and palpitations.  Gastrointestinal: Positive for diarrhea. Negative for constipation, nausea and vomiting.       Intestinal cramping.   Endocrine: Negative.   Musculoskeletal: Negative for  arthralgias, back pain and myalgias.  Skin: Negative for rash.       Unhealing rash on left ide of face.   Allergic/Immunologic: Negative for environmental allergies.  Neurological: Negative for dizziness, weakness and headaches.  Psychiatric/Behavioral: The patient is nervous/anxious.   All other systems reviewed and are negative.     Objective:    Physical Exam Vitals and nursing note reviewed.  Constitutional:      Appearance: Normal appearance. She is well-developed.  HENT:     Head: Normocephalic and atraumatic.     Nose:  Nose normal.  Eyes:     Extraocular Movements: Extraocular movements intact.     Conjunctiva/sclera: Conjunctivae normal.     Pupils: Pupils are equal, round, and reactive to light.  Neck:     Vascular: No carotid bruit.  Cardiovascular:     Rate and Rhythm: Normal rate and regular rhythm.     Pulses: Normal pulses.     Heart sounds: Normal heart sounds.  Pulmonary:     Effort: Pulmonary effort is normal.     Breath sounds: Normal breath sounds.  Abdominal:     General: Bowel sounds are increased.     Palpations: Abdomen is soft.     Tenderness: There is generalized abdominal tenderness.     Comments: Mild, generalized abdominal tenderness with palpation of the abdomen.   Musculoskeletal:        General: Normal range of motion.     Cervical back: Normal range of motion and neck supple.  Skin:    General: Skin is warm and dry.     Capillary Refill: Capillary refill takes less than 2 seconds.     Comments: Small, round-shaped, rough area of skin, ust proximal of the left ear. Skin is currently intact and there is no drainage present. There is no redness or warmth or evidence of infection present.   Neurological:     General: No focal deficit present.     Mental Status: She is alert and oriented to person, place, and time.  Psychiatric:        Mood and Affect: Mood normal.        Behavior: Behavior normal.        Thought Content: Thought content  normal.        Judgment: Judgment normal.     Today's Vitals   04/08/21 1425  BP: 134/73  Pulse: 99  Temp: 97.9 F (36.6 C)  SpO2: 97%  Weight: 160 lb 1.6 oz (72.6 kg)  Height: 5\' 5"  (1.651 m)   Body mass index is 26.64 kg/m.    Wt Readings from Last 3 Encounters:  04/08/21 160 lb 1.6 oz (72.6 kg)  02/25/21 157 lb 4.8 oz (71.4 kg)  01/30/21 159 lb (72.1 kg)     Health Maintenance Due  Topic Date Due  . Hepatitis C Screening  Never done  . COVID-19 Vaccine (1) Never done  . TETANUS/TDAP  Never done  . COLONOSCOPY (Pts 45-63yrs Insurance coverage will need to be confirmed)  Never done  . PNA vac Low Risk Adult (1 of 2 - PCV13) Never done  . OPHTHALMOLOGY EXAM  12/10/2020  . HEMOGLOBIN A1C  04/06/2021    There are no preventive care reminders to display for this patient.  Lab Results  Component Value Date   TSH 3.470 10/06/2020   Lab Results  Component Value Date   WBC 5.9 10/06/2020   HGB 13.8 10/06/2020   HCT 41.0 10/06/2020   MCV 92 10/06/2020   PLT 236 10/06/2020   Lab Results  Component Value Date   NA 142 10/06/2020   K 4.1 10/06/2020   CO2 25 10/06/2020   GLUCOSE 140 (H) 10/06/2020   BUN 18 10/06/2020   CREATININE 1.03 (H) 10/06/2020   BILITOT 0.5 10/06/2020   ALKPHOS 79 10/06/2020   AST 23 10/06/2020   ALT 29 10/06/2020   PROT 7.1 10/06/2020   ALBUMIN 4.6 10/06/2020   CALCIUM 10.0 10/06/2020   GFR 57.65 (L) 12/23/2015   Lab Results  Component Value Date  CHOL 142 10/06/2020   Lab Results  Component Value Date   HDL 43 10/06/2020   Lab Results  Component Value Date   LDLCALC 69 10/06/2020   Lab Results  Component Value Date   TRIG 180 (H) 10/06/2020   Lab Results  Component Value Date   CHOLHDL 3.3 10/06/2020   Lab Results  Component Value Date   HGBA1C 6.6 (H) 10/06/2020      Assessment & Plan:  1. Atopic dermatitis, unspecified type Improved, however, there is small area, just proximal of the left ear. Recommend  she consult with dermatologist. She states that she currently sees a dermatologist and will make appointment.   2. Irritable bowel syndrome with diarrhea Patient declines ultrasound or imaging studies at this time. Will do trial of dicyclomine 10mg  up to twice daily as needed for intestinal cramping and diarrhea. encouraged her to keep a food journal to see if there is correlation between her symptoms and any particular food.  - dicyclomine (BENTYL) 10 MG capsule; Take 1 capsule (10 mg total) by mouth 2 (two) times daily as needed for spasms.  Dispense: 60 capsule; Refill: 1  3. Hypertension associated with diabetes ([place]) Stable. Continue bp medication as prescribed   4. Type 2 diabetes mellitus with stage 3a chronic kidney disease, without long-term current use of insulin ([place]) Continue diabetic medication as prescribed. Appointment should be made for routine follow up.   Problem List Items Addressed This Visit      Cardiovascular and Mediastinum   Hypertension associated with diabetes ([place])     Digestive   Irritable bowel syndrome with diarrhea   Relevant Medications   dicyclomine (BENTYL) 10 MG capsule     Endocrine   Type 2 diabetes mellitus with stage 3 chronic kidney disease ([facility])     Musculoskeletal and Integument   Atopic dermatitis - Primary      Meds ordered this encounter  Medications  . dicyclomine (BENTYL) 10 MG capsule    Sig: Take 1 capsule (10 mg total) by mouth 2 (two) times daily as needed for spasms.    Dispense:  60 capsule    Refill:  1    Order Specific Question:   Supervising Provider    Answer:   Beatrice Lecher D [[id number]]   Time spent with the patient was approximately 25 minutes. This time included reviewing progress notes, labs, imaging studies, and discussing plan for follow up.   Follow-up: Return in about 6 weeks (around 05/20/2021) for routine - check HgbA1c .    Ronnell Freshwater, NP

## 2021-04-09 ENCOUNTER — Other Ambulatory Visit: Payer: Self-pay | Admitting: Family Medicine

## 2021-04-09 DIAGNOSIS — K219 Gastro-esophageal reflux disease without esophagitis: Secondary | ICD-10-CM

## 2021-04-13 ENCOUNTER — Other Ambulatory Visit: Payer: Self-pay | Admitting: Physician Assistant

## 2021-04-13 DIAGNOSIS — E1159 Type 2 diabetes mellitus with other circulatory complications: Secondary | ICD-10-CM

## 2021-04-13 DIAGNOSIS — I152 Hypertension secondary to endocrine disorders: Secondary | ICD-10-CM

## 2021-04-15 ENCOUNTER — Telehealth: Payer: Self-pay | Admitting: Nurse Practitioner

## 2021-04-15 DIAGNOSIS — I152 Hypertension secondary to endocrine disorders: Secondary | ICD-10-CM

## 2021-04-15 DIAGNOSIS — E1159 Type 2 diabetes mellitus with other circulatory complications: Secondary | ICD-10-CM

## 2021-04-15 DIAGNOSIS — N1831 Chronic kidney disease, stage 3a: Secondary | ICD-10-CM

## 2021-04-15 DIAGNOSIS — E1122 Type 2 diabetes mellitus with diabetic chronic kidney disease: Secondary | ICD-10-CM

## 2021-04-15 MED ORDER — METFORMIN HCL ER 500 MG PO TB24: 500.0000 mg | ORAL_TABLET | Freq: Every day | ORAL | 0 refills | Status: DC

## 2021-04-15 MED ORDER — LOSARTAN POTASSIUM 50 MG PO TABS: 1.0000 | ORAL_TABLET | Freq: Every day | ORAL | 0 refills | Status: DC

## 2021-04-15 NOTE — Addendum Note (Signed)
Addended by: Mickel Crow on: 04/15/2021 10:01 AM   Modules accepted: Orders

## 2021-04-15 NOTE — Telephone Encounter (Signed)
Med refill sent to requested pharmacy. AS, CMA

## 2021-04-15 NOTE — Telephone Encounter (Signed)
Patient's husband states patient needs refills on Losartan and Metformin and uses [facility] on [address]. [name]

## 2021-04-29 ENCOUNTER — Other Ambulatory Visit: Payer: Self-pay | Admitting: Physician Assistant

## 2021-04-29 DIAGNOSIS — E1159 Type 2 diabetes mellitus with other circulatory complications: Secondary | ICD-10-CM

## 2021-04-29 DIAGNOSIS — I152 Hypertension secondary to endocrine disorders: Secondary | ICD-10-CM

## 2021-04-29 DIAGNOSIS — E1169 Type 2 diabetes mellitus with other specified complication: Secondary | ICD-10-CM

## 2021-05-21 ENCOUNTER — Ambulatory Visit: Payer: Medicare Other | Admitting: Physician Assistant

## 2021-06-03 ENCOUNTER — Encounter: Payer: Self-pay | Admitting: Nurse Practitioner

## 2021-06-03 ENCOUNTER — Ambulatory Visit (INDEPENDENT_AMBULATORY_CARE_PROVIDER_SITE_OTHER): Payer: Medicare Other | Admitting: Nurse Practitioner

## 2021-06-03 ENCOUNTER — Other Ambulatory Visit: Payer: Self-pay

## 2021-06-03 VITALS — BP 133/68 | HR 73 | Temp 98.2°F | Ht 65.0 in | Wt 157.7 lb

## 2021-06-03 DIAGNOSIS — I152 Hypertension secondary to endocrine disorders: Secondary | ICD-10-CM | POA: Diagnosis not present

## 2021-06-03 DIAGNOSIS — K580 Irritable bowel syndrome with diarrhea: Secondary | ICD-10-CM

## 2021-06-03 DIAGNOSIS — E1159 Type 2 diabetes mellitus with other circulatory complications: Secondary | ICD-10-CM

## 2021-06-03 DIAGNOSIS — K219 Gastro-esophageal reflux disease without esophagitis: Secondary | ICD-10-CM

## 2021-06-03 DIAGNOSIS — L209 Atopic dermatitis, unspecified: Secondary | ICD-10-CM

## 2021-06-03 LAB — POCT GLYCOSYLATED HEMOGLOBIN (HGB A1C): Hemoglobin A1C: 6.3 % — AB (ref 4.0–5.6)

## 2021-06-03 MED ORDER — OMEPRAZOLE 20 MG PO CPDR: DELAYED_RELEASE_CAPSULE | ORAL | 1 refills | Status: DC

## 2021-06-03 MED ORDER — LOSARTAN POTASSIUM 50 MG PO TABS: 2.0000 | ORAL_TABLET | Freq: Every day | ORAL | 1 refills | Status: DC

## 2021-06-03 MED ORDER — DICYCLOMINE HCL 10 MG PO CAPS: 10.0000 mg | ORAL_CAPSULE | Freq: Two times a day (BID) | ORAL | 1 refills | Status: DC | PRN

## 2021-06-03 NOTE — Progress Notes (Signed)
Established Patient Office Visit  Subjective:  Patient ID: Jill Daugherty, female    DOB: [birth year]  Age: 75 y.o. MRN: [id number]  CC:  Chief Complaint  Patient presents with  . Follow-up  . Diabetes    HPI Jill Daugherty presents for follow up visit for diabetes. Check of her HgbA1c is 6.3 today. She states that she is doing well. Takes her medication as she should. States that she had eye exam in 11/2020. Since then, she has had cataract surgery on both eyes. Right eye was doe in February 2022 and left eye was done in March 2022. She states that she did very well with the surgeries. Still needs to wear reading glasses.  Was started on dicyclomine to take as needed for ibs with diarrhea. She states that this does really help for the most part. Still has a a day, here and there, when she still has intestinal cramping and diarrhea. She states that she also has episodes of bloating and flatulence. She states that this is often malodorous.  Her blood pressure is well managed. She does take losartan 100mg  daily. Prescription will need to be updated to reflect this change. She denies other concerns or complaints today. She denies chest pain, chest pressure, or shortness of breath. HShe denies headaches or visual disturbances. She denies abdominal pain, nausea, vomiting, or changes in bowel or bladder habits.   Past Medical History:  Diagnosis Date  . History of chicken pox   . History of frequent urinary tract infections   . Hyperlipidemia   . Hypertension   . Overactive bladder   . Thyroid disease     History reviewed. No pertinent surgical history.  Family History  Problem Relation Age of Onset  . Heart disease Father   . Diabetes Paternal Grandmother   . Cancer Paternal Grandmother   . Breast cancer Neg Hx     Social History   Socioeconomic History  . Marital status: Married    Spouse name: Not on file  . Number of children: 1  . Years of education: 57   . Highest education level: Not on file  Occupational History  . Occupation: Retired  Tobacco Use  . Smoking status: Never Smoker  . Smokeless tobacco: Never Used  Vaping Use  . Vaping Use: Never used  Substance and Sexual Activity  . Alcohol use: No    Alcohol/week: 0.0 standard drinks  . Drug use: No  . Sexual activity: Not Currently  Other Topics Concern  . Not on file  Social History Narrative   Regular exercise-no   Caffeine Use-yes   Social Determinants of Health   Financial Resource Strain: Not on file  Food Insecurity: Not on file  Transportation Needs: Not on file  Physical Activity: Not on file  Stress: Not on file  Social Connections: Not on file  Intimate Partner Violence: Not on file    Outpatient Medications Prior to Visit  Medication Sig Dispense Refill  . allopurinol (ZYLOPRIM) 100 MG tablet Take 1 tablet by mouth once daily 90 tablet 0  . amLODipine (NORVASC) 5 MG tablet Take 1 tablet by mouth once daily 90 tablet 0  . aspirin EC 81 MG tablet Take 81 mg by mouth daily.    Marland Kitchen atorvastatin (LIPITOR) 40 MG tablet TAKE 1 TABLET BY MOUTH AT BEDTIME 90 tablet 0  . Calcium Carbonate (CALCIUM 600 PO) Take by mouth.    . Cholecalciferol (VITAMIN D3) 1000 units CAPS Take 2 capsules  by mouth.    . Difluprednate 0.05 % EMUL Instill 1 drop into left eye four times a day as directed    . EUTHYROX 50 MCG tablet Take 1 tablet by mouth once daily 90 tablet 0  . famotidine (PEPCID) 20 MG tablet Take 1 tablet (20 mg total) by mouth 2 (two) times daily. 180 tablet 1  . fluticasone (FLONASE) 50 MCG/ACT nasal spray USE 1 SPRAY(S) IN EACH NOSTRIL TWICE DAILY FOR 14 DAYS 16 g 0  . hydrochlorothiazide (HYDRODIURIL) 12.5 MG tablet Take 1 tablet by mouth once daily 90 tablet 0  . ipratropium (ATROVENT) 0.03 % nasal spray Place 2 sprays into both nostrils every 12 (twelve) hours. 30 mL 0  . loratadine (CLARITIN) 10 MG tablet Take 1 tablet (10 mg total) by mouth daily. 90 tablet 3  .  metFORMIN (GLUCOPHAGE-XR) 500 MG 24 hr tablet Take 1 tablet (500 mg total) by mouth daily. 90 tablet 0  . Miconazole Nitrate 2 % POWD Apply to affected folds of skin twice daily only when red\ irritated 500 g 0  . mupirocin ointment (BACTROBAN) 2 % Place 1 application into the nose 2 (two) times daily. 22 g 1  . oxybutynin (DITROPAN-XL) 5 MG 24 hr tablet Take 1 tablet (5 mg total) by mouth at bedtime. 30 tablet 2  . sertraline (ZOLOFT) 25 MG tablet Take 1 tablet by mouth once daily 90 tablet 0  . triamcinolone ointment (KENALOG) 0.1 % Apply 1 application topically 2 (two) times daily. 30 g 1  . dicyclomine (BENTYL) 10 MG capsule Take 1 capsule (10 mg total) by mouth 2 (two) times daily as needed for spasms. 60 capsule 1  . losartan (COZAAR) 50 MG tablet Take 1 tablet (50 mg total) by mouth daily. 90 tablet 0  . omeprazole (PRILOSEC) 20 MG capsule TAKE 1 CAPSULE BY MOUTH ONCE DAILY AS NEEDED FOR REFLUX/HEARTBURN 90 capsule 0  . gatifloxacin (ZYMAXID) 0.5 % SOLN Place 1 drop into the left eye 4 (four) times daily. (Patient not taking: Reported on 06/03/2021)     No facility-administered medications prior to visit.    No Known Allergies  ROS Review of Systems  Constitutional: Negative for chills, fatigue and fever.  HENT: Negative for congestion, postnasal drip, rhinorrhea, sinus pressure and sinus pain.   Eyes: Negative.   Respiratory: Negative for cough, chest tightness, shortness of breath and wheezing.   Cardiovascular: Negative for chest pain and palpitations.       Blood pressure well managed with current medication.   Gastrointestinal: Negative for constipation.       Intermittent gas and bloating. Sometimes this can be malodorous.   Endocrine: Negative for cold intolerance, heat intolerance, polydipsia and polyuria.       Blood sugars doing well   Musculoskeletal: Negative for back pain and myalgias.  Skin: Negative for rash.       Has area of concern on her face for which she has  dermatology appointment in July   Allergic/Immunologic: Negative.   Neurological: Negative.  Negative for dizziness, weakness and headaches.  Psychiatric/Behavioral: The patient is not nervous/anxious.       Objective:    Physical Exam Vitals and nursing note reviewed.  Constitutional:      Appearance: Normal appearance. She is well-developed.  HENT:     Head: Normocephalic and atraumatic.     Nose: Nose normal.  Eyes:     Extraocular Movements: Extraocular movements intact.     Conjunctiva/sclera: Conjunctivae normal.  Pupils: Pupils are equal, round, and reactive to light.  Neck:     Vascular: No carotid bruit.  Cardiovascular:     Rate and Rhythm: Normal rate and regular rhythm.     Pulses: Normal pulses.     Heart sounds: Normal heart sounds.  Pulmonary:     Effort: Pulmonary effort is normal.     Breath sounds: Normal breath sounds.  Abdominal:     Palpations: Abdomen is soft.  Musculoskeletal:        General: Normal range of motion.     Cervical back: Normal range of motion and neck supple.  Skin:    General: Skin is warm and dry.  Neurological:     General: No focal deficit present.     Mental Status: She is alert and oriented to person, place, and time.  Psychiatric:        Mood and Affect: Mood normal.        Behavior: Behavior normal.        Thought Content: Thought content normal.        Judgment: Judgment normal.     Today's Vitals   06/03/21 1129  BP: 133/68  Pulse: 73  Temp: 98.2 F (36.8 C)  SpO2: 96%  Weight: 157 lb 11.2 oz (71.5 kg)   Body mass index is 26.24 kg/m.   Wt Readings from Last 3 Encounters:  06/03/21 157 lb 11.2 oz (71.5 kg)  04/08/21 160 lb 1.6 oz (72.6 kg)  02/25/21 157 lb 4.8 oz (71.4 kg)     Health Maintenance Due  Topic Date Due  . COVID-19 Vaccine (1) Never done  . Pneumococcal Vaccine 84-86 Years old (1 of 4 - PCV13) Never done  . Hepatitis C Screening  Never done  . TETANUS/TDAP  Never done  . Zoster  Vaccines- Shingrix (1 of 2) Never done  . PNA vac Low Risk Adult (1 of 2 - PCV13) Never done  . FOOT EXAM  06/03/2021    There are no preventive care reminders to display for this patient.  Lab Results  Component Value Date   TSH 3.470 10/06/2020   Lab Results  Component Value Date   WBC 5.9 10/06/2020   HGB 13.8 10/06/2020   HCT 41.0 10/06/2020   MCV 92 10/06/2020   PLT 236 10/06/2020   Lab Results  Component Value Date   NA 142 10/06/2020   K 4.1 10/06/2020   CO2 25 10/06/2020   GLUCOSE 140 (H) 10/06/2020   BUN 18 10/06/2020   CREATININE 1.03 (H) 10/06/2020   BILITOT 0.5 10/06/2020   ALKPHOS 79 10/06/2020   AST 23 10/06/2020   ALT 29 10/06/2020   PROT 7.1 10/06/2020   ALBUMIN 4.6 10/06/2020   CALCIUM 10.0 10/06/2020   GFR 57.65 (L) 12/23/2015   Lab Results  Component Value Date   CHOL 142 10/06/2020   Lab Results  Component Value Date   HDL 43 10/06/2020   Lab Results  Component Value Date   LDLCALC 69 10/06/2020   Lab Results  Component Value Date   TRIG 180 (H) 10/06/2020   Lab Results  Component Value Date   CHOLHDL 3.3 10/06/2020   Lab Results  Component Value Date   HGBA1C 6.3 (A) 06/03/2021      Assessment & Plan:  1. Hypertension associated with diabetes ([place]) hgba1c 6.3 today. Continue metformin as prescribed. Blood pressure well managed. Adjust losartan prescription to reflect that she is taking two tablets daily. Continue as  prescribed as blood pressure is well managed.  - POCT glycosylated hemoglobin (Hb A1C) - losartan (COZAAR) 50 MG tablet; Take 2 tablets (100 mg total) by mouth daily.  Dispense: 180 tablet; Refill: 1  2. Irritable bowel syndrome with diarrhea Improved with addition of dicyclomine 10mg  when needed. Recommended trial of OTC pre and pro biotics to help reduce gas and bloating.  - dicyclomine (BENTYL) 10 MG capsule; Take 1 capsule (10 mg total) by mouth 2 (two) times daily as needed for spasms.  Dispense: 60 capsule;  Refill: 1  3. Gastroesophageal reflux disease without esophagitis May continue omeprazole as prescribed. Refills provided.  - omeprazole (PRILOSEC) 20 MG capsule; TAKE 1 CAPSULE BY MOUTH ONCE DAILY AS NEEDED FOR REFLUX/HEARTBURN  Dispense: 90 capsule; Refill: 1  4. Atopic dermatitis, unspecified type Unchanged. Patient has appointment with dermatology in July for further evaluation and treatment   Problem List Items Addressed This Visit      Cardiovascular and Mediastinum   Hypertension associated with diabetes ([place]) - Primary   Relevant Medications   losartan (COZAAR) 50 MG tablet   Other Relevant Orders   POCT glycosylated hemoglobin (Hb A1C) (Completed)     Digestive   Irritable bowel syndrome with diarrhea   Relevant Medications   dicyclomine (BENTYL) 10 MG capsule   omeprazole (PRILOSEC) 20 MG capsule   Gastroesophageal reflux disease   Relevant Medications   dicyclomine (BENTYL) 10 MG capsule   omeprazole (PRILOSEC) 20 MG capsule     Musculoskeletal and Integument   Atopic dermatitis      Meds ordered this encounter  Medications  . dicyclomine (BENTYL) 10 MG capsule    Sig: Take 1 capsule (10 mg total) by mouth 2 (two) times daily as needed for spasms.    Dispense:  60 capsule    Refill:  1    Order Specific Question:   Supervising Provider    Answer:   Beatrice Lecher D [[id number]]  . losartan (COZAAR) 50 MG tablet    Sig: Take 2 tablets (100 mg total) by mouth daily.    Dispense:  180 tablet    Refill:  1    Patient taking two tablets daily    Order Specific Question:   Supervising Provider    Answer:   Beatrice Lecher D [[id number]]  . omeprazole (PRILOSEC) 20 MG capsule    Sig: TAKE 1 CAPSULE BY MOUTH ONCE DAILY AS NEEDED FOR REFLUX/HEARTBURN    Dispense:  90 capsule    Refill:  1    Order Specific Question:   Supervising Provider    Answer:   Beatrice Lecher D [[id number]]    Follow-up: Return in about 4 months (around 10/03/2021) for medicare welness -  FBW a week prior with HgbA1c .    Ronnell Freshwater, NP

## 2021-06-03 NOTE — Patient Instructions (Signed)
[employer] Current Therapy 2021 (pp. 213-216). [place], [place]: Elsevier.">  Gastroesophageal Reflux Disease, Adult Gastroesophageal reflux (GER) happens when acid from the stomach flows up into the tube that connects the mouth and the stomach (esophagus). Normally, food travels down the esophagus and stays in the stomach to be digested. However, when a person has GER, food and stomach acid sometimes move back up into the esophagus. If this becomes a more serious problem, the person may be diagnosed with a disease called gastroesophageal reflux disease (GERD). GERD occurs when the reflux:  Happens often.  Causes frequent or severe symptoms.  Causes problems such as damage to the esophagus. When stomach acid comes in contact with the esophagus, the acid may cause inflammation in the esophagus. Over time, GERD may create small holes (ulcers) in the lining of the esophagus. What are the causes? This condition is caused by a problem with the muscle between the esophagus and the stomach (lower esophageal sphincter, or LES). Normally, the LES muscle closes after food passes through the esophagus to the stomach. When the LES is weakened or abnormal, it does not close properly, and that allows food and stomach acid to go back up into the esophagus. The LES can be weakened by certain dietary substances, medicines, and medical conditions, including:  Tobacco use.  Pregnancy.  Having a hiatal hernia.  Alcohol use.  Certain foods and beverages, such as coffee, chocolate, onions, and peppermint. What increases the risk? You are more likely to develop this condition if you:  Have an increased body weight.  Have a connective tissue disorder.  Take NSAIDs, such as ibuprofen. What are the signs or symptoms? Symptoms of this condition include:  Heartburn.  Difficult or painful swallowing and the feeling of having a lump in the throat.  A bitter taste in the mouth.  Bad breath and having a large  amount of saliva.  Having an upset or bloated stomach and belching.  Chest pain. Different conditions can cause chest pain. Make sure you see your health care provider if you experience chest pain.  Shortness of breath or wheezing.  Ongoing (chronic) cough or a nighttime cough.  Wearing away of tooth enamel.  Weight loss. How is this diagnosed? This condition may be diagnosed based on a medical history and a physical exam. To determine if you have mild or severe GERD, your health care provider may also monitor how you respond to treatment. You may also have tests, including:  A test to examine your stomach and esophagus with a small camera (endoscopy).  A test that measures the acidity level in your esophagus.  A test that measures how much pressure is on your esophagus.  A barium swallow or modified barium swallow test to show the shape, size, and functioning of your esophagus. How is this treated? Treatment for this condition may vary depending on how severe your symptoms are. Your health care provider may recommend:  Changes to your diet.  Medicine.  Surgery. The goal of treatment is to help relieve your symptoms and to prevent complications. Follow these instructions at home: Eating and drinking  Follow a diet as recommended by your health care provider. This may involve avoiding foods and drinks such as: ? Coffee and tea, with or without caffeine. ? Drinks that contain alcohol. ? Energy drinks and sports drinks. ? Carbonated drinks or sodas. ? Chocolate and cocoa. ? Peppermint and mint flavorings. ? Garlic and onions. ? Horseradish. ? Spicy and acidic foods, including peppers, chili powder,   curry powder, vinegar, hot sauces, and barbecue sauce. ? Citrus fruit juices and citrus fruits, such as oranges, lemons, and limes. ? Tomato-based foods, such as red sauce, chili, salsa, and pizza with red sauce. ? Fried and fatty foods, such as donuts, french fries, potato  chips, and high-fat dressings. ? High-fat meats, such as hot dogs and fatty cuts of red and white meats, such as rib eye steak, sausage, ham, and bacon. ? High-fat dairy items, such as whole milk, butter, and cream cheese.  Eat small, frequent meals instead of large meals.  Avoid drinking large amounts of liquid with your meals.  Avoid eating meals during the 2-3 hours before bedtime.  Avoid lying down right after you eat.  Do not exercise right after you eat.   Lifestyle  Do not use any products that contain nicotine or tobacco. These products include cigarettes, chewing tobacco, and vaping devices, such as e-cigarettes. If you need help quitting, ask your health care provider.  Try to reduce your stress by using methods such as yoga or meditation. If you need help reducing stress, ask your health care provider.  If you are overweight, reduce your weight to an amount that is healthy for you. Ask your health care provider for guidance about a safe weight loss goal.   General instructions  Pay attention to any changes in your symptoms.  Take over-the-counter and prescription medicines only as told by your health care provider. Do not take aspirin, ibuprofen, or other NSAIDs unless your health care provider told you to take these medicines.  Wear loose-fitting clothing. Do not wear anything tight around your waist that causes pressure on your abdomen.  Raise (elevate) the head of your bed about 6 inches (15 cm). You can use a wedge to do this.  Avoid bending over if this makes your symptoms worse.  Keep all follow-up visits. This is important. Contact a health care provider if:  You have: ? New symptoms. ? Unexplained weight loss. ? Difficulty swallowing or it hurts to swallow. ? Wheezing or a persistent cough. ? A hoarse voice.  Your symptoms do not improve with treatment. Get help right away if:  You have sudden pain in your arms, neck, jaw, teeth, or back.  You  suddenly feel sweaty, dizzy, or light-headed.  You have chest pain or shortness of breath.  You vomit and the vomit is green, yellow, or black, or it looks like blood or coffee grounds.  You faint.  You have stool that is red, bloody, or black.  You cannot swallow, drink, or eat. These symptoms may represent a serious problem that is an emergency. Do not wait to see if the symptoms will go away. Get medical help right away. Call your local emergency services (911 in the [place] Do not drive yourself to the hospital. Summary  Gastroesophageal reflux happens when acid from the stomach flows up into the esophagus. GERD is a disease in which the reflux happens often, causes frequent or severe symptoms, or causes problems such as damage to the esophagus.  Treatment for this condition may vary depending on how severe your symptoms are. Your health care provider may recommend diet and lifestyle changes, medicine, or surgery.  Contact a health care provider if you have new or worsening symptoms.  Take over-the-counter and prescription medicines only as told by your health care provider. Do not take aspirin, ibuprofen, or other NSAIDs unless your health care provider told you to do so.  Keep all follow-up   visits as told by your health care provider. This is important. This information is not intended to replace advice given to you by your health care provider. Make sure you discuss any questions you have with your health care provider. Document Revised: 06/23/2020 Document Reviewed: 06/23/2020 Elsevier Patient Education  2021 Elsevier Inc.  

## 2021-06-13 ENCOUNTER — Other Ambulatory Visit: Payer: Self-pay | Admitting: Physician Assistant

## 2021-06-13 DIAGNOSIS — E039 Hypothyroidism, unspecified: Secondary | ICD-10-CM

## 2021-06-16 ENCOUNTER — Telehealth: Payer: Self-pay | Admitting: Nurse Practitioner

## 2021-06-16 NOTE — Telephone Encounter (Signed)
Patient is having trouble with allergies and was advised, to take a negative COVID test. Patient has a runny nose, and head congestion. Thanks

## 2021-06-17 ENCOUNTER — Encounter: Payer: Self-pay | Admitting: Nurse Practitioner

## 2021-06-17 ENCOUNTER — Ambulatory Visit (INDEPENDENT_AMBULATORY_CARE_PROVIDER_SITE_OTHER): Payer: Medicare Other | Admitting: Nurse Practitioner

## 2021-06-17 VITALS — Wt 157.0 lb

## 2021-06-17 DIAGNOSIS — J309 Allergic rhinitis, unspecified: Secondary | ICD-10-CM | POA: Diagnosis not present

## 2021-06-17 DIAGNOSIS — J0110 Acute frontal sinusitis, unspecified: Secondary | ICD-10-CM | POA: Diagnosis not present

## 2021-06-17 MED ORDER — AMOXICILLIN 875 MG PO TABS: 875.0000 mg | ORAL_TABLET | Freq: Two times a day (BID) | ORAL | 0 refills | Status: DC

## 2021-06-17 NOTE — Progress Notes (Signed)
Virtual Visit via Telephone Note  I connected with Jill Daugherty on 06/18/21 at  2:50 PM EDT by telephone and verified that I am speaking with the correct person using two identifiers.  Location: Patient: home Provider: [facility]     I discussed the limitations, risks, security and privacy concerns of performing an evaluation and management service by telephone and the availability of in person appointments. I also discussed with the patient that there may be a patient responsible charge related to this service. The patient expressed understanding and agreed to proceed.   History of Present Illness: The patient states that she is having severe allergy type symptoms. She states that she is unable to breathe through her nose at all. She has headache and has noted a little "swimmy headedness" when changing positions. She states that when symptoms started, she started taking Claritin daily. This has helped a little, but she feels like symptoms are just getting worse. She states that she is also using an over the counter nasal spray which helps as well. Symptoms have been present for about 3 days and are gradually getting worse. She denies fever, chills, or body aches. She denies nausea, vomiting, or changes in her appetite. Patient was asked if she had been tested for COVID 19. She states that she had not and that she did not have COVID 19.    Observations/Objective:  The patient is alert and oriented. She is pleasant and answers all questions appropriately. Breathing is non-labored. She is in no acute distress at this time.  She is nasally congested.   Assessment and Plan: 1. Acute non-recurrent frontal sinusitis Start amoxicillin 875mg  twice daily for 7 days. Advised her to rest and increase fluids. She should take over the counter medications as needed and as indicated to relieve acute symptoms. Symptoms of COVID 19 were reviewed with the patient. She was  encouraged to be tested again, especially if symptoms worsened or additional symptoms developed over the next few days. She voiced understanding and agreement.  - amoxicillin (AMOXIL) 875 MG tablet; Take 1 tablet (875 mg total) by mouth 2 (two) times daily.  Dispense: 14 tablet; Refill: 0  2. Allergic rhinitis, unspecified seasonality, unspecified trigger Advised her to continue with claritin daily. She should continue to use nasal spray as needed for congestion.    Follow Up Instructions:    I discussed the assessment and treatment plan with the patient. The patient was provided an opportunity to ask questions and all were answered. The patient agreed with the plan and demonstrated an understanding of the instructions.   The patient was advised to call back or seek an in-person evaluation if the symptoms worsen or if the condition fails to improve as anticipated.  I provided 15 minutes of non-face-to-face time during this encounter.   Ronnell Freshwater, NP

## 2021-06-18 DIAGNOSIS — J309 Allergic rhinitis, unspecified: Secondary | ICD-10-CM | POA: Insufficient documentation

## 2021-06-20 ENCOUNTER — Other Ambulatory Visit: Payer: Self-pay | Admitting: Physician Assistant

## 2021-06-20 DIAGNOSIS — K582 Mixed irritable bowel syndrome: Secondary | ICD-10-CM

## 2021-06-29 ENCOUNTER — Other Ambulatory Visit: Payer: Self-pay | Admitting: Physician Assistant

## 2021-06-29 DIAGNOSIS — E1159 Type 2 diabetes mellitus with other circulatory complications: Secondary | ICD-10-CM

## 2021-06-29 DIAGNOSIS — I152 Hypertension secondary to endocrine disorders: Secondary | ICD-10-CM

## 2021-06-30 ENCOUNTER — Other Ambulatory Visit: Payer: Self-pay | Admitting: Nurse Practitioner

## 2021-06-30 ENCOUNTER — Telehealth: Payer: Self-pay | Admitting: Nurse Practitioner

## 2021-06-30 DIAGNOSIS — J0110 Acute frontal sinusitis, unspecified: Secondary | ICD-10-CM

## 2021-06-30 MED ORDER — AZITHROMYCIN 250 MG PO TABS: ORAL_TABLET | ORAL | 0 refills | Status: DC

## 2021-06-30 NOTE — Progress Notes (Signed)
Will do trial of z-pack. Take as directed fpr 5 days. Sent to [facility] on [address].

## 2021-06-30 NOTE — Telephone Encounter (Signed)
Patients spouse called office stating the Amoxicillin prescribed for sinusitis is not helping and they are requesting another antibiotic to be sent to [facility] on [address].

## 2021-06-30 NOTE — Telephone Encounter (Signed)
Patient is aware and verbalized understanding. AS, CMA

## 2021-06-30 NOTE — Telephone Encounter (Signed)
Will do trial of z-pack. Take as directed fpr 5 days. Sent to [facility] on [address].

## 2021-07-07 DIAGNOSIS — L718 Other rosacea: Secondary | ICD-10-CM | POA: Diagnosis not present

## 2021-07-07 DIAGNOSIS — L570 Actinic keratosis: Secondary | ICD-10-CM | POA: Diagnosis not present

## 2021-07-07 DIAGNOSIS — L821 Other seborrheic keratosis: Secondary | ICD-10-CM | POA: Diagnosis not present

## 2021-07-11 ENCOUNTER — Other Ambulatory Visit: Payer: Self-pay | Admitting: Physician Assistant

## 2021-07-11 DIAGNOSIS — E1122 Type 2 diabetes mellitus with diabetic chronic kidney disease: Secondary | ICD-10-CM

## 2021-07-11 DIAGNOSIS — N1831 Chronic kidney disease, stage 3a: Secondary | ICD-10-CM

## 2021-07-13 ENCOUNTER — Telehealth: Payer: Self-pay | Admitting: Nurse Practitioner

## 2021-07-13 ENCOUNTER — Other Ambulatory Visit: Payer: Self-pay | Admitting: Nurse Practitioner

## 2021-07-13 DIAGNOSIS — J0110 Acute frontal sinusitis, unspecified: Secondary | ICD-10-CM

## 2021-07-13 MED ORDER — CEFUROXIME AXETIL 250 MG PO TABS: 250.0000 mg | ORAL_TABLET | Freq: Two times a day (BID) | ORAL | 0 refills | Status: DC

## 2021-07-13 MED ORDER — CEFUROXIME AXETIL 250 MG PO TABS: 500.0000 mg | ORAL_TABLET | Freq: Two times a day (BID) | ORAL | 0 refills | Status: DC

## 2021-07-13 NOTE — Progress Notes (Signed)
Corrected prescription to reflect 250mg  twice daily for 7 days. Initially sent to [facility] in [place] then corrected to send to [facility] [place].

## 2021-07-13 NOTE — Telephone Encounter (Signed)
Just recently, we did a z-pack for her. If that worked ok, we can repeat the dose of that. Please ask her and I can send in if that works for her. Thanks.

## 2021-07-13 NOTE — Telephone Encounter (Signed)
Called pt no answer mail box is full cannot LVM

## 2021-07-13 NOTE — Telephone Encounter (Signed)
Pharmacy is [facility] in [place].

## 2021-07-13 NOTE — Telephone Encounter (Signed)
If possible [facility] at [place], [place].

## 2021-07-13 NOTE — Telephone Encounter (Signed)
Prescription for ceftin 250mg  twice daily for next 7 days sent to [facility] in [place], [place]

## 2021-07-13 NOTE — Telephone Encounter (Signed)
Patient states Zpack did not work and would like something else. [facility] in [place] please. Thanks

## 2021-07-13 NOTE — Telephone Encounter (Signed)
Patient still has a sinus infection and allergies and would like another antibiotic Heather prescribed. Please advise, thanks.

## 2021-07-13 NOTE — Telephone Encounter (Signed)
Jill Daugherty from Pharmacy would like to know if the prescription sent for Ceftin needs to be 14 or 28 pills and 250 mg or 500 mg. Please advise, thanks.

## 2021-07-13 NOTE — Telephone Encounter (Signed)
I apologize. This should be 250mg  twice daily. Corrected prescription to reflect 250mg  twice daily for 7 days. Initially sent to [facility] in [place] then corrected to send to [facility] in [place]. I think it is finally correct and at the correct pharmacy.

## 2021-07-13 NOTE — Progress Notes (Signed)
Prescription for ceftin 250mg  twice daily for next 7 days sent to [facility] in [place], [place]

## 2021-07-23 ENCOUNTER — Encounter: Payer: Self-pay | Admitting: Nurse Practitioner

## 2021-07-23 ENCOUNTER — Ambulatory Visit (INDEPENDENT_AMBULATORY_CARE_PROVIDER_SITE_OTHER): Payer: Medicare Other | Admitting: Nurse Practitioner

## 2021-07-23 VITALS — Ht 65.0 in | Wt 155.0 lb

## 2021-07-23 DIAGNOSIS — J309 Allergic rhinitis, unspecified: Secondary | ICD-10-CM

## 2021-07-23 DIAGNOSIS — L2089 Other atopic dermatitis: Secondary | ICD-10-CM

## 2021-07-23 MED ORDER — LEVOCETIRIZINE DIHYDROCHLORIDE 5 MG PO TABS: 5.0000 mg | ORAL_TABLET | Freq: Every evening | ORAL | 2 refills | Status: DC

## 2021-07-23 MED ORDER — AZELASTINE HCL 0.1 % NA SOLN: 2.0000 | Freq: Two times a day (BID) | NASAL | 1 refills | Status: DC

## 2021-07-23 NOTE — Progress Notes (Signed)
Virtual Visit via Telephone Note  I connected with Jill Daugherty on 07/23/21 at 10:00 AM EDT by telephone and verified that I am speaking with the correct person using two identifiers.  Location: Patient: home Provider: [facility]     I discussed the limitations, risks, security and privacy concerns of performing an evaluation and management service by telephone and the availability of in person appointments. I also discussed with the patient that there may be a patient responsible charge related to this service. The patient expressed understanding and agreed to proceed.   History of Present Illness: The patient states that she has been unable to get rid of nephric and nasal and sinus congestion.  It is especially bad on the left side.  She states her her left side is so stopped up she just cannot breathe out of it.  She states that she blows her nose often.  She is constantly getting clear drainage from the left side.  Does not seem to help the overall sensation of being stopped up.  Since then she 2022, she has been treated with 3 separate rounds of antibiotics for sinusitis.  That has helped more severe symptoms that are associated with infection, but the congestion has continued.  She is currently using Flonase nasal spray and Atrovent nasal spray.  She takes Claritin daily.  States that she has seen an ENT provider in the past.  States she did several tests and they looked in her nose.  She was told it was just allergies.  Today she denies fever, headache, chills, or body aches.  She denies sore throat.  She denies cough or shortness of breath.  She denies chest pain or chest pressure.  She denies nausea, vomiting, or diarrhea. She states that she was able to see that dermatologist for concerning lesion on her face.  She was diagnosed with rosacea.  She is currently undergoing treatment for that.  She will follow-up with dermatology in September.    Observations/Objective:  The patient is alert and oriented. She is pleasant and answers all questions appropriately. Breathing is non-labored. She is in no acute distress at this time.  She is nasally congested.  Today's Vitals   07/23/21 0913  Weight: 155 lb (70.3 kg)  Height: '5\' 5"'$  (1.651 m)   Body mass index is 25.79 kg/m.   Assessment and Plan: 1. Chronic allergic rhinitis Will have patient continue use of Atrovent nasal spray and Claritin.  We will start levocetirizine 5 mg tablets daily in the evenings.  We will add Astelin nasal spray.  She should use 2 sprays in both nostrils twice daily.  Continue to use Flonase nasal spray 2 sprays in both nostrils daily.  Will reassess at next visit. - levocetirizine (XYZAL) 5 MG tablet; Take 1 tablet (5 mg total) by mouth every evening.  Dispense: 30 tablet; Refill: 2 - azelastine (ASTELIN) 0.1 % nasal spray; Place 2 sprays into both nostrils 2 (two) times daily. Use in each nostril as directed  Dispense: 30 mL; Refill: 1  2. Other atopic dermatitis Now diagnosed with rosacea per dermatology.  Follow-up as scheduled.  Follow Up Instructions:    I discussed the assessment and treatment plan with the patient. The patient was provided an opportunity to ask questions and all were answered. The patient agreed with the plan and demonstrated an understanding of the instructions.   The patient was advised to call back or seek an in-person evaluation if the  symptoms worsen or if the condition fails to improve as anticipated.  I provided 10 minutes of non-face-to-face time during this encounter.   Ronnell Freshwater, NP

## 2021-08-13 ENCOUNTER — Ambulatory Visit (INDEPENDENT_AMBULATORY_CARE_PROVIDER_SITE_OTHER): Payer: Medicare Other | Admitting: Nurse Practitioner

## 2021-08-13 ENCOUNTER — Other Ambulatory Visit: Payer: Self-pay

## 2021-08-13 ENCOUNTER — Encounter: Payer: Self-pay | Admitting: Nurse Practitioner

## 2021-08-13 ENCOUNTER — Telehealth: Payer: Self-pay | Admitting: Nurse Practitioner

## 2021-08-13 VITALS — BP 119/74 | HR 90 | Temp 97.8°F | Ht 65.0 in | Wt 154.1 lb

## 2021-08-13 DIAGNOSIS — I10 Essential (primary) hypertension: Secondary | ICD-10-CM | POA: Diagnosis not present

## 2021-08-13 DIAGNOSIS — J309 Allergic rhinitis, unspecified: Secondary | ICD-10-CM

## 2021-08-13 MED ORDER — MONTELUKAST SODIUM 10 MG PO TABS: 10.0000 mg | ORAL_TABLET | Freq: Every day | ORAL | 3 refills | Status: DC

## 2021-08-13 NOTE — Progress Notes (Signed)
Acute Office Visit  Subjective:    Patient ID: Jill Daugherty, female    DOB: Feb 08, 1946, 75 y.o.   MRN: [id number]  Chief Complaint  Patient presents with   Sinus Problem    HPI Patient is in today for evaluation of persistent nasal congestion.  She states that mostly on the left side is left-sided nasal and sinus congestion.  She has been on multiple rounds of antibiotics.  Most recent visit added Astelin nasal spray.  She has continued to use Flonase nasal spray and takes Xyzal daily.  Addition of Astelin has not helped.  She states she stopped using it due to its ineffectiveness.  States that she has seen ENT provider in the past.  Was told congestion was just related to allergies.  She has not been back since then.  She denies fever, chills, body aches, or headache.  She denies feelings of fatigue.  She denies nausea or vomiting.  Past Medical History:  Diagnosis Date   History of chicken pox    History of frequent urinary tract infections    Hyperlipidemia    Hypertension    Overactive bladder    Thyroid disease     History reviewed. No pertinent surgical history.  Family History  Problem Relation Age of Onset   Heart disease Father    Diabetes Paternal Grandmother    Cancer Paternal Grandmother    Breast cancer Neg Hx     Social History   Socioeconomic History   Marital status: Married    Spouse name: Not on file   Number of children: 1   Years of education: 12   Highest education level: Not on file  Occupational History   Occupation: Retired  Tobacco Use   Smoking status: Never   Smokeless tobacco: Never  Scientific laboratory technician Use: Never used  Substance and Sexual Activity   Alcohol use: No    Alcohol/week: 0.0 standard drinks   Drug use: No   Sexual activity: Not Currently  Other Topics Concern   Not on file  Social History Narrative   Regular exercise-no   Caffeine Use-yes   Social Determinants of Health   Financial Resource Strain:  Not on file  Food Insecurity: Not on file  Transportation Needs: Not on file  Physical Activity: Not on file  Stress: Not on file  Social Connections: Not on file  Intimate Partner Violence: Not on file    Outpatient Medications Prior to Visit  Medication Sig Dispense Refill   allopurinol (ZYLOPRIM) 100 MG tablet Take 1 tablet by mouth once daily 90 tablet 0   amLODipine (NORVASC) 5 MG tablet Take 1 tablet by mouth once daily 90 tablet 0   aspirin EC 81 MG tablet Take 81 mg by mouth daily.     atorvastatin (LIPITOR) 40 MG tablet TAKE 1 TABLET BY MOUTH AT BEDTIME 90 tablet 0   azelastine (ASTELIN) 0.1 % nasal spray Place 2 sprays into both nostrils 2 (two) times daily. Use in each nostril as directed 30 mL 1   Calcium Carbonate (CALCIUM 600 PO) Take by mouth.     cefUROXime (CEFTIN) 250 MG tablet Take 1 tablet (250 mg total) by mouth 2 (two) times daily with a meal. 14 tablet 0   Cholecalciferol (VITAMIN D3) 1000 units CAPS Take 2 capsules by mouth.     dicyclomine (BENTYL) 10 MG capsule Take 1 capsule (10 mg total) by mouth 2 (two) times daily as needed for spasms. [place]  capsule 1   Difluprednate 0.05 % EMUL Instill 1 drop into left eye four times a day as directed     EUTHYROX 50 MCG tablet Take 1 tablet by mouth once daily 90 tablet 0   famotidine (PEPCID) 20 MG tablet Take 1 tablet (20 mg total) by mouth 2 (two) times daily. 180 tablet 1   fluticasone (FLONASE) 50 MCG/ACT nasal spray USE 1 SPRAY(S) IN EACH NOSTRIL TWICE DAILY FOR 14 DAYS 16 g 0   levocetirizine (XYZAL) 5 MG tablet Take 1 tablet (5 mg total) by mouth every evening. 30 tablet 2   losartan (COZAAR) 50 MG tablet Take 2 tablets (100 mg total) by mouth daily. 180 tablet 1   Miconazole Nitrate 2 % POWD Apply to affected folds of skin twice daily only when red\ irritated 500 g 0   mupirocin ointment (BACTROBAN) 2 % Place 1 application into the nose 2 (two) times daily. 22 g 1   omeprazole (PRILOSEC) 20 MG capsule TAKE 1 CAPSULE  BY MOUTH ONCE DAILY AS NEEDED FOR REFLUX/HEARTBURN 90 capsule 1   oxybutynin (DITROPAN-XL) 5 MG 24 hr tablet Take 1 tablet (5 mg total) by mouth at bedtime. 30 tablet 2   sertraline (ZOLOFT) 25 MG tablet Take 1 tablet by mouth once daily 90 tablet 0   triamcinolone ointment (KENALOG) 0.1 % Apply 1 application topically 2 (two) times daily. 30 g 1   hydrochlorothiazide (HYDRODIURIL) 12.5 MG tablet Take 1 tablet by mouth once daily 90 tablet 0   metFORMIN (GLUCOPHAGE-XR) 500 MG 24 hr tablet Take 1 tablet by mouth once daily 90 tablet 0   gatifloxacin (ZYMAXID) 0.5 % SOLN Place 1 drop into the left eye 4 (four) times daily. (Patient not taking: Reported on 08/13/2021)     No facility-administered medications prior to visit.    No Known Allergies  Review of Systems  Constitutional:  Negative for activity change, chills, fatigue and fever.  HENT:  Positive for congestion, rhinorrhea and sinus pressure. Negative for postnasal drip, sinus pain, sneezing and sore throat.   Eyes: Negative.   Respiratory:  Negative for cough, chest tightness, shortness of breath and wheezing.   Cardiovascular:  Negative for chest pain and palpitations.  Gastrointestinal:  Negative for constipation, diarrhea, nausea and vomiting.  Endocrine: Negative.   Genitourinary: Negative.   Musculoskeletal:  Negative for arthralgias, back pain and myalgias.  Skin:  Negative for rash.  Allergic/Immunologic: Positive for environmental allergies.  Neurological:  Negative for dizziness, weakness and headaches.  Psychiatric/Behavioral:  Negative for dysphoric mood.       Objective:    Physical Exam Vitals and nursing note reviewed.  Constitutional:      Appearance: Normal appearance. She is well-developed.  HENT:     Head: Normocephalic and atraumatic.     Right Ear: Tympanic membrane, ear canal and external ear normal.     Left Ear: Tympanic membrane, ear canal and external ear normal.     Nose: Congestion and rhinorrhea  present.     Left Turbinates: Enlarged.     Comments: Left frontal and maxillary pressure present without tenderness.    Mouth/Throat:     Mouth: Mucous membranes are moist.     Pharynx: Oropharynx is clear.  Eyes:     Extraocular Movements: Extraocular movements intact.     Conjunctiva/sclera: Conjunctivae normal.     Pupils: Pupils are equal, round, and reactive to light.  Cardiovascular:     Rate and Rhythm: Normal rate and regular  rhythm.     Pulses: Normal pulses.     Heart sounds: Normal heart sounds.  Pulmonary:     Effort: Pulmonary effort is normal.     Breath sounds: Normal breath sounds.  Abdominal:     Palpations: Abdomen is soft.  Musculoskeletal:        General: Normal range of motion.     Cervical back: Normal range of motion and neck supple.  Lymphadenopathy:     Cervical: No cervical adenopathy.  Skin:    General: Skin is warm and dry.     Capillary Refill: Capillary refill takes less than 2 seconds.  Neurological:     General: No focal deficit present.     Mental Status: She is alert and oriented to person, place, and time. Mental status is at baseline.  Psychiatric:        Mood and Affect: Mood normal.        Behavior: Behavior normal.        Thought Content: Thought content normal.        Judgment: Judgment normal.    Today's Vitals   08/13/21 1559  BP: 119/74  Pulse: 90  Temp: 97.8 F (36.6 C)  SpO2: 99%  Weight: 154 lb 1.6 oz (69.9 kg)   Body mass index is 25.64 kg/m.   Wt Readings from Last 3 Encounters:  08/13/21 154 lb 1.6 oz (69.9 kg)  07/23/21 155 lb (70.3 kg)  06/17/21 157 lb (71.2 kg)    Health Maintenance Due  Topic Date Due   COVID-19 Vaccine (1) Never done   Hepatitis C Screening  Never done   TETANUS/TDAP  Never done   Zoster Vaccines- Shingrix (1 of 2) Never done   PNA vac Low Risk Adult (1 of 2 - PCV13) Never done   FOOT EXAM  06/03/2021   INFLUENZA VACCINE  07/27/2021    There are no preventive care reminders to  display for this patient.   Lab Results  Component Value Date   TSH 3.470 10/06/2020   Lab Results  Component Value Date   WBC 5.9 10/06/2020   HGB 13.8 10/06/2020   HCT 41.0 10/06/2020   MCV 92 10/06/2020   PLT 236 10/06/2020   Lab Results  Component Value Date   NA 142 10/06/2020   K 4.1 10/06/2020   CO2 25 10/06/2020   GLUCOSE 140 (H) 10/06/2020   BUN 18 10/06/2020   CREATININE 1.03 (H) 10/06/2020   BILITOT 0.5 10/06/2020   ALKPHOS 79 10/06/2020   AST 23 10/06/2020   ALT 29 10/06/2020   PROT 7.1 10/06/2020   ALBUMIN 4.6 10/06/2020   CALCIUM 10.0 10/06/2020   GFR 57.65 (L) 12/23/2015   Lab Results  Component Value Date   CHOL 142 10/06/2020   Lab Results  Component Value Date   HDL 43 10/06/2020   Lab Results  Component Value Date   LDLCALC 69 10/06/2020   Lab Results  Component Value Date   TRIG 180 (H) 10/06/2020   Lab Results  Component Value Date   CHOLHDL 3.3 10/06/2020   Lab Results  Component Value Date   HGBA1C 6.3 (A) 06/03/2021       Assessment & Plan:  1. Chronic allergic rhinitis Add Singulair 10 mg tablets daily.  Continue Xyzal daily.  Use Flonase 2 sprays both nostril daily.  We will reassess at next visit. - montelukast (SINGULAIR) 10 MG tablet; Take 1 tablet (10 mg total) by mouth daily.  Dispense: 30  tablet; Refill: 3  2. Primary hypertension Stable.  Continue blood pressure medications as prescribed.  Problem List Items Addressed This Visit       Cardiovascular and Mediastinum   Primary hypertension     Respiratory   Chronic allergic rhinitis - Primary   Relevant Medications   montelukast (SINGULAIR) 10 MG tablet     Meds ordered this encounter  Medications   montelukast (SINGULAIR) 10 MG tablet    Sig: Take 1 tablet (10 mg total) by mouth daily.    Dispense:  30 tablet    Refill:  3    Order Specific Question:   Supervising Provider    Answer:   Beatrice Lecher D [[id number]]   This note was dictated using  Dragon Voice Recognition Software. Rapid proofreading was performed to expedite the delivery of the information. Despite proofreading, phonetic errors will occur which are common with this voice recognition software. Please take this into consideration. If there are any concerns, please contact our office.    Ronnell Freshwater, NP

## 2021-08-13 NOTE — Telephone Encounter (Signed)
Patient's husband called in stating patient is having sinus issues and would like to be seen. Patient took a negative covid test.Thanks

## 2021-08-20 ENCOUNTER — Other Ambulatory Visit: Payer: Self-pay | Admitting: Physician Assistant

## 2021-08-20 ENCOUNTER — Other Ambulatory Visit: Payer: Self-pay | Admitting: Nurse Practitioner

## 2021-08-20 DIAGNOSIS — N1831 Chronic kidney disease, stage 3a: Secondary | ICD-10-CM

## 2021-08-20 DIAGNOSIS — E1122 Type 2 diabetes mellitus with diabetic chronic kidney disease: Secondary | ICD-10-CM

## 2021-08-20 DIAGNOSIS — I152 Hypertension secondary to endocrine disorders: Secondary | ICD-10-CM

## 2021-08-20 DIAGNOSIS — E1159 Type 2 diabetes mellitus with other circulatory complications: Secondary | ICD-10-CM

## 2021-09-08 DIAGNOSIS — L298 Other pruritus: Secondary | ICD-10-CM | POA: Diagnosis not present

## 2021-09-08 DIAGNOSIS — I872 Venous insufficiency (chronic) (peripheral): Secondary | ICD-10-CM | POA: Diagnosis not present

## 2021-09-08 DIAGNOSIS — D225 Melanocytic nevi of trunk: Secondary | ICD-10-CM | POA: Diagnosis not present

## 2021-09-08 DIAGNOSIS — L720 Epidermal cyst: Secondary | ICD-10-CM | POA: Diagnosis not present

## 2021-09-08 DIAGNOSIS — L814 Other melanin hyperpigmentation: Secondary | ICD-10-CM | POA: Diagnosis not present

## 2021-09-08 DIAGNOSIS — L570 Actinic keratosis: Secondary | ICD-10-CM | POA: Diagnosis not present

## 2021-09-08 DIAGNOSIS — L718 Other rosacea: Secondary | ICD-10-CM | POA: Diagnosis not present

## 2021-09-08 DIAGNOSIS — L538 Other specified erythematous conditions: Secondary | ICD-10-CM | POA: Diagnosis not present

## 2021-09-08 DIAGNOSIS — L821 Other seborrheic keratosis: Secondary | ICD-10-CM | POA: Diagnosis not present

## 2021-09-08 DIAGNOSIS — L820 Inflamed seborrheic keratosis: Secondary | ICD-10-CM | POA: Diagnosis not present

## 2021-09-12 ENCOUNTER — Other Ambulatory Visit: Payer: Self-pay | Admitting: Nurse Practitioner

## 2021-09-12 DIAGNOSIS — I152 Hypertension secondary to endocrine disorders: Secondary | ICD-10-CM

## 2021-09-12 DIAGNOSIS — K582 Mixed irritable bowel syndrome: Secondary | ICD-10-CM

## 2021-09-24 ENCOUNTER — Other Ambulatory Visit: Payer: Self-pay | Admitting: Nurse Practitioner

## 2021-09-24 DIAGNOSIS — I152 Hypertension secondary to endocrine disorders: Secondary | ICD-10-CM

## 2021-09-25 ENCOUNTER — Other Ambulatory Visit: Payer: Self-pay

## 2021-09-25 DIAGNOSIS — Z0000 Encounter for general adult medical examination without abnormal findings: Secondary | ICD-10-CM

## 2021-09-25 DIAGNOSIS — E1122 Type 2 diabetes mellitus with diabetic chronic kidney disease: Secondary | ICD-10-CM

## 2021-09-25 DIAGNOSIS — N183 Chronic kidney disease, stage 3 unspecified: Secondary | ICD-10-CM

## 2021-09-25 NOTE — Progress Notes (Signed)
error 

## 2021-09-28 ENCOUNTER — Other Ambulatory Visit: Payer: Medicare Other

## 2021-09-28 DIAGNOSIS — E1122 Type 2 diabetes mellitus with diabetic chronic kidney disease: Secondary | ICD-10-CM

## 2021-09-28 DIAGNOSIS — N183 Chronic kidney disease, stage 3 unspecified: Secondary | ICD-10-CM | POA: Diagnosis not present

## 2021-09-28 DIAGNOSIS — Z0000 Encounter for general adult medical examination without abnormal findings: Secondary | ICD-10-CM | POA: Diagnosis not present

## 2021-09-29 LAB — HEMOGLOBIN A1C
Est. average glucose Bld gHb Est-mCnc: 154 mg/dL
Hgb A1c MFr Bld: 7 % — ABNORMAL HIGH (ref 4.8–5.6)

## 2021-09-29 LAB — COMPREHENSIVE METABOLIC PANEL
ALT: 36 IU/L — ABNORMAL HIGH (ref 0–32)
AST: 25 IU/L (ref 0–40)
Albumin/Globulin Ratio: 1.9 (ref 1.2–2.2)
Albumin: 4.3 g/dL (ref 3.7–4.7)
Alkaline Phosphatase: 70 IU/L (ref 44–121)
BUN/Creatinine Ratio: 16 (ref 12–28)
BUN: 16 mg/dL (ref 8–27)
Bilirubin Total: 0.4 mg/dL (ref 0.0–1.2)
CO2: 25 mmol/L (ref 20–29)
Calcium: 10 mg/dL (ref 8.7–10.3)
Chloride: 102 mmol/L (ref 96–106)
Creatinine, Ser: 0.99 mg/dL (ref 0.57–1.00)
Globulin, Total: 2.3 g/dL (ref 1.5–4.5)
Glucose: 134 mg/dL — ABNORMAL HIGH (ref 70–99)
Potassium: 4.4 mmol/L (ref 3.5–5.2)
Sodium: 143 mmol/L (ref 134–144)
Total Protein: 6.6 g/dL (ref 6.0–8.5)
eGFR: 59 mL/min/{1.73_m2} — ABNORMAL LOW (ref >59)

## 2021-09-29 LAB — CBC
Hematocrit: 41.6 % (ref 34.0–46.6)
Hemoglobin: 13.8 g/dL (ref 11.1–15.9)
MCH: 31.1 pg (ref 26.6–33.0)
MCHC: 33.2 g/dL (ref 31.5–35.7)
MCV: 94 fL (ref 79–97)
Platelets: 265 10*3/uL (ref 150–450)
RBC: 4.44 x10E6/uL (ref 3.77–5.28)
RDW: 12.8 % (ref 11.7–15.4)
WBC: 7.5 10*3/uL (ref 3.4–10.8)

## 2021-09-29 LAB — LIPID PANEL
Chol/HDL Ratio: 3.3 ratio (ref 0.0–4.4)
Cholesterol, Total: 143 mg/dL (ref 100–199)
HDL: 43 mg/dL (ref >39)
LDL Chol Calc (NIH): 66 mg/dL (ref 0–99)
Triglycerides: 203 mg/dL — ABNORMAL HIGH (ref 0–149)
VLDL Cholesterol Cal: 34 mg/dL (ref 5–40)

## 2021-09-29 LAB — TSH: TSH: 5.24 u[IU]/mL — ABNORMAL HIGH (ref 0.450–4.500)

## 2021-10-05 ENCOUNTER — Other Ambulatory Visit: Payer: Self-pay

## 2021-10-05 ENCOUNTER — Ambulatory Visit (INDEPENDENT_AMBULATORY_CARE_PROVIDER_SITE_OTHER): Payer: Medicare Other | Admitting: Nurse Practitioner

## 2021-10-05 ENCOUNTER — Encounter: Payer: Self-pay | Admitting: Nurse Practitioner

## 2021-10-05 VITALS — BP 129/67 | HR 74 | Temp 97.8°F | Ht 65.0 in | Wt 160.5 lb

## 2021-10-05 DIAGNOSIS — Z0000 Encounter for general adult medical examination without abnormal findings: Secondary | ICD-10-CM | POA: Diagnosis not present

## 2021-10-05 DIAGNOSIS — K580 Irritable bowel syndrome with diarrhea: Secondary | ICD-10-CM | POA: Diagnosis not present

## 2021-10-05 DIAGNOSIS — E782 Mixed hyperlipidemia: Secondary | ICD-10-CM | POA: Diagnosis not present

## 2021-10-05 DIAGNOSIS — I152 Hypertension secondary to endocrine disorders: Secondary | ICD-10-CM | POA: Diagnosis not present

## 2021-10-05 DIAGNOSIS — N1831 Chronic kidney disease, stage 3a: Secondary | ICD-10-CM

## 2021-10-05 DIAGNOSIS — E1122 Type 2 diabetes mellitus with diabetic chronic kidney disease: Secondary | ICD-10-CM | POA: Diagnosis not present

## 2021-10-05 DIAGNOSIS — E119 Type 2 diabetes mellitus without complications: Secondary | ICD-10-CM | POA: Diagnosis not present

## 2021-10-05 DIAGNOSIS — K582 Mixed irritable bowel syndrome: Secondary | ICD-10-CM | POA: Diagnosis not present

## 2021-10-05 DIAGNOSIS — E039 Hypothyroidism, unspecified: Secondary | ICD-10-CM | POA: Diagnosis not present

## 2021-10-05 DIAGNOSIS — Z23 Encounter for immunization: Secondary | ICD-10-CM

## 2021-10-05 DIAGNOSIS — K219 Gastro-esophageal reflux disease without esophagitis: Secondary | ICD-10-CM | POA: Diagnosis not present

## 2021-10-05 DIAGNOSIS — E1169 Type 2 diabetes mellitus with other specified complication: Secondary | ICD-10-CM

## 2021-10-05 DIAGNOSIS — E1159 Type 2 diabetes mellitus with other circulatory complications: Secondary | ICD-10-CM | POA: Diagnosis not present

## 2021-10-05 LAB — POCT GLYCOSYLATED HEMOGLOBIN (HGB A1C): Hemoglobin A1C: 6.4 % — AB (ref 4.0–5.6)

## 2021-10-05 MED ORDER — HYDROCHLOROTHIAZIDE 12.5 MG PO TABS: 12.5000 mg | ORAL_TABLET | Freq: Every day | ORAL | 1 refills | Status: DC

## 2021-10-05 MED ORDER — FAMOTIDINE 20 MG PO TABS: 20.0000 mg | ORAL_TABLET | Freq: Two times a day (BID) | ORAL | 1 refills | Status: DC

## 2021-10-05 MED ORDER — METFORMIN HCL ER 500 MG PO TB24: 500.0000 mg | ORAL_TABLET | Freq: Every day | ORAL | 1 refills | Status: DC

## 2021-10-05 MED ORDER — LEVOTHYROXINE SODIUM 75 MCG PO TABS: 75.0000 ug | ORAL_TABLET | Freq: Every day | ORAL | 1 refills | Status: DC

## 2021-10-05 MED ORDER — DICYCLOMINE HCL 10 MG PO CAPS: 10.0000 mg | ORAL_CAPSULE | Freq: Two times a day (BID) | ORAL | 1 refills | Status: DC | PRN

## 2021-10-05 MED ORDER — SERTRALINE HCL 25 MG PO TABS: 25.0000 mg | ORAL_TABLET | Freq: Every day | ORAL | 1 refills | Status: DC

## 2021-10-05 MED ORDER — LOSARTAN POTASSIUM 50 MG PO TABS: 100.0000 mg | ORAL_TABLET | Freq: Every day | ORAL | 1 refills | Status: DC

## 2021-10-05 MED ORDER — AMLODIPINE BESYLATE 5 MG PO TABS: 5.0000 mg | ORAL_TABLET | Freq: Every day | ORAL | 1 refills | Status: DC

## 2021-10-05 MED ORDER — OMEPRAZOLE 20 MG PO CPDR: DELAYED_RELEASE_CAPSULE | ORAL | 1 refills | Status: DC

## 2021-10-05 MED ORDER — ATORVASTATIN CALCIUM 40 MG PO TABS: 40.0000 mg | ORAL_TABLET | Freq: Every day | ORAL | 1 refills | Status: DC

## 2021-10-05 NOTE — Progress Notes (Signed)
Subjective:   Jill Daugherty is a 75 y.o. female who presents for Medicare Annual (Subsequent) preventive examination. She states that she has been having some trouble with blood sugars. States that they have been up and down, generally running in the 150s. She did have routine, fasting labs done prior to this visit. HgbA1c done 09/28/2021, and was 7.0. done again in the office today, it was 64, up just slightly from recent check which was 6.3. of note, her TSH was mildly elevated at 5.240. it has been trending in upward direction for the past few years. Will increase her levothyroxine to 71mcg today and recheck thyroid panel at her next visit in three months. Will also recheck Hgba1c at that time. Lipid panel looked good.   Lipid Panel     Component Value Date/Time   CHOL 143 09/28/2021 0933   TRIG 203 (H) 09/28/2021 0933   HDL 43 09/28/2021 0933   CHOLHDL 3.3 09/28/2021 0933   CHOLHDL 4 12/23/2015 1048   VLDL 44.0 (H) 12/23/2015 1048   LDLCALC 66 09/28/2021 0933   LDLDIRECT 94.0 12/23/2015 1048   LABVLDL 34 09/28/2021 0933    The patient has no physical concerns or complaints. She states that se feels wonderful. She denies chest pain, chest pressure, or shortness of breath. She denies headaches or visual disturbances. She denies abdominal pain, nausea, vomiting, or changes in bowel or bladder habits.   The patient would like to get 90-day prescriptions sent to mail order pharmacy for all routine medications filled by this office. She would like to get her flu shot during today's visit.  Review of Systems    Review of Systems  Constitutional:  Negative for fever, malaise/fatigue and weight loss.  HENT:  Negative for congestion, ear discharge, ear pain, hearing loss and sore throat.   Eyes: Negative.   Respiratory:  Negative for cough, shortness of breath and wheezing.   Cardiovascular:  Negative for chest pain and orthopnea.  Gastrointestinal:  Positive for heartburn.  Negative for abdominal pain, blood in stool, constipation, diarrhea, nausea and vomiting.  Genitourinary:  Negative for dysuria, flank pain, frequency and urgency.  Musculoskeletal:  Negative for back pain and myalgias.  Skin:  Negative for itching and rash.  Neurological:  Negative for dizziness, tingling, weakness and headaches.  Endo/Heme/Allergies:        Patient reports that blood sugars have been up and down, generally running around 150 recently.  Psychiatric/Behavioral:  Negative for depression.          Objective:   Physical Exam Vitals and nursing note reviewed.  Constitutional:      Appearance: Normal appearance. She is well-developed.  HENT:     Head: Normocephalic and atraumatic.     Right Ear: Tympanic membrane, ear canal and external ear normal.     Left Ear: Tympanic membrane, ear canal and external ear normal.     Nose: Nose normal.     Mouth/Throat:     Mouth: Mucous membranes are moist.     Pharynx: Oropharynx is clear.  Eyes:     Extraocular Movements: Extraocular movements intact.     Conjunctiva/sclera: Conjunctivae normal.     Pupils: Pupils are equal, round, and reactive to light.  Neck:     Vascular: No carotid bruit.  Cardiovascular:     Rate and Rhythm: Normal rate and regular rhythm.     Pulses:          Dorsalis pedis pulses are 1+ on the  right side and 1+ on the left side.       Posterior tibial pulses are 1+ on the right side and 1+ on the left side.     Heart sounds: Normal heart sounds.  Pulmonary:     Effort: Pulmonary effort is normal.     Breath sounds: Normal breath sounds.  Abdominal:     General: Bowel sounds are normal.     Palpations: Abdomen is soft.     Tenderness: There is no abdominal tenderness.  Musculoskeletal:        General: Normal range of motion.     Cervical back: Normal range of motion and neck supple.     Right foot: Normal range of motion. No deformity or bunion.     Left foot: Normal range of motion. No deformity  or bunion.  Feet:     Right foot:     Protective Sensation: 10 sites tested.  10 sites sensed.     Skin integrity: Skin integrity normal.     Toenail Condition: Right toenails are normal.     Left foot:     Protective Sensation: 10 sites tested.  10 sites sensed.     Skin integrity: Skin integrity normal.     Toenail Condition: Left toenails are normal.  Lymphadenopathy:     Cervical: No cervical adenopathy.  Skin:    General: Skin is warm and dry.     Capillary Refill: Capillary refill takes less than 2 seconds.  Neurological:     General: No focal deficit present.     Mental Status: She is alert and oriented to person, place, and time.     Cranial Nerves: No cranial nerve deficit.     Sensory: No sensory deficit.     Motor: No weakness.     Coordination: Coordination normal.     Gait: Gait normal.     Deep Tendon Reflexes: Reflexes normal.  Psychiatric:        Mood and Affect: Mood normal.        Behavior: Behavior normal.        Thought Content: Thought content normal.        Judgment: Judgment normal.    Today's Vitals   10/05/21 0950  BP: 129/67  Pulse: 74  Temp: 97.8 F (36.6 C)  SpO2: 98%  Weight: 160 lb 8 oz (72.8 kg)  Height: 5\' 5"  (1.651 m)   Body mass index is 26.71 kg/m.  No flowsheet data found.  Current Medications (verified) Outpatient Encounter Medications as of 10/05/2021  Medication Sig   allopurinol (ZYLOPRIM) 100 MG tablet Take 1 tablet by mouth once daily   aspirin EC 81 MG tablet Take 81 mg by mouth daily.   Calcium Carbonate (CALCIUM 600 PO) Take by mouth.   cefUROXime (CEFTIN) 250 MG tablet Take 1 tablet (250 mg total) by mouth 2 (two) times daily with a meal.   Cholecalciferol (VITAMIN D3) 1000 units CAPS Take 2 capsules by mouth.   fluticasone (FLONASE) 50 MCG/ACT nasal spray USE 1 SPRAY(S) IN EACH NOSTRIL TWICE DAILY FOR 14 DAYS   gatifloxacin (ZYMAXID) 0.5 % SOLN Place 1 drop into the left eye 4 (four) times daily.   Miconazole  Nitrate 2 % POWD Apply to affected folds of skin twice daily only when red\ irritated   mupirocin ointment (BACTROBAN) 2 % Place 1 application into the nose 2 (two) times daily.   oxybutynin (DITROPAN-XL) 5 MG 24 hr tablet Take 1 tablet (5 mg  total) by mouth at bedtime.   [DISCONTINUED] amLODipine (NORVASC) 5 MG tablet Take 1 tablet by mouth once daily   [DISCONTINUED] atorvastatin (LIPITOR) 40 MG tablet TAKE 1 TABLET BY MOUTH AT BEDTIME   [DISCONTINUED] dicyclomine (BENTYL) 10 MG capsule Take 1 capsule (10 mg total) by mouth 2 (two) times daily as needed for spasms.   [DISCONTINUED] EUTHYROX 50 MCG tablet Take 1 tablet by mouth once daily   [DISCONTINUED] famotidine (PEPCID) 20 MG tablet Take 1 tablet (20 mg total) by mouth 2 (two) times daily.   [DISCONTINUED] hydrochlorothiazide (HYDRODIURIL) 12.5 MG tablet Take 1 tablet by mouth once daily   [DISCONTINUED] levocetirizine (XYZAL) 5 MG tablet Take 1 tablet (5 mg total) by mouth every evening.   [DISCONTINUED] losartan (COZAAR) 50 MG tablet Take 2 tablets (100 mg total) by mouth daily.   [DISCONTINUED] metFORMIN (GLUCOPHAGE-XR) 500 MG 24 hr tablet Take 1 tablet by mouth once daily   [DISCONTINUED] montelukast (SINGULAIR) 10 MG tablet Take 1 tablet (10 mg total) by mouth daily.   [DISCONTINUED] omeprazole (PRILOSEC) 20 MG capsule TAKE 1 CAPSULE BY MOUTH ONCE DAILY AS NEEDED FOR REFLUX/HEARTBURN   [DISCONTINUED] sertraline (ZOLOFT) 25 MG tablet Take 1 tablet by mouth once daily   amLODipine (NORVASC) 5 MG tablet Take 1 tablet (5 mg total) by mouth daily.   atorvastatin (LIPITOR) 40 MG tablet Take 1 tablet (40 mg total) by mouth at bedtime.   azelastine (ASTELIN) 0.1 % nasal spray Place 2 sprays into both nostrils 2 (two) times daily. Use in each nostril as directed (Patient not taking: Reported on 10/05/2021)   dicyclomine (BENTYL) 10 MG capsule Take 1 capsule (10 mg total) by mouth 2 (two) times daily as needed for spasms.   Difluprednate 0.05 %  EMUL Instill 1 drop into left eye four times a day as directed (Patient not taking: Reported on 10/05/2021)   famotidine (PEPCID) 20 MG tablet Take 1 tablet (20 mg total) by mouth 2 (two) times daily.   hydrochlorothiazide (HYDRODIURIL) 12.5 MG tablet Take 1 tablet (12.5 mg total) by mouth daily.   levothyroxine (EUTHYROX) 75 MCG tablet Take 1 tablet (75 mcg total) by mouth daily.   losartan (COZAAR) 50 MG tablet Take 2 tablets (100 mg total) by mouth daily.   metFORMIN (GLUCOPHAGE-XR) 500 MG 24 hr tablet Take 1 tablet (500 mg total) by mouth daily.   omeprazole (PRILOSEC) 20 MG capsule TAKE 1 CAPSULE BY MOUTH ONCE DAILY AS NEEDED FOR REFLUX/HEARTBURN   sertraline (ZOLOFT) 25 MG tablet Take 1 tablet (25 mg total) by mouth daily.   triamcinolone ointment (KENALOG) 0.1 % Apply 1 application topically 2 (two) times daily. (Patient not taking: Reported on 10/05/2021)   [DISCONTINUED] levothyroxine (EUTHYROX) 75 MCG tablet Take 1 tablet (75 mcg total) by mouth daily.   [DISCONTINUED] levothyroxine (EUTHYROX) 75 MCG tablet Take 1 tablet (75 mcg total) by mouth daily.   No facility-administered encounter medications on file as of 10/05/2021.    Allergies (verified) Patient has no known allergies.   History: Past Medical History:  Diagnosis Date   History of chicken pox    History of frequent urinary tract infections    Hyperlipidemia    Hypertension    Overactive bladder    Thyroid disease    History reviewed. No pertinent surgical history. Family History  Problem Relation Age of Onset   Heart disease Father    Diabetes Paternal Grandmother    Cancer Paternal Grandmother    Breast cancer Neg Hx  Social History   Socioeconomic History   Marital status: Married    Spouse name: Not on file   Number of children: 1   Years of education: 12   Highest education level: Not on file  Occupational History   Occupation: Retired  Tobacco Use   Smoking status: Never   Smokeless tobacco:  Never  Vaping Use   Vaping Use: Never used  Substance and Sexual Activity   Alcohol use: No    Alcohol/week: 0.0 standard drinks   Drug use: No   Sexual activity: Not Currently  Other Topics Concern   Not on file  Social History Narrative   Regular exercise-no   Caffeine Use-yes   Social Determinants of Health   Financial Resource Strain: Not on file  Food Insecurity: Not on file  Transportation Needs: Not on file  Physical Activity: Not on file  Stress: Not on file  Social Connections: Not on file    Tobacco Counseling Patient has never smoked     Diabetic?yes      Activities of Daily Living In your present state of health, do you have any difficulty performing the following activities: 10/05/2021 07/23/2021  Hearing? N N  Vision? N N  Difficulty concentrating or making decisions? N N  Walking or climbing stairs? N N  Dressing or bathing? N N  Doing errands, shopping? N N  Some recent data might be hidden    Patient Care Team: Ronnell Freshwater, NP as PCP - General (Family Medicine) Druscilla Brownie, MD as Consulting Physician (Dermatology) Webb Laws, [place] as Referring Physician (Optometry) Celedonio Savage, PA-C as Consulting Physician (Dermatology)  Indicate any recent Medical Services you may have received from other than [facility] providers in the past year (date may be approximate).     Assessment:  1. Encounter for Medicare annual wellness exam Annual Medicare wellness visit today.  2. Type 2 diabetes mellitus without complication, without long-term current use of insulin (HCC) Hemoglobin A1c 6.4 during today's visit though was 7.0 on venipuncture done 09/28/2021.  For now, continue metformin XR 500 mg tablets twice daily.  Recommend she continue to monitor closely.  Encouraged her to limit intake of carbohydrates and sugar in her diet and increase protein intake.  We will recheck hemoglobin A1c in 3 months. - POCT glycosylated hemoglobin (Hb A1C) -  metFORMIN (GLUCOPHAGE-XR) 500 MG 24 hr tablet; Take 1 tablet (500 mg total) by mouth daily.  Dispense: 180 tablet; Refill: 1   3. Hypertension associated with diabetes ([place]) Blood pressure stable.  Continue all medications as prescribed.  And a prescription sent to [facility] mail order pharmacy. - amLODipine (NORVASC) 5 MG tablet; Take 1 tablet (5 mg total) by mouth daily.  Dispense: 90 tablet; Refill: 1 - hydrochlorothiazide (HYDRODIURIL) 12.5 MG tablet; Take 1 tablet (12.5 mg total) by mouth daily.  Dispense: 90 tablet; Refill: 1 - losartan (COZAAR) 50 MG tablet; Take 2 tablets (100 mg total) by mouth daily.  Dispense: 180 tablet; Refill: 1  4. Mixed diabetic hyperlipidemia associated with type 2 diabetes mellitus (HCC) Overall, lipid panel good.  Reviewed with patient.  Continue atorvastatin 40 mg tablets daily.  New prescription sent to [facility] mail order pharmacy. - atorvastatin (LIPITOR) 40 MG tablet; Take 1 tablet (40 mg total) by mouth at bedtime.  Dispense: 90 tablet; Refill: 1  5. Irritable bowel syndrome with diarrhea Patient may continue dicyclomine 10 mg up to twice daily if needed for intestinal cramping and diarrhea. - dicyclomine (BENTYL)  10 MG capsule; Take 1 capsule (10 mg total) by mouth 2 (two) times daily as needed for spasms.  Dispense: 180 capsule; Refill: 1  6. Hypothyroidism, unspecified type Reviewed labs with patient showing mildly elevated TSH.  Has been trending in upper direction for past 3 years.  Increase levothyroxine to 75 mcg daily.  A 30-day prescription was sent to [facility].  A 90-day prescription was sent to [facility] mail order pharmacy.  Recheck TSH and free T4 at next visit.  Adjust levothyroxine dosing as located. - levothyroxine (EUTHYROX) 75 MCG tablet; Take 1 tablet (75 mcg total) by mouth daily.  Dispense: 30 tablet; Refill: 1  7. Gastroesophageal reflux disease without esophagitis Patient may continue Prilosec 20 mg daily.  May take Pepcid 20  mg up to twice daily as needed for heartburn type symptoms.  Encouraged her to avoid triggers such as spicy foods.  She should sleep with her head elevated at 30 degrees.  We will reassess at next visit in 3 months. - famotidine (PEPCID) 20 MG tablet; Take 1 tablet (20 mg total) by mouth 2 (two) times daily.  Dispense: 180 tablet; Refill: 1 - omeprazole (PRILOSEC) 20 MG capsule; TAKE 1 CAPSULE BY MOUTH ONCE DAILY AS NEEDED FOR REFLUX/HEARTBURN  Dispense: 90 capsule; Refill: 1  8. Irritable bowel syndrome with both constipation and diarrhea Continue sertraline 25 mg tablets daily.  90-day prescription sent to [facility] mail order pharmacy. - sertraline (ZOLOFT) 25 MG tablet; Take 1 tablet (25 mg total) by mouth daily.  Dispense: 90 tablet; Refill: 1    Depression Screen PHQ 2/9 Scores 10/05/2021 07/23/2021 06/03/2021 04/08/2021 02/25/2021 01/30/2021 01/08/2021  PHQ - 2 Score 0 0 0 0 - 0 0  PHQ- 9 Score 0 0 0 0 - 0 0  Exception Documentation - - - - Patient refusal - -    Fall Risk Fall Risk  10/05/2021 07/23/2021 06/03/2021 04/08/2021 02/25/2021  Falls in the past year? 0 0 0 0 0  Number falls in past yr: 0 0 0 - -  Injury with Fall? 0 0 0 - -  Follow up Falls evaluation completed Falls evaluation completed Falls evaluation completed Falls evaluation completed Falls evaluation completed    FALL RISK PREVENTION PERTAINING TO THE HOME:  Any stairs in or around the home? No  If so, are there any without handrails? No  Home free of loose throw rugs in walkways, pet beds, electrical cords, etc? No  Adequate lighting in your home to reduce risk of falls? Yes   ASSISTIVE DEVICES UTILIZED TO PREVENT FALLS:  Life alert? No  Use of a cane, walker or w/c? No  Grab bars in the bathroom? Yes  Shower chair or bench in shower? No  Elevated toilet seat or a handicapped toilet? No   TIMED UP AND GO:  Was the test performed? Yes .  Length of time to ambulate 10 feet: 10 sec.   Gait steady and fast without  use of assistive device  Cognitive Function:     6CIT Screen 10/05/2021 10/09/2020 09/20/2019  What Year? 0 points 4 points 0 points  What month? 0 points 0 points 0 points  What time? 0 points 0 points 0 points  Count back from 20 2 points 2 points 2 points  Months in reverse 2 points 2 points 4 points  Repeat phrase 2 points 0 points 0 points  Total Score 6 8 6     Immunizations Immunization History  Administered Date(s) Administered  Fluad Quad(high Dose 65+) 10/09/2019   Influenza, High Dose Seasonal PF 10/05/2018, 10/09/2020   Influenza,inj,Quad PF,6+ Mos 09/28/2015   Influenza-Unspecified 08/27/2012, 07/27/2013, 08/27/2014    TDAP status: Due, Education has been provided regarding the importance of this vaccine. Advised may receive this vaccine at local pharmacy or Health Dept. Aware to provide a copy of the vaccination record if obtained from local pharmacy or Health Dept. Verbalized acceptance and understanding.  Flu Vaccine status: Up to date  Pneumococcal vaccine status: Due, Education has been provided regarding the importance of this vaccine. Advised may receive this vaccine at local pharmacy or Health Dept. Aware to provide a copy of the vaccination record if obtained from local pharmacy or Health Dept. Verbalized acceptance and understanding.  Covid-19 vaccine status: Completed vaccines  Qualifies for Shingles Vaccine? Yes   Zostavax completed No   Shingrix Completed?: No.    Education has been provided regarding the importance of this vaccine. Patient has been advised to call insurance company to determine out of pocket expense if they have not yet received this vaccine. Advised may also receive vaccine at local pharmacy or Health Dept. Verbalized acceptance and understanding.  Screening Tests Health Maintenance  Topic Date Due   COVID-19 Vaccine (1) Never done   Hepatitis C Screening  Never done   TETANUS/TDAP  Never done   Zoster Vaccines- Shingrix (1 of 2)  Never done   INFLUENZA VACCINE  07/27/2021   OPHTHALMOLOGY EXAM  12/24/2021   HEMOGLOBIN A1C  04/05/2022   Fecal DNA (Cologuard)  09/30/2022   FOOT EXAM  10/05/2022   DEXA SCAN  Completed   HPV VACCINES  Aged Out    Health Maintenance  Health Maintenance Due  Topic Date Due   COVID-19 Vaccine (1) Never done   Hepatitis C Screening  Never done   TETANUS/TDAP  Never done   Zoster Vaccines- Shingrix (1 of 2) Never done   INFLUENZA VACCINE  07/27/2021    Colorectal cancer screening: Type of screening: Cologuard. Completed 2020. Repeat every 0 years  Mammogram status: No longer required due to age out.  Bone Density status: Completed 2021. Results reflect: Bone density results: NORMAL. Repeat every 3 years.  Lung Cancer Screening: (Low Dose CT Chest recommended if Age 74-80 years, 30 pack-year currently smoking OR have quit w/in 15years.) does not qualify.   Lung Cancer Screening Referral: n/a  Additional Screening:  Hepatitis C Screening: does not qualify; Completed 0   Vision Screening: Recommended annual ophthalmology exams for early detection of glaucoma and other disorders of the eye. Is the patient up to date with their annual eye exam?  Yes  Who is the provider or what is the name of the office in which the patient attends annual eye exams?  If pt is not established with a provider, would they like to be referred to a provider to establish care? No .   Dental Screening: Recommended annual dental exams for proper oral hygiene  Community Resource Referral / Chronic Care Management: CRR required this visit?  No   CCM required this visit?  No      Plan:     I have personally reviewed and noted the following in the patient's chart:   Medical and social history Use of alcohol, tobacco or illicit drugs  Current medications and supplements including opioid prescriptions.  Functional ability and status Nutritional status Physical activity Advanced  directives List of other physicians Hospitalizations, surgeries, and ER visits in previous 12 months Vitals  Screenings to include cognitive, depression, and falls Referrals and appointments  In addition, I have reviewed and discussed with patient certain preventive protocols, quality metrics, and best practice recommendations. A written personalized care plan for preventive services as well as general preventive health recommendations were provided to patient.   This note was dictated using Systems analyst. Rapid proofreading was performed to expedite the delivery of the information. Despite proofreading, phonetic errors will occur which are common with this voice recognition software. Please take this into consideration. If there are any concerns, please contact our office.     Leretha Pol, FNP-c 10/05/2021

## 2021-10-05 NOTE — Progress Notes (Signed)
Reviewed lab results with patient during visit .

## 2021-10-17 ENCOUNTER — Other Ambulatory Visit: Payer: Self-pay | Admitting: Nurse Practitioner

## 2021-10-17 DIAGNOSIS — N1831 Chronic kidney disease, stage 3a: Secondary | ICD-10-CM

## 2021-10-19 ENCOUNTER — Other Ambulatory Visit: Payer: Self-pay

## 2021-10-19 ENCOUNTER — Encounter: Payer: Self-pay | Admitting: Nurse Practitioner

## 2021-10-19 ENCOUNTER — Ambulatory Visit (INDEPENDENT_AMBULATORY_CARE_PROVIDER_SITE_OTHER): Payer: Medicare Other | Admitting: Nurse Practitioner

## 2021-10-19 VITALS — BP 117/70 | HR 73 | Temp 98.1°F | Ht 65.0 in | Wt 155.5 lb

## 2021-10-19 DIAGNOSIS — R112 Nausea with vomiting, unspecified: Secondary | ICD-10-CM | POA: Diagnosis not present

## 2021-10-19 DIAGNOSIS — E039 Hypothyroidism, unspecified: Secondary | ICD-10-CM

## 2021-10-19 DIAGNOSIS — K219 Gastro-esophageal reflux disease without esophagitis: Secondary | ICD-10-CM | POA: Diagnosis not present

## 2021-10-19 MED ORDER — PANTOPRAZOLE SODIUM 20 MG PO TBEC: 20.0000 mg | DELAYED_RELEASE_TABLET | Freq: Every day | ORAL | 1 refills | Status: DC

## 2021-10-19 MED ORDER — METOCLOPRAMIDE HCL 5 MG PO TABS: 5.0000 mg | ORAL_TABLET | Freq: Every day | ORAL | 1 refills | Status: DC | PRN

## 2021-10-19 NOTE — Progress Notes (Signed)
Acute Office Visit  Subjective:    Patient ID: Jill Daugherty, female    DOB: 1946/01/03, 75 y.o.   MRN: [id number]  Chief Complaint  Patient presents with   Emesis    The patient states that she continues to feel a knot in the center of her stomach. Has been present for some time. She states that she thinks this might be contributing. For last week or so, she has an episode of nausea with vomiting. After vomiting, she feels better. States that all other times she feels fine. Last episode was this past Saturday.   Emesis  This is a new problem. The current episode started in the past 7 days. The problem occurs intermittently (happening about every other day). The problem has been unchanged. The emesis has an appearance of bile. There has been no fever. Associated symptoms include abdominal pain and diarrhea. Pertinent negatives include no arthralgias, chest pain, chills, coughing, dizziness, fever, headaches or myalgias. She has tried nothing for the symptoms.    Past Medical History:  Diagnosis Date   History of chicken pox    History of frequent urinary tract infections    Hyperlipidemia    Hypertension    Overactive bladder    Thyroid disease     History reviewed. No pertinent surgical history.  Family History  Problem Relation Age of Onset   Heart disease Father    Diabetes Paternal Grandmother    Cancer Paternal Grandmother    Breast cancer Neg Hx     Social History   Socioeconomic History   Marital status: Married    Spouse name: Not on file   Number of children: 1   Years of education: 12   Highest education level: Not on file  Occupational History   Occupation: Retired  Tobacco Use   Smoking status: Never   Smokeless tobacco: Never  Scientific laboratory technician Use: Never used  Substance and Sexual Activity   Alcohol use: No    Alcohol/week: 0.0 standard drinks   Drug use: No   Sexual activity: Not Currently  Other Topics Concern   Not on file   Social History Narrative   Regular exercise-no   Caffeine Use-yes   Social Determinants of Health   Financial Resource Strain: Not on file  Food Insecurity: Not on file  Transportation Needs: Not on file  Physical Activity: Not on file  Stress: Not on file  Social Connections: Not on file  Intimate Partner Violence: Not on file    Outpatient Medications Prior to Visit  Medication Sig Dispense Refill   allopurinol (ZYLOPRIM) 100 MG tablet Take 1 tablet by mouth once daily 90 tablet 0   amLODipine (NORVASC) 5 MG tablet Take 1 tablet (5 mg total) by mouth daily. 90 tablet 1   aspirin EC 81 MG tablet Take 81 mg by mouth daily.     atorvastatin (LIPITOR) 40 MG tablet Take 1 tablet (40 mg total) by mouth at bedtime. 90 tablet 1   Calcium Carbonate (CALCIUM 600 PO) Take by mouth.     cefUROXime (CEFTIN) 250 MG tablet Take 1 tablet (250 mg total) by mouth 2 (two) times daily with a meal. 14 tablet 0   Cholecalciferol (VITAMIN D3) 1000 units CAPS Take 2 capsules by mouth.     dicyclomine (BENTYL) 10 MG capsule Take 1 capsule (10 mg total) by mouth 2 (two) times daily as needed for spasms. 180 capsule 1   Difluprednate 0.05 % EMUL  famotidine (PEPCID) 20 MG tablet Take 1 tablet (20 mg total) by mouth 2 (two) times daily. 180 tablet 1   fluticasone (FLONASE) 50 MCG/ACT nasal spray USE 1 SPRAY(S) IN EACH NOSTRIL TWICE DAILY FOR 14 DAYS 16 g 0   gatifloxacin (ZYMAXID) 0.5 % SOLN Place 1 drop into the left eye 4 (four) times daily.     hydrochlorothiazide (HYDRODIURIL) 12.5 MG tablet Take 1 tablet (12.5 mg total) by mouth daily. 90 tablet 1   levothyroxine (EUTHYROX) 75 MCG tablet Take 1 tablet (75 mcg total) by mouth daily. 30 tablet 1   losartan (COZAAR) 50 MG tablet Take 2 tablets (100 mg total) by mouth daily. 180 tablet 1   metFORMIN (GLUCOPHAGE-XR) 500 MG 24 hr tablet Take 1 tablet by mouth once daily 90 tablet 0   Miconazole Nitrate 2 % POWD Apply to affected folds of skin twice daily  only when red\ irritated 500 g 0   mupirocin ointment (BACTROBAN) 2 % Place 1 application into the nose 2 (two) times daily. 22 g 1   omeprazole (PRILOSEC) 20 MG capsule TAKE 1 CAPSULE BY MOUTH ONCE DAILY AS NEEDED FOR REFLUX/HEARTBURN 90 capsule 1   oxybutynin (DITROPAN-XL) 5 MG 24 hr tablet Take 1 tablet (5 mg total) by mouth at bedtime. 30 tablet 2   sertraline (ZOLOFT) 25 MG tablet Take 1 tablet (25 mg total) by mouth daily. 90 tablet 1   azelastine (ASTELIN) 0.1 % nasal spray Place 2 sprays into both nostrils 2 (two) times daily. Use in each nostril as directed (Patient not taking: No sig reported) 30 mL 1   triamcinolone ointment (KENALOG) 0.1 % Apply 1 application topically 2 (two) times daily. (Patient not taking: No sig reported) 30 g 1   No facility-administered medications prior to visit.    No Known Allergies  Review of Systems  Constitutional:  Negative for activity change, appetite change, chills, fatigue and fever.  HENT:  Negative for congestion, postnasal drip, rhinorrhea, sinus pressure, sinus pain, sneezing and sore throat.   Eyes: Negative.   Respiratory:  Negative for cough, chest tightness, shortness of breath and wheezing.   Cardiovascular:  Negative for chest pain and palpitations.  Gastrointestinal:  Positive for abdominal pain, diarrhea and vomiting. Negative for constipation and nausea.       Patient states she is having 1 episode of nausea with vomiting an hour or so after eating.  She states after vomiting she feels great.  Sometimes these episodes are accompanied with diarrhea but not always. She has been doing well with prescription for omeprazole 20 mg daily taking Pepcid on as-needed basis.  States that pharmacy is out of omeprazole and will be for several days.  She does not correlate any symptoms with being out of an appetite.  Endocrine: Negative for cold intolerance, heat intolerance, polydipsia and polyuria.  Genitourinary:  Negative for dyspareunia,  dysuria, flank pain, frequency and urgency.  Musculoskeletal:  Negative for arthralgias, back pain and myalgias.  Skin:  Negative for rash.  Allergic/Immunologic: Negative for environmental allergies.  Neurological:  Negative for dizziness, weakness and headaches.  Hematological:  Negative for adenopathy.  Psychiatric/Behavioral:  The patient is not nervous/anxious.       Objective:    Physical Exam Vitals and nursing note reviewed.  Constitutional:      Appearance: Normal appearance. She is well-developed.  HENT:     Head: Normocephalic and atraumatic.  Eyes:     Pupils: Pupils are equal, round, and reactive to  light.  Cardiovascular:     Rate and Rhythm: Normal rate and regular rhythm.     Pulses: Normal pulses.     Heart sounds: Normal heart sounds.  Pulmonary:     Effort: Pulmonary effort is normal.     Breath sounds: Normal breath sounds.  Abdominal:     General: Bowel sounds are normal. There is no distension.     Palpations: Abdomen is soft. There is no mass.     Tenderness: There is no abdominal tenderness. There is no guarding or rebound.     Hernia: No hernia is present.  Musculoskeletal:        General: Normal range of motion.     Cervical back: Normal range of motion and neck supple.  Lymphadenopathy:     Cervical: No cervical adenopathy.  Skin:    General: Skin is warm and dry.     Capillary Refill: Capillary refill takes less than 2 seconds.  Neurological:     General: No focal deficit present.     Mental Status: She is alert and oriented to person, place, and time.  Psychiatric:        Mood and Affect: Mood normal.        Behavior: Behavior normal.        Thought Content: Thought content normal.        Judgment: Judgment normal.    Today's Vitals   10/19/21 1130  BP: 117/70  Pulse: 73  Temp: 98.1 F (36.7 C)  SpO2: 98%  Weight: 155 lb 8 oz (70.5 kg)  Height: $Remove'5\' 5"'MGLRjMN$  (1.651 m)   Body mass index is 25.88 kg/m.   Wt Readings from Last 3  Encounters:  10/19/21 155 lb 8 oz (70.5 kg)  10/05/21 160 lb 8 oz (72.8 kg)  08/13/21 154 lb 1.6 oz (69.9 kg)    Health Maintenance Due  Topic Date Due   Pneumonia Vaccine 5+ Years old (1 - PCV) Never done    There are no preventive care reminders to display for this patient.   Lab Results  Component Value Date   TSH 5.240 (H) 09/28/2021   Lab Results  Component Value Date   WBC 7.5 09/28/2021   HGB 13.8 09/28/2021   HCT 41.6 09/28/2021   MCV 94 09/28/2021   PLT 265 09/28/2021   Lab Results  Component Value Date   NA 143 09/28/2021   K 4.4 09/28/2021   CO2 25 09/28/2021   GLUCOSE 134 (H) 09/28/2021   BUN 16 09/28/2021   CREATININE 0.99 09/28/2021   BILITOT 0.4 09/28/2021   ALKPHOS 70 09/28/2021   AST 25 09/28/2021   ALT 36 (H) 09/28/2021   PROT 6.6 09/28/2021   ALBUMIN 4.3 09/28/2021   CALCIUM 10.0 09/28/2021   EGFR 59 (L) 09/28/2021   GFR 57.65 (L) 12/23/2015   Lab Results  Component Value Date   CHOL 143 09/28/2021   Lab Results  Component Value Date   HDL 43 09/28/2021   Lab Results  Component Value Date   LDLCALC 66 09/28/2021   Lab Results  Component Value Date   TRIG 203 (H) 09/28/2021   Lab Results  Component Value Date   CHOLHDL 3.3 09/28/2021   Lab Results  Component Value Date   HGBA1C 6.4 (A) 10/05/2021       Assessment & Plan:  1. Nausea and vomiting, unspecified vomiting type Patient having 1 episode of nausea with vomiting now resolved after eating.  Feels fine after episode.  We will do trial Reglan 5 mg tablets once daily if needed for nausea with vomiting.  Encouraged ultrasound of epigastric area for further evaluation.  Patient has declined this as in the past.  States she will get this done if symptoms are persistent after addition of medication. - metoCLOPramide (REGLAN) 5 MG tablet; Take 1 tablet (5 mg total) by mouth daily as needed for nausea.  Dispense: 30 tablet; Refill: 1  2. Gastroesophageal reflux disease without  esophagitis Short-term prescription for pantoprazole 20 mg capsules daily.  This is to take place of omeprazole 20 mg tablets while pharmacy is out.  May continue taking Pepcid 20 mg twice daily if needed. - pantoprazole (PROTONIX) 20 MG tablet; Take 1 tablet (20 mg total) by mouth daily.  Dispense: 30 tablet; Refill: 1  3. Hypothyroidism, unspecified type Patient reports feeling well with increased energy since starting new thyroid medication.  Problem List Items Addressed This Visit       Digestive   Gastroesophageal reflux disease   Relevant Medications   pantoprazole (PROTONIX) 20 MG tablet   metoCLOPramide (REGLAN) 5 MG tablet   Nausea and vomiting - Primary   Relevant Medications   metoCLOPramide (REGLAN) 5 MG tablet     Endocrine   Hypothyroidism     Meds ordered this encounter  Medications   pantoprazole (PROTONIX) 20 MG tablet    Sig: Take 1 tablet (20 mg total) by mouth daily.    Dispense:  30 tablet    Refill:  1    This is to take place of omeprazole while waiting to get that in.    Order Specific Question:   Supervising Provider    Answer:   Beatrice Lecher D [[id number]]   metoCLOPramide (REGLAN) 5 MG tablet    Sig: Take 1 tablet (5 mg total) by mouth daily as needed for nausea.    Dispense:  30 tablet    Refill:  1    Order Specific Question:   Supervising Provider    Answer:   Beatrice Lecher D [[id number]]     Ronnell Freshwater, NP  This note was dictated using Dragon Voice Recognition Software. Rapid proofreading was performed to expedite the delivery of the information. Despite proofreading, phonetic errors will occur which are common with this voice recognition software. Please take this into consideration. If there are any concerns, please contact our office.

## 2021-10-22 ENCOUNTER — Other Ambulatory Visit: Payer: Self-pay | Admitting: Nurse Practitioner

## 2021-10-22 ENCOUNTER — Other Ambulatory Visit: Payer: Self-pay

## 2021-10-22 DIAGNOSIS — E1122 Type 2 diabetes mellitus with diabetic chronic kidney disease: Secondary | ICD-10-CM

## 2021-10-22 DIAGNOSIS — E782 Mixed hyperlipidemia: Secondary | ICD-10-CM

## 2021-10-22 DIAGNOSIS — E1169 Type 2 diabetes mellitus with other specified complication: Secondary | ICD-10-CM

## 2021-10-22 MED ORDER — METFORMIN HCL ER 500 MG PO TB24: ORAL_TABLET | ORAL | 0 refills | Status: DC

## 2021-10-22 NOTE — Progress Notes (Unsigned)
Patient states you increased her Metformin from 1 PO QD to 2 PO QD and needs more meds sent to the pharmacy.   I do not see this med adjustment in the chart. AS, CMA

## 2021-10-22 NOTE — Progress Notes (Signed)
I have just made that change and sent new prescription to [facility] [address]

## 2021-10-27 ENCOUNTER — Ambulatory Visit (INDEPENDENT_AMBULATORY_CARE_PROVIDER_SITE_OTHER): Payer: Medicare Other | Admitting: Physician Assistant

## 2021-10-27 ENCOUNTER — Encounter: Payer: Self-pay | Admitting: Physician Assistant

## 2021-10-27 VITALS — BP 135/73 | HR 78 | Temp 97.8°F | Ht 65.0 in | Wt 158.2 lb

## 2021-10-27 DIAGNOSIS — J329 Chronic sinusitis, unspecified: Secondary | ICD-10-CM

## 2021-10-27 LAB — POCT INFLUENZA A/B
Influenza A, POC: NEGATIVE
Influenza B, POC: NEGATIVE

## 2021-10-27 MED ORDER — BENZONATATE 100 MG PO CAPS: 200.0000 mg | ORAL_CAPSULE | Freq: Two times a day (BID) | ORAL | 0 refills | Status: DC | PRN

## 2021-10-27 MED ORDER — AZITHROMYCIN 250 MG PO TABS: ORAL_TABLET | ORAL | 0 refills | Status: AC

## 2021-10-27 NOTE — Progress Notes (Signed)
om  Acute Office Visit  Subjective:    Patient ID: Jill Daugherty, female    DOB: [birth year], 75 y.o.   MRN: [id number]  Chief Complaint  Patient presents with   Nasal Congestion   Cough    HPI Patient is in today for c/o dry cough, nasal congestion, headache, general malaise and sore throat. Symptoms started about 3 days ago and have gradually worsened. Tested for Covid about 2-3 weeks ago which was negative, has not retested since then. Denies sinus pressure, body aches, n/v/d, shortness of breath or wheezing.   Past Medical History:  Diagnosis Date   History of chicken pox    History of frequent urinary tract infections    Hyperlipidemia    Hypertension    Overactive bladder    Thyroid disease     History reviewed. No pertinent surgical history.  Family History  Problem Relation Age of Onset   Heart disease Father    Diabetes Paternal Grandmother    Cancer Paternal Grandmother    Breast cancer Neg Hx     Social History   Socioeconomic History   Marital status: Married    Spouse name: Not on file   Number of children: 1   Years of education: 12   Highest education level: Not on file  Occupational History   Occupation: Retired  Tobacco Use   Smoking status: Never   Smokeless tobacco: Never  Scientific laboratory technician Use: Never used  Substance and Sexual Activity   Alcohol use: No    Alcohol/week: 0.0 standard drinks   Drug use: No   Sexual activity: Not Currently  Other Topics Concern   Not on file  Social History Narrative   Regular exercise-no   Caffeine Use-yes   Social Determinants of Health   Financial Resource Strain: Not on file  Food Insecurity: Not on file  Transportation Needs: Not on file  Physical Activity: Not on file  Stress: Not on file  Social Connections: Not on file  Intimate Partner Violence: Not on file    Outpatient Medications Prior to Visit  Medication Sig Dispense Refill   allopurinol (ZYLOPRIM) 100 MG tablet Take  1 tablet by mouth once daily 90 tablet 0   amLODipine (NORVASC) 5 MG tablet Take 1 tablet (5 mg total) by mouth daily. 90 tablet 1   aspirin EC 81 MG tablet Take 81 mg by mouth daily.     atorvastatin (LIPITOR) 40 MG tablet Take 1 tablet (40 mg total) by mouth at bedtime. 90 tablet 1   azelastine (ASTELIN) 0.1 % nasal spray Place 2 sprays into both nostrils 2 (two) times daily. Use in each nostril as directed (Patient not taking: No sig reported) 30 mL 1   Calcium Carbonate (CALCIUM 600 PO) Take by mouth.     cefUROXime (CEFTIN) 250 MG tablet Take 1 tablet (250 mg total) by mouth 2 (two) times daily with a meal. 14 tablet 0   Cholecalciferol (VITAMIN D3) 1000 units CAPS Take 2 capsules by mouth.     dicyclomine (BENTYL) 10 MG capsule Take 1 capsule (10 mg total) by mouth 2 (two) times daily as needed for spasms. 180 capsule 1   Difluprednate 0.05 % EMUL      famotidine (PEPCID) 20 MG tablet Take 1 tablet (20 mg total) by mouth 2 (two) times daily. 180 tablet 1   fluticasone (FLONASE) 50 MCG/ACT nasal spray USE 1 SPRAY(S) IN EACH NOSTRIL TWICE DAILY FOR 14 DAYS 16 g  0   gatifloxacin (ZYMAXID) 0.5 % SOLN Place 1 drop into the left eye 4 (four) times daily.     hydrochlorothiazide (HYDRODIURIL) 12.5 MG tablet Take 1 tablet (12.5 mg total) by mouth daily. 90 tablet 1   levothyroxine (EUTHYROX) 75 MCG tablet Take 1 tablet (75 mcg total) by mouth daily. 30 tablet 1   losartan (COZAAR) 50 MG tablet Take 2 tablets (100 mg total) by mouth daily. 180 tablet 1   metFORMIN (GLUCOPHAGE-XR) 500 MG 24 hr tablet Take 2 tablets po qd 90 tablet 0   metoCLOPramide (REGLAN) 5 MG tablet Take 1 tablet (5 mg total) by mouth daily as needed for nausea. 30 tablet 1   Miconazole Nitrate 2 % POWD Apply to affected folds of skin twice daily only when red\ irritated 500 g 0   mupirocin ointment (BACTROBAN) 2 % Place 1 application into the nose 2 (two) times daily. 22 g 1   omeprazole (PRILOSEC) 20 MG capsule TAKE 1 CAPSULE  BY MOUTH ONCE DAILY AS NEEDED FOR REFLUX/HEARTBURN 90 capsule 1   oxybutynin (DITROPAN-XL) 5 MG 24 hr tablet Take 1 tablet (5 mg total) by mouth at bedtime. 30 tablet 2   pantoprazole (PROTONIX) 20 MG tablet Take 1 tablet (20 mg total) by mouth daily. 30 tablet 1   sertraline (ZOLOFT) 25 MG tablet Take 1 tablet (25 mg total) by mouth daily. 90 tablet 1   triamcinolone ointment (KENALOG) 0.1 % Apply 1 application topically 2 (two) times daily. (Patient not taking: No sig reported) 30 g 1   No facility-administered medications prior to visit.    No Known Allergies  Review of Systems Review of Systems:  A fourteen system review of systems was performed and found to be positive as per HPI.    Objective:    Physical Exam General:  WDWN, in no acute distress  Neuro:  Alert and oriented,  extra-ocular muscles intact  HEENT:  Normocephalic, atraumatic, no tenderness of frontal or maxillary sinus, discomfort of right temple, normal conjunctiva, normal TM's of both ears, pink and moist nasal mucosa, mild erythema of posterior oropharynx, neck supple, no adenopathy  Skin:  no gross rash, warm, pink. Cardiac:  RRR Respiratory: Rhonchi of both lower lobes noted (cleared after coughing), no wheezing, crackles or rales. Not using accessory muscles, speaking in full sentences- unlabored. Vascular:  Ext warm, no cyanosis apprec. Psych:  No HI/SI, judgement and insight good, Euthymic mood. Full Affect.  BP 135/73   Pulse 78   Temp 97.8 F (36.6 C)   Ht $R'5\' 5"'XO$  (1.651 m)   Wt 158 lb 3.2 oz (71.8 kg)   SpO2 97%   BMI 26.33 kg/m  Wt Readings from Last 3 Encounters:  10/27/21 158 lb 3.2 oz (71.8 kg)  10/19/21 155 lb 8 oz (70.5 kg)  10/05/21 160 lb 8 oz (72.8 kg)    Health Maintenance Due  Topic Date Due   COVID-19 Vaccine (1) Never done   Pneumonia Vaccine 84+ Years old (1 - PCV) Never done    There are no preventive care reminders to display for this patient.   Lab Results  Component  Value Date   TSH 5.240 (H) 09/28/2021   Lab Results  Component Value Date   WBC 7.5 09/28/2021   HGB 13.8 09/28/2021   HCT 41.6 09/28/2021   MCV 94 09/28/2021   PLT 265 09/28/2021   Lab Results  Component Value Date   NA 143 09/28/2021   K 4.4 09/28/2021  CO2 25 09/28/2021   GLUCOSE 134 (H) 09/28/2021   BUN 16 09/28/2021   CREATININE 0.99 09/28/2021   BILITOT 0.4 09/28/2021   ALKPHOS 70 09/28/2021   AST 25 09/28/2021   ALT 36 (H) 09/28/2021   PROT 6.6 09/28/2021   ALBUMIN 4.3 09/28/2021   CALCIUM 10.0 09/28/2021   EGFR 59 (L) 09/28/2021   GFR 57.65 (L) 12/23/2015   Lab Results  Component Value Date   CHOL 143 09/28/2021   Lab Results  Component Value Date   HDL 43 09/28/2021   Lab Results  Component Value Date   LDLCALC 66 09/28/2021   Lab Results  Component Value Date   TRIG 203 (H) 09/28/2021   Lab Results  Component Value Date   CHOLHDL 3.3 09/28/2021   Lab Results  Component Value Date   HGBA1C 6.4 (A) 10/05/2021       Assessment & Plan:   Problem List Items Addressed This Visit   None Visit Diagnoses     Sinusitis, unspecified chronicity, unspecified location    -  Primary   Relevant Medications   azithromycin (ZITHROMAX) 250 MG tablet   benzonatate (TESSALON) 100 MG capsule   Other Relevant Orders   POCT Influenza A/B (Completed)      Influenza swab collected in office and negative. Due to worsening symptoms with some signs of sinusitis and risk factors for severe respiratory infection will start oral antibiotic therapy with azithromycin. Discussed with patient re-testing at home for Covid, deferred in office. Recommend to continue home supportive care with warm liquids/honey, rest, steamy showers, and cough lozenges. Takes tesslon perles as needed for cough. Follow up if symptoms fail to improve or worsen.  Meds ordered this encounter  Medications   azithromycin (ZITHROMAX) 250 MG tablet    Sig: Take 2 tablets on day 1, then 1 tablet  daily on days 2 through 5    Dispense:  6 tablet    Refill:  0    Order Specific Question:   Supervising Provider    Answer:   Hali Marry [[id number]]   benzonatate (TESSALON) 100 MG capsule    Sig: Take 2 capsules (200 mg total) by mouth 2 (two) times daily as needed for cough.    Dispense:  20 capsule    Refill:  0    Order Specific Question:   Supervising Provider    Answer:   Beatrice Lecher D [[id number]]     Lorrene Reid, PA-C

## 2021-11-02 ENCOUNTER — Ambulatory Visit (INDEPENDENT_AMBULATORY_CARE_PROVIDER_SITE_OTHER): Payer: Medicare Other | Admitting: Nurse Practitioner

## 2021-11-02 ENCOUNTER — Other Ambulatory Visit: Payer: Self-pay

## 2021-11-02 ENCOUNTER — Encounter: Payer: Self-pay | Admitting: Nurse Practitioner

## 2021-11-02 VITALS — BP 131/74 | HR 77 | Temp 98.1°F | Ht 65.0 in | Wt 157.4 lb

## 2021-11-02 DIAGNOSIS — J329 Chronic sinusitis, unspecified: Secondary | ICD-10-CM | POA: Diagnosis not present

## 2021-11-02 DIAGNOSIS — E039 Hypothyroidism, unspecified: Secondary | ICD-10-CM

## 2021-11-02 DIAGNOSIS — R052 Subacute cough: Secondary | ICD-10-CM | POA: Diagnosis not present

## 2021-11-02 MED ORDER — AMOXICILLIN-POT CLAVULANATE 875-125 MG PO TABS: 1.0000 | ORAL_TABLET | Freq: Two times a day (BID) | ORAL | 0 refills | Status: DC

## 2021-11-02 MED ORDER — BENZONATATE 200 MG PO CAPS: 200.0000 mg | ORAL_CAPSULE | Freq: Two times a day (BID) | ORAL | 0 refills | Status: DC | PRN

## 2021-11-02 MED ORDER — CHERATUSSIN AC 100-10 MG/5ML PO SYRP
5.0000 mL | ORAL_SOLUTION | Freq: Three times a day (TID) | ORAL | 0 refills | Status: DC | PRN
Start: 2021-11-02 — End: 2022-08-05

## 2021-11-02 MED ORDER — LEVOTHYROXINE SODIUM 75 MCG PO TABS: 75.0000 ug | ORAL_TABLET | Freq: Every day | ORAL | 1 refills | Status: DC

## 2021-11-02 NOTE — Progress Notes (Signed)
Acute Office Visit  Subjective:    Patient ID: Jill Daugherty, female    DOB: 07-21-1946, 75 y.o.   MRN: [id number]  Chief Complaint  Patient presents with   Cough    The patient was seen and treated 10/27/2021 for acute sinusitis. Was treated with z-pack and tessalon perles. States that she didn't get any better. After finishing her antibiotics, she states that her throat started to hurt and her cough has become worse.   Cough This is a recurrent problem. The current episode started 1 to 4 weeks ago. The problem has been gradually worsening. The problem occurs every few minutes. The cough is Productive of sputum (sputum is sometimes white and other times it is green). Associated symptoms include headaches, nasal congestion, postnasal drip, rhinorrhea, a sore throat and wheezing. Pertinent negatives include no chest pain, chills, ear congestion, ear pain, fever, hemoptysis, myalgias, rash or shortness of breath. Nothing aggravates the symptoms. She has tried prescription cough suppressant and rest (treatment wiht Zpack and tessalon perls.) for the symptoms. The treatment provided no relief. There is no history of environmental allergies.    Past Medical History:  Diagnosis Date   History of chicken pox    History of frequent urinary tract infections    Hyperlipidemia    Hypertension    Overactive bladder    Thyroid disease     History reviewed. No pertinent surgical history.  Family History  Problem Relation Age of Onset   Heart disease Father    Diabetes Paternal Grandmother    Cancer Paternal Grandmother    Breast cancer Neg Hx     Social History   Socioeconomic History   Marital status: Married    Spouse name: Not on file   Number of children: 1   Years of education: 12   Highest education level: Not on file  Occupational History   Occupation: Retired  Tobacco Use   Smoking status: Never   Smokeless tobacco: Never  Scientific laboratory technician Use: Never used   Substance and Sexual Activity   Alcohol use: No    Alcohol/week: 0.0 standard drinks   Drug use: No   Sexual activity: Not Currently  Other Topics Concern   Not on file  Social History Narrative   Regular exercise-no   Caffeine Use-yes   Social Determinants of Health   Financial Resource Strain: Not on file  Food Insecurity: Not on file  Transportation Needs: Not on file  Physical Activity: Not on file  Stress: Not on file  Social Connections: Not on file  Intimate Partner Violence: Not on file    Outpatient Medications Prior to Visit  Medication Sig Dispense Refill   allopurinol (ZYLOPRIM) 100 MG tablet Take 1 tablet by mouth once daily 90 tablet 0   amLODipine (NORVASC) 5 MG tablet Take 1 tablet (5 mg total) by mouth daily. 90 tablet 1   aspirin EC 81 MG tablet Take 81 mg by mouth daily.     atorvastatin (LIPITOR) 40 MG tablet Take 1 tablet (40 mg total) by mouth at bedtime. 90 tablet 1   Calcium Carbonate (CALCIUM 600 PO) Take by mouth.     Cholecalciferol (VITAMIN D3) 1000 units CAPS Take 2 capsules by mouth.     dicyclomine (BENTYL) 10 MG capsule Take 1 capsule (10 mg total) by mouth 2 (two) times daily as needed for spasms. 180 capsule 1   Difluprednate 0.05 % EMUL      famotidine (PEPCID) 20  MG tablet Take 1 tablet (20 mg total) by mouth 2 (two) times daily. 180 tablet 1   fluticasone (FLONASE) 50 MCG/ACT nasal spray USE 1 SPRAY(S) IN EACH NOSTRIL TWICE DAILY FOR 14 DAYS 16 g 0   gatifloxacin (ZYMAXID) 0.5 % SOLN Place 1 drop into the left eye 4 (four) times daily.     hydrochlorothiazide (HYDRODIURIL) 12.5 MG tablet Take 1 tablet (12.5 mg total) by mouth daily. 90 tablet 1   losartan (COZAAR) 50 MG tablet Take 2 tablets (100 mg total) by mouth daily. 180 tablet 1   metFORMIN (GLUCOPHAGE-XR) 500 MG 24 hr tablet Take 2 tablets po qd 90 tablet 0   metoCLOPramide (REGLAN) 5 MG tablet Take 1 tablet (5 mg total) by mouth daily as needed for nausea. 30 tablet 1   Miconazole  Nitrate 2 % POWD Apply to affected folds of skin twice daily only when red\ irritated 500 g 0   mupirocin ointment (BACTROBAN) 2 % Place 1 application into the nose 2 (two) times daily. 22 g 1   omeprazole (PRILOSEC) 20 MG capsule TAKE 1 CAPSULE BY MOUTH ONCE DAILY AS NEEDED FOR REFLUX/HEARTBURN 90 capsule 1   oxybutynin (DITROPAN-XL) 5 MG 24 hr tablet Take 1 tablet (5 mg total) by mouth at bedtime. 30 tablet 2   pantoprazole (PROTONIX) 20 MG tablet Take 1 tablet (20 mg total) by mouth daily. 30 tablet 1   sertraline (ZOLOFT) 25 MG tablet Take 1 tablet (25 mg total) by mouth daily. 90 tablet 1   benzonatate (TESSALON) 100 MG capsule Take 2 capsules (200 mg total) by mouth 2 (two) times daily as needed for cough. 20 capsule 0   cefUROXime (CEFTIN) 250 MG tablet Take 1 tablet (250 mg total) by mouth 2 (two) times daily with a meal. 14 tablet 0   levothyroxine (EUTHYROX) 75 MCG tablet Take 1 tablet (75 mcg total) by mouth daily. 30 tablet 1   azelastine (ASTELIN) 0.1 % nasal spray Place 2 sprays into both nostrils 2 (two) times daily. Use in each nostril as directed (Patient not taking: No sig reported) 30 mL 1   triamcinolone ointment (KENALOG) 0.1 % Apply 1 application topically 2 (two) times daily. (Patient not taking: No sig reported) 30 g 1   No facility-administered medications prior to visit.    No Known Allergies  Review of Systems  Constitutional:  Positive for fatigue. Negative for activity change, appetite change, chills and fever.  HENT:  Positive for congestion, postnasal drip, rhinorrhea and sore throat. Negative for ear pain, sinus pressure, sinus pain and sneezing.   Eyes: Negative.   Respiratory:  Positive for cough and wheezing. Negative for hemoptysis, chest tightness and shortness of breath.   Cardiovascular:  Negative for chest pain and palpitations.  Gastrointestinal:  Negative for abdominal pain, constipation, diarrhea, nausea and vomiting.  Endocrine: Negative for cold  intolerance, heat intolerance, polydipsia and polyuria.  Genitourinary:  Negative for dyspareunia, dysuria, flank pain, frequency and urgency.  Musculoskeletal:  Negative for arthralgias, back pain and myalgias.  Skin:  Negative for rash.  Allergic/Immunologic: Negative for environmental allergies.  Neurological:  Positive for headaches. Negative for dizziness and weakness.  Hematological:  Negative for adenopathy.  Psychiatric/Behavioral:  The patient is not nervous/anxious.       Objective:    Physical Exam Vitals and nursing note reviewed.  Constitutional:      Appearance: Normal appearance. She is well-developed. She is ill-appearing.  HENT:     Head: Normocephalic.  Right Ear: Hearing, tympanic membrane, ear canal and external ear normal.     Left Ear: Hearing, tympanic membrane, ear canal and external ear normal.     Nose: Congestion and rhinorrhea present.     Right Turbinates: Swollen.     Left Turbinates: Swollen.     Mouth/Throat:     Pharynx: Posterior oropharyngeal erythema present.  Eyes:     Pupils: Pupils are equal, round, and reactive to light.  Cardiovascular:     Rate and Rhythm: Normal rate and regular rhythm.     Pulses: Normal pulses.     Heart sounds: Normal heart sounds.  Pulmonary:     Effort: Pulmonary effort is normal.     Breath sounds: Normal breath sounds.  Abdominal:     Palpations: Abdomen is soft.  Musculoskeletal:        General: Normal range of motion.     Cervical back: Normal range of motion and neck supple.  Lymphadenopathy:     Cervical: No cervical adenopathy.  Skin:    General: Skin is warm and dry.     Capillary Refill: Capillary refill takes less than 2 seconds.  Neurological:     General: No focal deficit present.     Mental Status: She is alert and oriented to person, place, and time.  Psychiatric:        Mood and Affect: Mood normal.        Behavior: Behavior normal.        Thought Content: Thought content normal.         Judgment: Judgment normal.   Today's Vitals   11/02/21 1328  BP: 131/74  Pulse: 77  Temp: 98.1 F (36.7 C)  SpO2: 97%  Weight: 157 lb 6.4 oz (71.4 kg)  Height: $Remove'5\' 5"'KieZQsd$  (1.651 m)   Body mass index is 26.19 kg/m.   Wt Readings from Last 3 Encounters:  11/02/21 157 lb 6.4 oz (71.4 kg)  10/27/21 158 lb 3.2 oz (71.8 kg)  10/19/21 155 lb 8 oz (70.5 kg)    Health Maintenance Due  Topic Date Due   COVID-19 Vaccine (1) Never done   Pneumonia Vaccine 24+ Years old (1 - PCV) Never done    There are no preventive care reminders to display for this patient.   Lab Results  Component Value Date   TSH 5.240 (H) 09/28/2021   Lab Results  Component Value Date   WBC 7.5 09/28/2021   HGB 13.8 09/28/2021   HCT 41.6 09/28/2021   MCV 94 09/28/2021   PLT 265 09/28/2021   Lab Results  Component Value Date   NA 143 09/28/2021   K 4.4 09/28/2021   CO2 25 09/28/2021   GLUCOSE 134 (H) 09/28/2021   BUN 16 09/28/2021   CREATININE 0.99 09/28/2021   BILITOT 0.4 09/28/2021   ALKPHOS 70 09/28/2021   AST 25 09/28/2021   ALT 36 (H) 09/28/2021   PROT 6.6 09/28/2021   ALBUMIN 4.3 09/28/2021   CALCIUM 10.0 09/28/2021   EGFR 59 (L) 09/28/2021   GFR 57.65 (L) 12/23/2015   Lab Results  Component Value Date   CHOL 143 09/28/2021   Lab Results  Component Value Date   HDL 43 09/28/2021   Lab Results  Component Value Date   LDLCALC 66 09/28/2021   Lab Results  Component Value Date   TRIG 203 (H) 09/28/2021   Lab Results  Component Value Date   CHOLHDL 3.3 09/28/2021   Lab Results  Component Value  Date   HGBA1C 6.4 (A) 10/05/2021       Assessment & Plan:  1. Sinusitis, unspecified chronicity, unspecified location Start Augmentin twice daily for next 10 days. Rest and increase fluids. Continue using OTC medication to control symptoms.  And Tessalon Perles 200 mg capsules.  May be taken up to 3 times daily as needed for cough. - amoxicillin-clavulanate (AUGMENTIN) 875-125 MG  tablet; Take 1 tablet by mouth 2 (two) times daily.  Dispense: 20 tablet; Refill: 0 - benzonatate (TESSALON) 200 MG capsule; Take 1 capsule (200 mg total) by mouth 2 (two) times daily as needed for cough.  Dispense: 30 capsule; Refill: 0  2. Subacute cough And Cheratussin AC.  Constipation may be taken up to 3 times daily as needed for cough.  Advise she use this with caution as medication may cause dizziness and drowsiness.  She should not drive or work after taking this medication.  She voiced understanding and agreement with instructions. - guaiFENesin-codeine (CHERATUSSIN AC) 100-10 MG/5ML syrup; Take 5 mLs by mouth 3 (three) times daily as needed for cough.  Dispense: 120 mL; Refill: 0  3. Hypothyroidism, unspecified type Renewed current prescription for levothyroxine 75 mcg daily. - levothyroxine (EUTHYROX) 75 MCG tablet; Take 1 tablet (75 mcg total) by mouth daily.  Dispense: 30 tablet; Refill: 1   Problem List Items Addressed This Visit       Respiratory   Sinusitis - Primary   Relevant Medications   amoxicillin-clavulanate (AUGMENTIN) 875-125 MG tablet   guaiFENesin-codeine (CHERATUSSIN AC) 100-10 MG/5ML syrup   benzonatate (TESSALON) 200 MG capsule     Endocrine   Hypothyroidism   Relevant Medications   levothyroxine (EUTHYROX) 75 MCG tablet     Other   Subacute cough   Relevant Medications   guaiFENesin-codeine (CHERATUSSIN AC) 100-10 MG/5ML syrup     Meds ordered this encounter  Medications   amoxicillin-clavulanate (AUGMENTIN) 875-125 MG tablet    Sig: Take 1 tablet by mouth 2 (two) times daily.    Dispense:  20 tablet    Refill:  0    Order Specific Question:   Supervising Provider    Answer:   Beatrice Lecher D [[id number]]   guaiFENesin-codeine (CHERATUSSIN AC) 100-10 MG/5ML syrup    Sig: Take 5 mLs by mouth 3 (three) times daily as needed for cough.    Dispense:  120 mL    Refill:  0    Order Specific Question:   Supervising Provider    Answer:   Beatrice Lecher D [[id number]]   benzonatate (TESSALON) 200 MG capsule    Sig: Take 1 capsule (200 mg total) by mouth 2 (two) times daily as needed for cough.    Dispense:  30 capsule    Refill:  0    Order Specific Question:   Supervising Provider    Answer:   Hali Marry [[id number]]   levothyroxine (EUTHYROX) 75 MCG tablet    Sig: Take 1 tablet (75 mcg total) by mouth daily.    Dispense:  30 tablet    Refill:  1    Order Specific Question:   Supervising Provider    Answer:   Beatrice Lecher D [[id number]]     Ronnell Freshwater, NP  This note was dictated using Dragon Voice Recognition Software. Rapid proofreading was performed to expedite the delivery of the information. Despite proofreading, phonetic errors will occur which are common with this voice recognition software. Please take this into consideration. If there are any concerns,  please contact our office.

## 2021-11-10 ENCOUNTER — Ambulatory Visit (INDEPENDENT_AMBULATORY_CARE_PROVIDER_SITE_OTHER): Payer: Medicare Other | Admitting: Nurse Practitioner

## 2021-11-10 ENCOUNTER — Encounter: Payer: Self-pay | Admitting: Nurse Practitioner

## 2021-11-10 VITALS — BP 107/66 | HR 84 | Temp 98.6°F | Ht 65.0 in | Wt 161.6 lb

## 2021-11-10 DIAGNOSIS — J0141 Acute recurrent pansinusitis: Secondary | ICD-10-CM | POA: Diagnosis not present

## 2021-11-10 DIAGNOSIS — J309 Allergic rhinitis, unspecified: Secondary | ICD-10-CM | POA: Diagnosis not present

## 2021-11-10 MED ORDER — FLUTICASONE PROPIONATE 50 MCG/ACT NA SUSP: NASAL | 2 refills | Status: DC

## 2021-11-10 MED ORDER — CEFUROXIME AXETIL 250 MG PO TABS
250.0000 mg | ORAL_TABLET | Freq: Two times a day (BID) | ORAL | 0 refills | Status: AC
Start: 2021-11-10 — End: 2021-11-20

## 2021-11-10 NOTE — Progress Notes (Signed)
Acute Office Visit  Subjective:    Patient ID: Jill Daugherty, female    DOB: May 21, 1946, 75 y.o.   MRN: [id number]  Chief Complaint  Patient presents with   Sore Throat    Patient was seen on 10/27/2021 and 11/02/2021 for acute sinusitis. She was initially treated with a z-pack and tessalon perls. When she did not get relief, she was then treated with augmentin and given a prescription for Cheratussin cough suppressant. She states that she initally felt a lot better. In the last three of four days these new symptoms developed. She is still on the augmentin that was started on 11/02/2021.   Sore Throat  This is a recurrent problem. The current episode started in the past 7 days. The problem has been gradually worsening. Neither side of throat is experiencing more pain than the other. There has been no fever. The pain is moderate. Associated symptoms include congestion, coughing, headaches, a hoarse voice and vomiting. Pertinent negatives include no abdominal pain, diarrhea, plugged ear sensation or shortness of breath. She has tried acetaminophen for the symptoms. The treatment provided mild relief.    Past Medical History:  Diagnosis Date   History of chicken pox    History of frequent urinary tract infections    Hyperlipidemia    Hypertension    Overactive bladder    Thyroid disease     History reviewed. No pertinent surgical history.  Family History  Problem Relation Age of Onset   Heart disease Father    Diabetes Paternal Grandmother    Cancer Paternal Grandmother    Breast cancer Neg Hx     Social History   Socioeconomic History   Marital status: Married    Spouse name: Not on file   Number of children: 1   Years of education: 12   Highest education level: Not on file  Occupational History   Occupation: Retired  Tobacco Use   Smoking status: Never   Smokeless tobacco: Never  Scientific laboratory technician Use: Never used  Substance and Sexual Activity   Alcohol  use: No    Alcohol/week: 0.0 standard drinks   Drug use: No   Sexual activity: Not Currently  Other Topics Concern   Not on file  Social History Narrative   Regular exercise-no   Caffeine Use-yes   Social Determinants of Health   Financial Resource Strain: Not on file  Food Insecurity: Not on file  Transportation Needs: Not on file  Physical Activity: Not on file  Stress: Not on file  Social Connections: Not on file  Intimate Partner Violence: Not on file    Outpatient Medications Prior to Visit  Medication Sig Dispense Refill   allopurinol (ZYLOPRIM) 100 MG tablet Take 1 tablet by mouth once daily 90 tablet 0   amLODipine (NORVASC) 5 MG tablet Take 1 tablet (5 mg total) by mouth daily. 90 tablet 1   aspirin EC 81 MG tablet Take 81 mg by mouth daily.     atorvastatin (LIPITOR) 40 MG tablet Take 1 tablet (40 mg total) by mouth at bedtime. 90 tablet 1   benzonatate (TESSALON) 200 MG capsule Take 1 capsule (200 mg total) by mouth 2 (two) times daily as needed for cough. 30 capsule 0   Calcium Carbonate (CALCIUM 600 PO) Take by mouth.     Cholecalciferol (VITAMIN D3) 1000 units CAPS Take 2 capsules by mouth.     dicyclomine (BENTYL) 10 MG capsule Take 1 capsule (10 mg total)  by mouth 2 (two) times daily as needed for spasms. 180 capsule 1   Difluprednate 0.05 % EMUL      famotidine (PEPCID) 20 MG tablet Take 1 tablet (20 mg total) by mouth 2 (two) times daily. 180 tablet 1   gatifloxacin (ZYMAXID) 0.5 % SOLN Place 1 drop into the left eye 4 (four) times daily.     guaiFENesin-codeine (CHERATUSSIN AC) 100-10 MG/5ML syrup Take 5 mLs by mouth 3 (three) times daily as needed for cough. 120 mL 0   hydrochlorothiazide (HYDRODIURIL) 12.5 MG tablet Take 1 tablet (12.5 mg total) by mouth daily. 90 tablet 1   levothyroxine (EUTHYROX) 75 MCG tablet Take 1 tablet (75 mcg total) by mouth daily. 30 tablet 1   losartan (COZAAR) 50 MG tablet Take 2 tablets (100 mg total) by mouth daily. 180 tablet 1    metFORMIN (GLUCOPHAGE-XR) 500 MG 24 hr tablet Take 2 tablets po qd 90 tablet 0   metoCLOPramide (REGLAN) 5 MG tablet Take 1 tablet (5 mg total) by mouth daily as needed for nausea. 30 tablet 1   Miconazole Nitrate 2 % POWD Apply to affected folds of skin twice daily only when red\ irritated 500 g 0   mupirocin ointment (BACTROBAN) 2 % Place 1 application into the nose 2 (two) times daily. 22 g 1   omeprazole (PRILOSEC) 20 MG capsule TAKE 1 CAPSULE BY MOUTH ONCE DAILY AS NEEDED FOR REFLUX/HEARTBURN 90 capsule 1   oxybutynin (DITROPAN-XL) 5 MG 24 hr tablet Take 1 tablet (5 mg total) by mouth at bedtime. 30 tablet 2   pantoprazole (PROTONIX) 20 MG tablet Take 1 tablet (20 mg total) by mouth daily. 30 tablet 1   sertraline (ZOLOFT) 25 MG tablet Take 1 tablet (25 mg total) by mouth daily. 90 tablet 1   amoxicillin-clavulanate (AUGMENTIN) 875-125 MG tablet Take 1 tablet by mouth 2 (two) times daily. 20 tablet 0   fluticasone (FLONASE) 50 MCG/ACT nasal spray USE 1 SPRAY(S) IN EACH NOSTRIL TWICE DAILY FOR 14 DAYS 16 g 0   azelastine (ASTELIN) 0.1 % nasal spray Place 2 sprays into both nostrils 2 (two) times daily. Use in each nostril as directed (Patient not taking: No sig reported) 30 mL 1   triamcinolone ointment (KENALOG) 0.1 % Apply 1 application topically 2 (two) times daily. (Patient not taking: No sig reported) 30 g 1   No facility-administered medications prior to visit.    No Known Allergies  Review of Systems  Constitutional:  Positive for fatigue. Negative for activity change, appetite change, chills and fever.  HENT:  Positive for congestion, hoarse voice, postnasal drip, rhinorrhea, sinus pressure, sinus pain and sore throat. Negative for sneezing.   Eyes: Negative.   Respiratory:  Positive for cough. Negative for chest tightness, shortness of breath and wheezing.   Cardiovascular:  Negative for chest pain and palpitations.  Gastrointestinal:  Positive for vomiting. Negative for  abdominal pain, constipation, diarrhea and nausea.  Endocrine: Negative for cold intolerance, heat intolerance, polydipsia and polyuria.  Genitourinary:  Negative for dyspareunia, dysuria, flank pain, frequency and urgency.  Musculoskeletal:  Negative for arthralgias, back pain and myalgias.  Skin:  Negative for rash.  Allergic/Immunologic: Negative for environmental allergies.  Neurological:  Positive for headaches. Negative for dizziness and weakness.  Hematological:  Negative for adenopathy.  Psychiatric/Behavioral:  The patient is not nervous/anxious.       Objective:    Physical Exam Vitals and nursing note reviewed.  Constitutional:  Appearance: Normal appearance. She is well-developed. She is ill-appearing.  HENT:     Head: Normocephalic and atraumatic.     Right Ear: Hearing, tympanic membrane, ear canal and external ear normal.     Left Ear: Hearing, tympanic membrane, ear canal and external ear normal.     Nose: Congestion and rhinorrhea present.     Right Turbinates: Enlarged and swollen.     Left Turbinates: Enlarged and swollen.     Right Sinus: Maxillary sinus tenderness and frontal sinus tenderness present.     Left Sinus: Maxillary sinus tenderness and frontal sinus tenderness present.     Mouth/Throat:     Pharynx: Posterior oropharyngeal erythema present.  Eyes:     Pupils: Pupils are equal, round, and reactive to light.  Cardiovascular:     Rate and Rhythm: Normal rate and regular rhythm.     Pulses: Normal pulses.     Heart sounds: Normal heart sounds.  Pulmonary:     Effort: Pulmonary effort is normal.     Breath sounds: Normal breath sounds.  Abdominal:     Palpations: Abdomen is soft.  Musculoskeletal:        General: Normal range of motion.     Cervical back: Normal range of motion and neck supple.  Lymphadenopathy:     Cervical: Cervical adenopathy present.  Skin:    General: Skin is warm and dry.     Capillary Refill: Capillary refill takes  less than 2 seconds.  Neurological:     General: No focal deficit present.     Mental Status: She is alert and oriented to person, place, and time.  Psychiatric:        Mood and Affect: Mood normal.        Behavior: Behavior normal.        Thought Content: Thought content normal.        Judgment: Judgment normal.    Today's Vitals   11/10/21 1028  BP: 107/66  Pulse: 84  Temp: 98.6 F (37 C)  SpO2: 97%  Weight: 161 lb 9.6 oz (73.3 kg)  Height: _0  (1.651 m)   Body mass index is 26.89 kg/m.   Wt Readings from Last 3 Encounters:  11/10/21 161 lb 9.6 oz (73.3 kg)  11/02/21 157 lb 6.4 oz (71.4 kg)  10/27/21 158 lb 3.2 oz (71.8 kg)    Health Maintenance Due  Topic Date Due   COVID-19 Vaccine (1) Never done   Pneumonia Vaccine 50+ Years old (1 - PCV) Never done    There are no preventive care reminders to display for this patient.   Lab Results  Component Value Date   TSH 5.240 (H) 09/28/2021   Lab Results  Component Value Date   WBC 7.5 09/28/2021   HGB 13.8 09/28/2021   HCT 41.6 09/28/2021   MCV 94 09/28/2021   PLT 265 09/28/2021   Lab Results  Component Value Date   NA 143 09/28/2021   K 4.4 09/28/2021   CO2 25 09/28/2021   GLUCOSE 134 (H) 09/28/2021   BUN 16 09/28/2021   CREATININE 0.99 09/28/2021   BILITOT 0.4 09/28/2021   ALKPHOS 70 09/28/2021   AST 25 09/28/2021   ALT 36 (H) 09/28/2021   PROT 6.6 09/28/2021   ALBUMIN 4.3 09/28/2021   CALCIUM 10.0 09/28/2021   EGFR 59 (L) 09/28/2021   GFR 57.65 (L) 12/23/2015   Lab Results  Component Value Date   CHOL 143 09/28/2021   Lab Results  Component Value Date   HDL 43 09/28/2021   Lab Results  Component Value Date   LDLCALC 66 09/28/2021   Lab Results  Component Value Date   TRIG 203 (H) 09/28/2021   Lab Results  Component Value Date   CHOLHDL 3.3 09/28/2021   Lab Results  Component Value Date   HGBA1C 6.4 (A) 10/05/2021       Assessment & Plan:  1. Acute recurrent  pansinusitis Discontinue use of Augmentin.  Start Ceftin 250 mg tablets take 1 tablet twice daily for next 10 days. Rest and increase fluids. Continue using OTC medication to control symptoms.   - cefUROXime (CEFTIN) 250 MG tablet; Take 1 tablet (250 mg total) by mouth 2 (two) times daily with a meal for 10 days.  Dispense: 20 tablet; Refill: 0  2. Allergic rhinitis, unspecified seasonality, unspecified trigger Add Flonase nasal spray.  She is continued on spray in blood nostril twice daily for next 10 days. - fluticasone (FLONASE) 50 MCG/ACT nasal spray; USE 1 SPRAY(S) IN EACH NOSTRIL TWICE DAILY FOR 14 DAYS  Dispense: 16 g; Refill: 2   Problem List Items Addressed This Visit       Respiratory   Allergic rhinitis   Relevant Medications   fluticasone (FLONASE) 50 MCG/ACT nasal spray   Sinusitis - Primary   Relevant Medications   fluticasone (FLONASE) 50 MCG/ACT nasal spray   cefUROXime (CEFTIN) 250 MG tablet     Meds ordered this encounter  Medications   fluticasone (FLONASE) 50 MCG/ACT nasal spray    Sig: USE 1 SPRAY(S) IN EACH NOSTRIL TWICE DAILY FOR 14 DAYS    Dispense:  16 g    Refill:  2    Order Specific Question:   Supervising Provider    Answer:   Beatrice Lecher D [[id number]]   cefUROXime (CEFTIN) 250 MG tablet    Sig: Take 1 tablet (250 mg total) by mouth 2 (two) times daily with a meal for 10 days.    Dispense:  20 tablet    Refill:  0    Order Specific Question:   Supervising Provider    Answer:   Beatrice Lecher D [[id number]]     Ronnell Freshwater, NP  This note was dictated using Dragon Voice Recognition Software. Rapid proofreading was performed to expedite the delivery of the information. Despite proofreading, phonetic errors will occur which are common with this voice recognition software. Please take this into consideration. If there are any concerns, please contact our office.

## 2021-12-01 ENCOUNTER — Other Ambulatory Visit: Payer: Self-pay

## 2021-12-01 DIAGNOSIS — E039 Hypothyroidism, unspecified: Secondary | ICD-10-CM

## 2021-12-01 MED ORDER — LEVOTHYROXINE SODIUM 75 MCG PO TABS: 75.0000 ug | ORAL_TABLET | Freq: Every day | ORAL | 0 refills | Status: DC

## 2021-12-07 ENCOUNTER — Telehealth: Payer: Self-pay | Admitting: Nurse Practitioner

## 2021-12-07 NOTE — Telephone Encounter (Signed)
Patients husband called and said the pharmacy gave her the brand name for her thyroid medication however they want the generic. Also pt is out of metformin, can she get a refill? [phone]

## 2021-12-07 NOTE — Telephone Encounter (Signed)
Called in Rx to pharmacy.

## 2021-12-15 ENCOUNTER — Telehealth: Payer: Self-pay | Admitting: Nurse Practitioner

## 2021-12-15 ENCOUNTER — Other Ambulatory Visit: Payer: Self-pay

## 2021-12-15 DIAGNOSIS — L538 Other specified erythematous conditions: Secondary | ICD-10-CM | POA: Diagnosis not present

## 2021-12-15 DIAGNOSIS — L820 Inflamed seborrheic keratosis: Secondary | ICD-10-CM | POA: Diagnosis not present

## 2021-12-15 DIAGNOSIS — L570 Actinic keratosis: Secondary | ICD-10-CM | POA: Diagnosis not present

## 2021-12-15 DIAGNOSIS — L84 Corns and callosities: Secondary | ICD-10-CM | POA: Diagnosis not present

## 2021-12-15 DIAGNOSIS — E1122 Type 2 diabetes mellitus with diabetic chronic kidney disease: Secondary | ICD-10-CM

## 2021-12-15 MED ORDER — METFORMIN HCL ER 500 MG PO TB24: ORAL_TABLET | ORAL | 1 refills | Status: DC

## 2021-12-15 MED ORDER — METFORMIN HCL ER 500 MG PO TB24: ORAL_TABLET | ORAL | 0 refills | Status: DC

## 2021-12-15 NOTE — Telephone Encounter (Signed)
Patient requesting a refill of her metformin however she wants a few called in to [facility] on [address] and a 90 day supply sent to [facility]. [phone]

## 2021-12-15 NOTE — Telephone Encounter (Signed)
Rx was sent to both Pharmacy

## 2022-01-06 ENCOUNTER — Encounter: Payer: Self-pay | Admitting: Nurse Practitioner

## 2022-01-06 ENCOUNTER — Other Ambulatory Visit: Payer: Self-pay

## 2022-01-06 ENCOUNTER — Ambulatory Visit (INDEPENDENT_AMBULATORY_CARE_PROVIDER_SITE_OTHER): Payer: Medicare Other | Admitting: Nurse Practitioner

## 2022-01-06 VITALS — BP 133/72 | HR 67 | Temp 97.5°F | Ht 65.0 in | Wt 156.1 lb

## 2022-01-06 DIAGNOSIS — E1159 Type 2 diabetes mellitus with other circulatory complications: Secondary | ICD-10-CM

## 2022-01-06 DIAGNOSIS — K580 Irritable bowel syndrome with diarrhea: Secondary | ICD-10-CM | POA: Diagnosis not present

## 2022-01-06 DIAGNOSIS — E039 Hypothyroidism, unspecified: Secondary | ICD-10-CM | POA: Diagnosis not present

## 2022-01-06 DIAGNOSIS — I152 Hypertension secondary to endocrine disorders: Secondary | ICD-10-CM

## 2022-01-06 DIAGNOSIS — E663 Overweight: Secondary | ICD-10-CM

## 2022-01-06 DIAGNOSIS — E119 Type 2 diabetes mellitus without complications: Secondary | ICD-10-CM | POA: Diagnosis not present

## 2022-01-06 DIAGNOSIS — R112 Nausea with vomiting, unspecified: Secondary | ICD-10-CM | POA: Diagnosis not present

## 2022-01-06 LAB — POCT GLYCOSYLATED HEMOGLOBIN (HGB A1C): Hemoglobin A1C: 6.8 % — AB (ref 4.0–5.6)

## 2022-01-06 MED ORDER — METOCLOPRAMIDE HCL 5 MG PO TABS: 5.0000 mg | ORAL_TABLET | Freq: Every day | ORAL | 1 refills | Status: DC | PRN

## 2022-01-06 MED ORDER — DICYCLOMINE HCL 10 MG PO CAPS: 10.0000 mg | ORAL_CAPSULE | Freq: Two times a day (BID) | ORAL | 1 refills | Status: DC | PRN

## 2022-01-06 NOTE — Progress Notes (Signed)
Established Patient Office Visit  Subjective:  Patient ID: Jill Daugherty, female    DOB: [birth year]  Age: 76 y.o. MRN: [id number]  CC:  Chief Complaint  Patient presents with   Follow-up    HPI Jill Daugherty presents for follow up visit. She states that she is feeling well. The dose of her levothyroxine was increased to 55mcg. She states that this seems to be doing well. Will check thyroid panel today for surveillance.  Blood sugars are well managed. She currently takes metformin. Her HgbA1c is 6.3.  She has no new concerns or complaints today.   Past Medical History:  Diagnosis Date   History of chicken pox    History of frequent urinary tract infections    Hyperlipidemia    Hypertension    Overactive bladder    Thyroid disease     History reviewed. No pertinent surgical history.  Family History  Problem Relation Age of Onset   Heart disease Father    Diabetes Paternal Grandmother    Cancer Paternal Grandmother    Breast cancer Neg Hx     Social History   Socioeconomic History   Marital status: Married    Spouse name: Not on file   Number of children: 1   Years of education: 12   Highest education level: Not on file  Occupational History   Occupation: Retired  Tobacco Use   Smoking status: Never   Smokeless tobacco: Never  Scientific laboratory technician Use: Never used  Substance and Sexual Activity   Alcohol use: No    Alcohol/week: 0.0 standard drinks   Drug use: No   Sexual activity: Not Currently  Other Topics Concern   Not on file  Social History Narrative   Regular exercise-no   Caffeine Use-yes   Social Determinants of Health   Financial Resource Strain: Not on file  Food Insecurity: Not on file  Transportation Needs: Not on file  Physical Activity: Not on file  Stress: Not on file  Social Connections: Not on file  Intimate Partner Violence: Not on file    Outpatient Medications Prior to Visit  Medication Sig Dispense  Refill   allopurinol (ZYLOPRIM) 100 MG tablet Take 1 tablet by mouth once daily 90 tablet 0   amLODipine (NORVASC) 5 MG tablet Take 1 tablet (5 mg total) by mouth daily. 90 tablet 1   aspirin EC 81 MG tablet Take 81 mg by mouth daily.     atorvastatin (LIPITOR) 40 MG tablet Take 1 tablet (40 mg total) by mouth at bedtime. 90 tablet 1   azelastine (ASTELIN) 0.1 % nasal spray Place 2 sprays into both nostrils 2 (two) times daily. Use in each nostril as directed (Patient not taking: Reported on 10/05/2021) 30 mL 1   benzonatate (TESSALON) 200 MG capsule Take 1 capsule (200 mg total) by mouth 2 (two) times daily as needed for cough. 30 capsule 0   Calcium Carbonate (CALCIUM 600 PO) Take by mouth.     Cholecalciferol (VITAMIN D3) 1000 units CAPS Take 2 capsules by mouth.     Difluprednate 0.05 % EMUL      famotidine (PEPCID) 20 MG tablet Take 1 tablet (20 mg total) by mouth 2 (two) times daily. 180 tablet 1   fluticasone (FLONASE) 50 MCG/ACT nasal spray USE 1 SPRAY(S) IN EACH NOSTRIL TWICE DAILY FOR 14 DAYS 16 g 2   gatifloxacin (ZYMAXID) 0.5 % SOLN Place 1 drop into the left eye 4 (four) times  daily.     guaiFENesin-codeine (CHERATUSSIN AC) 100-10 MG/5ML syrup Take 5 mLs by mouth 3 (three) times daily as needed for cough. 120 mL 0   hydrochlorothiazide (HYDRODIURIL) 12.5 MG tablet Take 1 tablet (12.5 mg total) by mouth daily. 90 tablet 1   levothyroxine (EUTHYROX) 75 MCG tablet Take 1 tablet (75 mcg total) by mouth daily. 90 tablet 0   losartan (COZAAR) 50 MG tablet Take 2 tablets (100 mg total) by mouth daily. 180 tablet 1   metFORMIN (GLUCOPHAGE-XR) 500 MG 24 hr tablet Take 2 tablets po qd 30 tablet 0   Miconazole Nitrate 2 % POWD Apply to affected folds of skin twice daily only when red\ irritated 500 g 0   mupirocin ointment (BACTROBAN) 2 % Place 1 application into the nose 2 (two) times daily. 22 g 1   omeprazole (PRILOSEC) 20 MG capsule TAKE 1 CAPSULE BY MOUTH ONCE DAILY AS NEEDED FOR  REFLUX/HEARTBURN 90 capsule 1   oxybutynin (DITROPAN-XL) 5 MG 24 hr tablet Take 1 tablet (5 mg total) by mouth at bedtime. 30 tablet 2   pantoprazole (PROTONIX) 20 MG tablet Take 1 tablet (20 mg total) by mouth daily. 30 tablet 1   sertraline (ZOLOFT) 25 MG tablet Take 1 tablet (25 mg total) by mouth daily. 90 tablet 1   triamcinolone ointment (KENALOG) 0.1 % Apply 1 application topically 2 (two) times daily. (Patient not taking: Reported on 10/05/2021) 30 g 1   dicyclomine (BENTYL) 10 MG capsule Take 1 capsule (10 mg total) by mouth 2 (two) times daily as needed for spasms. 180 capsule 1   metoCLOPramide (REGLAN) 5 MG tablet Take 1 tablet (5 mg total) by mouth daily as needed for nausea. 30 tablet 1   No facility-administered medications prior to visit.    No Known Allergies  ROS Review of Systems  Constitutional:  Negative for activity change, appetite change, chills, fatigue and fever.  HENT:  Negative for congestion, postnasal drip, rhinorrhea, sinus pressure, sinus pain, sneezing and sore throat.   Eyes: Negative.   Respiratory:  Negative for cough, chest tightness, shortness of breath and wheezing.   Cardiovascular:  Negative for chest pain and palpitations.  Gastrointestinal:  Negative for abdominal pain, constipation, diarrhea, nausea and vomiting.  Endocrine: Negative for cold intolerance, heat intolerance, polydipsia and polyuria.       Blood sugars doing well    Genitourinary:  Negative for dyspareunia, dysuria, flank pain, frequency and urgency.  Musculoskeletal:  Negative for arthralgias, back pain and myalgias.  Skin:  Negative for rash.  Allergic/Immunologic: Positive for environmental allergies.  Neurological:  Negative for dizziness, weakness and headaches.  Hematological:  Negative for adenopathy.  Psychiatric/Behavioral:  The patient is not nervous/anxious.      Objective:    Physical Exam Vitals and nursing note reviewed.  Constitutional:      Appearance:  Normal appearance. She is well-developed.  HENT:     Head: Normocephalic and atraumatic.     Nose: Nose normal.     Mouth/Throat:     Mouth: Mucous membranes are moist.  Eyes:     Extraocular Movements: Extraocular movements intact.     Conjunctiva/sclera: Conjunctivae normal.     Pupils: Pupils are equal, round, and reactive to light.  Cardiovascular:     Rate and Rhythm: Normal rate and regular rhythm.     Pulses: Normal pulses.     Heart sounds: Normal heart sounds.  Pulmonary:     Effort: Pulmonary effort is normal.  Breath sounds: Normal breath sounds.  Abdominal:     Palpations: Abdomen is soft.  Musculoskeletal:        General: Normal range of motion.     Cervical back: Normal range of motion and neck supple.  Lymphadenopathy:     Cervical: No cervical adenopathy.  Skin:    General: Skin is warm and dry.     Capillary Refill: Capillary refill takes less than 2 seconds.  Neurological:     General: No focal deficit present.     Mental Status: She is alert and oriented to person, place, and time.  Psychiatric:        Mood and Affect: Mood normal.        Behavior: Behavior normal.        Thought Content: Thought content normal.        Judgment: Judgment normal.   Today's Vitals   01/06/22 1010  BP: 133/72  Pulse: 67  Temp: (!) 97.5 F (36.4 C)  SpO2: 99%  Weight: 156 lb 1.6 oz (70.8 kg)  Height: $Remove'5\' 5"'TNhjsXS$  (1.651 m)   Body mass index is 25.98 kg/m.   Wt Readings from Last 3 Encounters:  01/06/22 156 lb 1.6 oz (70.8 kg)  11/10/21 161 lb 9.6 oz (73.3 kg)  11/02/21 157 lb 6.4 oz (71.4 kg)     Health Maintenance Due  Topic Date Due   COVID-19 Vaccine (1) Never done   Pneumonia Vaccine 33+ Years old (1 - PCV) Never done   Zoster Vaccines- Shingrix (1 of 2) Never done   OPHTHALMOLOGY EXAM  12/24/2021    There are no preventive care reminders to display for this patient.  Lab Results  Component Value Date   TSH 5.240 (H) 09/28/2021   Lab Results   Component Value Date   WBC 7.5 09/28/2021   HGB 13.8 09/28/2021   HCT 41.6 09/28/2021   MCV 94 09/28/2021   PLT 265 09/28/2021   Lab Results  Component Value Date   NA 143 09/28/2021   K 4.4 09/28/2021   CO2 25 09/28/2021   GLUCOSE 134 (H) 09/28/2021   BUN 16 09/28/2021   CREATININE 0.99 09/28/2021   BILITOT 0.4 09/28/2021   ALKPHOS 70 09/28/2021   AST 25 09/28/2021   ALT 36 (H) 09/28/2021   PROT 6.6 09/28/2021   ALBUMIN 4.3 09/28/2021   CALCIUM 10.0 09/28/2021   EGFR 59 (L) 09/28/2021   GFR 57.65 (L) 12/23/2015   Lab Results  Component Value Date   CHOL 143 09/28/2021   Lab Results  Component Value Date   HDL 43 09/28/2021   Lab Results  Component Value Date   LDLCALC 66 09/28/2021   Lab Results  Component Value Date   TRIG 203 (H) 09/28/2021   Lab Results  Component Value Date   CHOLHDL 3.3 09/28/2021   Lab Results  Component Value Date   HGBA1C 6.8 (A) 01/06/2022      Assessment & Plan:  1. Type 2 diabetes mellitus without complication, without long-term current use of insulin (HCC) HgbA1c 6.3 today. Continue metformin as prescribed. Recheck HgbA1c in 4 months - POCT glycosylated hemoglobin (Hb A1C)  2. Acquired hypothyroidism Check tsh and free t4 today. Adjust levothyroxine dose as indicated  - TSH; Future - T4, free; Future - TSH - T4, free  3. Hypertension associated with diabetes ([place]) Bp well managed. Continue medication a prescribed    4. Irritable bowel syndrome with diarrhea May take dicyclomine as prescribed and as  needed. New prescription sent to her mail pharmacy today - dicyclomine (BENTYL) 10 MG capsule; Take 1 capsule (10 mg total) by mouth 2 (two) times daily as needed for spasms.  Dispense: 180 capsule; Refill: 1  5. Nausea and vomiting, unspecified vomiting type May take reglan as needed and as prescribed. A new prescription was sent to her mail pharmacy today.  - metoCLOPramide (REGLAN) 5 MG tablet; Take 1 tablet (5 mg  total) by mouth daily as needed for nausea.  Dispense: 90 tablet; Refill: 1  6. Overweight with body mass index (BMI) 25.0-29.9 Discussed lowering calorie intake to 1500 calories per day and incorporating exercise into daily routine to help lose weight.   Problem List Items Addressed This Visit       Cardiovascular and Mediastinum   Hypertension associated with diabetes ([place])   Relevant Orders   POCT glycosylated hemoglobin (Hb A1C) (Completed)     Digestive   Irritable bowel syndrome with diarrhea   Relevant Medications   dicyclomine (BENTYL) 10 MG capsule   metoCLOPramide (REGLAN) 5 MG tablet   Nausea and vomiting   Relevant Medications   metoCLOPramide (REGLAN) 5 MG tablet     Endocrine   Hypothyroidism   Relevant Orders   TSH   T4, free   Diabetes mellitus ([place]) - Primary     Other   Overweight with body mass index (BMI) 25.0-29.9    Meds ordered this encounter  Medications   dicyclomine (BENTYL) 10 MG capsule    Sig: Take 1 capsule (10 mg total) by mouth 2 (two) times daily as needed for spasms.    Dispense:  180 capsule    Refill:  1    Order Specific Question:   Supervising Provider    Answer:   Beatrice Lecher D [[id number]]   metoCLOPramide (REGLAN) 5 MG tablet    Sig: Take 1 tablet (5 mg total) by mouth daily as needed for nausea.    Dispense:  90 tablet    Refill:  1    Ok to fill with 90 day prescription    Order Specific Question:   Supervising Provider    Answer:   Beatrice Lecher D [[id number]]    Follow-up: Return in about 4 months (around 05/06/2022) for diabetes with HgbA1c check.    Ronnell Freshwater, NP

## 2022-01-07 LAB — T4, FREE: Free T4: 1.52 ng/dL (ref 0.82–1.77)

## 2022-01-07 LAB — TSH: TSH: 0.63 u[IU]/mL (ref 0.450–4.500)

## 2022-01-07 NOTE — Progress Notes (Signed)
Please let the patient know that her thyroid panel looked great. We will conitnue her on the current dose of levothyroxine with no changes. Thanks so much.   -HB

## 2022-01-18 ENCOUNTER — Other Ambulatory Visit: Payer: Self-pay | Admitting: Nurse Practitioner

## 2022-01-18 DIAGNOSIS — E1159 Type 2 diabetes mellitus with other circulatory complications: Secondary | ICD-10-CM

## 2022-01-18 DIAGNOSIS — I152 Hypertension secondary to endocrine disorders: Secondary | ICD-10-CM

## 2022-01-18 DIAGNOSIS — K219 Gastro-esophageal reflux disease without esophagitis: Secondary | ICD-10-CM

## 2022-01-18 DIAGNOSIS — K582 Mixed irritable bowel syndrome: Secondary | ICD-10-CM

## 2022-01-18 DIAGNOSIS — E1169 Type 2 diabetes mellitus with other specified complication: Secondary | ICD-10-CM

## 2022-01-18 DIAGNOSIS — E039 Hypothyroidism, unspecified: Secondary | ICD-10-CM

## 2022-01-19 ENCOUNTER — Other Ambulatory Visit: Payer: Self-pay

## 2022-01-19 DIAGNOSIS — K219 Gastro-esophageal reflux disease without esophagitis: Secondary | ICD-10-CM

## 2022-01-19 MED ORDER — PANTOPRAZOLE SODIUM 20 MG PO TBEC: 20.0000 mg | DELAYED_RELEASE_TABLET | Freq: Every day | ORAL | 0 refills | Status: DC

## 2022-02-06 ENCOUNTER — Other Ambulatory Visit: Payer: Self-pay | Admitting: Nurse Practitioner

## 2022-02-06 DIAGNOSIS — K219 Gastro-esophageal reflux disease without esophagitis: Secondary | ICD-10-CM

## 2022-02-15 ENCOUNTER — Ambulatory Visit (INDEPENDENT_AMBULATORY_CARE_PROVIDER_SITE_OTHER): Payer: Medicare Other | Admitting: Nurse Practitioner

## 2022-02-15 ENCOUNTER — Encounter: Payer: Self-pay | Admitting: Nurse Practitioner

## 2022-02-15 ENCOUNTER — Other Ambulatory Visit: Payer: Self-pay

## 2022-02-15 VITALS — BP 128/74 | HR 88 | Temp 97.5°F | Ht 65.0 in | Wt 156.4 lb

## 2022-02-15 DIAGNOSIS — J0141 Acute recurrent pansinusitis: Secondary | ICD-10-CM

## 2022-02-15 MED ORDER — CEFUROXIME AXETIL 500 MG PO TABS: 500.0000 mg | ORAL_TABLET | Freq: Two times a day (BID) | ORAL | 0 refills | Status: DC

## 2022-02-15 NOTE — Progress Notes (Signed)
Established patient visit   Patient: Jill Daugherty   DOB: 1946/05/21   76 y.o. Female  MRN: [id number] Visit Date: 02/15/2022  Chief Complaint  Patient presents with   Allergies   Subjective    Sinus Problem This is a recurrent problem. The current episode started 1 to 4 weeks ago. The problem has been gradually worsening since onset. There has been no fever. Associated symptoms include congestion, headaches, sinus pressure and sneezing. Pertinent negatives include no chills, coughing, diaphoresis, ear pain, hoarse voice, neck pain, shortness of breath, sore throat or swollen glands. Past treatments include spray decongestants. The treatment provided no relief.     Medications: Outpatient Medications Prior to Visit  Medication Sig   allopurinol (ZYLOPRIM) 100 MG tablet Take 1 tablet by mouth once daily   amLODipine (NORVASC) 5 MG tablet TAKE 1 TABLET BY MOUTH  DAILY   aspirin EC 81 MG tablet Take 81 mg by mouth daily.   atorvastatin (LIPITOR) 40 MG tablet TAKE 1 TABLET BY MOUTH AT  BEDTIME   benzonatate (TESSALON) 200 MG capsule Take 1 capsule (200 mg total) by mouth 2 (two) times daily as needed for cough.   Calcium Carbonate (CALCIUM 600 PO) Take by mouth.   Cholecalciferol (VITAMIN D3) 1000 units CAPS Take 2 capsules by mouth.   dicyclomine (BENTYL) 10 MG capsule Take 1 capsule (10 mg total) by mouth 2 (two) times daily as needed for spasms.   Difluprednate 0.05 % EMUL    famotidine (PEPCID) 20 MG tablet TAKE 1 TABLET BY MOUTH  TWICE DAILY   fluorouracil (EFUDEX) 5 % cream 1 application to affected area nose  and upper forehead   fluticasone (FLONASE) 50 MCG/ACT nasal spray USE 1 SPRAY(S) IN EACH NOSTRIL TWICE DAILY FOR 14 DAYS   gatifloxacin (ZYMAXID) 0.5 % SOLN Place 1 drop into the left eye 4 (four) times daily.   guaiFENesin-codeine (CHERATUSSIN AC) 100-10 MG/5ML syrup Take 5 mLs by mouth 3 (three) times daily as needed for cough.   hydrochlorothiazide (HYDRODIURIL)  12.5 MG tablet TAKE 1 TABLET BY MOUTH  DAILY   levothyroxine (SYNTHROID) 75 MCG tablet TAKE 1 TABLET BY MOUTH DAILY  EQUIVALENT TO EUTHYROX   losartan (COZAAR) 50 MG tablet Take 2 tablets (100 mg total) by mouth daily.   metFORMIN (GLUCOPHAGE-XR) 500 MG 24 hr tablet Take 2 tablets po qd   metoCLOPramide (REGLAN) 5 MG tablet Take 1 tablet (5 mg total) by mouth daily as needed for nausea.   Miconazole Nitrate 2 % POWD Apply to affected folds of skin twice daily only when red\ irritated   mupirocin ointment (BACTROBAN) 2 % Place 1 application into the nose 2 (two) times daily.   omeprazole (PRILOSEC) 20 MG capsule TAKE 1 CAPSULE BY MOUTH  ONCE DAILY AS NEEDED FOR  REFLUX/HEARTBURN   oxybutynin (DITROPAN-XL) 5 MG 24 hr tablet Take 1 tablet (5 mg total) by mouth at bedtime.   pantoprazole (PROTONIX) 20 MG tablet TAKE 1 TABLET BY MOUTH DAILY   sertraline (ZOLOFT) 25 MG tablet TAKE 1 TABLET BY MOUTH  DAILY   azelastine (ASTELIN) 0.1 % nasal spray Place 2 sprays into both nostrils 2 (two) times daily. Use in each nostril as directed   triamcinolone ointment (KENALOG) 0.1 % Apply 1 application topically 2 (two) times daily.   No facility-administered medications prior to visit.    Review of Systems  Constitutional:  Positive for fatigue. Negative for activity change, appetite change, chills, diaphoresis and fever.  HENT:  Positive for congestion, sinus pressure and sneezing. Negative for ear pain, hoarse voice, postnasal drip, rhinorrhea, sinus pain and sore throat.   Eyes: Negative.   Respiratory:  Negative for cough, chest tightness, shortness of breath and wheezing.   Cardiovascular:  Negative for chest pain and palpitations.  Gastrointestinal:  Negative for abdominal pain, constipation, diarrhea, nausea and vomiting.  Endocrine: Negative for cold intolerance, heat intolerance, polydipsia and polyuria.  Genitourinary:  Negative for dyspareunia, dysuria, flank pain, frequency and urgency.   Musculoskeletal:  Negative for arthralgias, back pain, myalgias and neck pain.  Skin:  Negative for rash.  Allergic/Immunologic: Positive for environmental allergies.  Neurological:  Positive for headaches. Negative for dizziness and weakness.  Hematological:  Negative for adenopathy.  Psychiatric/Behavioral:  The patient is not nervous/anxious.     Objective     Today's Vitals   02/15/22 1312  BP: 128/74  Pulse: 88  Temp: (!) 97.5 F (36.4 C)  SpO2: 99%  Weight: 156 lb 6.4 oz (70.9 kg)  Height: 5\' 5"  (1.651 m)   Body mass index is 26.03 kg/m.   Physical Exam Vitals and nursing note reviewed.  Constitutional:      Appearance: Normal appearance. She is well-developed.  HENT:     Head: Normocephalic and atraumatic.     Right Ear: Hearing, tympanic membrane, ear canal and external ear normal.     Left Ear: Hearing, tympanic membrane, ear canal and external ear normal.     Nose: Congestion present.     Right Turbinates: Swollen.     Left Turbinates: Swollen.     Right Sinus: Maxillary sinus tenderness present.     Left Sinus: Maxillary sinus tenderness present.  Eyes:     Pupils: Pupils are equal, round, and reactive to light.  Cardiovascular:     Rate and Rhythm: Normal rate and regular rhythm.     Pulses: Normal pulses.     Heart sounds: Normal heart sounds.  Pulmonary:     Effort: Pulmonary effort is normal.     Breath sounds: Normal breath sounds.  Abdominal:     Palpations: Abdomen is soft.  Musculoskeletal:        General: Normal range of motion.     Cervical back: Normal range of motion and neck supple.  Lymphadenopathy:     Cervical: No cervical adenopathy.  Skin:    General: Skin is warm and dry.     Capillary Refill: Capillary refill takes less than 2 seconds.  Neurological:     General: No focal deficit present.     Mental Status: She is alert and oriented to person, place, and time.  Psychiatric:        Mood and Affect: Mood normal.         Behavior: Behavior normal.        Thought Content: Thought content normal.        Judgment: Judgment normal.      Assessment & Plan     1. Acute recurrent pansinusitis Start ceftin 250mg  twice daily for 7 days. Rest and increase fluids. Continue using OTC medication to control symptoms.   - cefUROXime (CEFTIN) 500 MG tablet; Take 1 tablet (500 mg total) by mouth 2 (two) times daily with a meal.  Dispense: 14 tablet; Refill: 0   Return for prn worsening or persistent symptoms.        Ronnell Freshwater, NP  [facility] [phone] (phone) [phone] (fax)  [facility]

## 2022-02-25 ENCOUNTER — Other Ambulatory Visit: Payer: Self-pay

## 2022-02-25 ENCOUNTER — Telehealth: Payer: Self-pay | Admitting: Nurse Practitioner

## 2022-02-25 DIAGNOSIS — J309 Allergic rhinitis, unspecified: Secondary | ICD-10-CM

## 2022-02-25 MED ORDER — METHYLPREDNISOLONE 4 MG PO TBPK: ORAL_TABLET | ORAL | 0 refills | Status: DC

## 2022-02-25 NOTE — Telephone Encounter (Signed)
Patient called and stated the cefin didn't help and she just seems to be having a lot of allergies. Is there anything that can be called in or any recommendations? Please advise. [phone] ?

## 2022-02-25 NOTE — Telephone Encounter (Signed)
Called pt she is advised of Rx that was sent to pharmacy ?

## 2022-03-24 ENCOUNTER — Other Ambulatory Visit: Payer: Self-pay | Admitting: Nurse Practitioner

## 2022-03-24 DIAGNOSIS — E039 Hypothyroidism, unspecified: Secondary | ICD-10-CM

## 2022-03-24 DIAGNOSIS — E1159 Type 2 diabetes mellitus with other circulatory complications: Secondary | ICD-10-CM

## 2022-04-01 ENCOUNTER — Ambulatory Visit (INDEPENDENT_AMBULATORY_CARE_PROVIDER_SITE_OTHER): Payer: Medicare Other | Admitting: Nurse Practitioner

## 2022-04-01 ENCOUNTER — Encounter: Payer: Self-pay | Admitting: Nurse Practitioner

## 2022-04-01 VITALS — BP 124/73 | HR 76 | Temp 98.1°F | Ht 65.0 in | Wt 155.2 lb

## 2022-04-01 DIAGNOSIS — E119 Type 2 diabetes mellitus without complications: Secondary | ICD-10-CM

## 2022-04-01 DIAGNOSIS — J309 Allergic rhinitis, unspecified: Secondary | ICD-10-CM | POA: Diagnosis not present

## 2022-04-01 MED ORDER — DESLORATADINE 5 MG PO TABS: 5.0000 mg | ORAL_TABLET | Freq: Every day | ORAL | 2 refills | Status: DC

## 2022-04-01 MED ORDER — MONTELUKAST SODIUM 10 MG PO TABS: 10.0000 mg | ORAL_TABLET | Freq: Every day | ORAL | 2 refills | Status: DC

## 2022-04-01 MED ORDER — METHYLPREDNISOLONE 4 MG PO TBPK: ORAL_TABLET | ORAL | 0 refills | Status: DC

## 2022-04-01 NOTE — Progress Notes (Signed)
Established patient visit ? ? ?Patient: Jill Daugherty   DOB: 01/10/1946   75 y.o. Female  MRN: [id number] ?Visit Date: 04/01/2022 ? ?Chief Complaint  ?Patient presents with  ? Allergies  ? ?Subjective  ?  ?HPI  ?The patient is here for acute visit.  ?-nasal congestion - likely allergic rhinitis.  ?-has been present for over a month.  ?-did do a medrol taper about a month ago. This helped tremendously, but symptoms came right back after a month or two.  ?-currently using flonase.  ?-denies fever, headache, sore throat., nausea, or vomiting.  ? ?Medications: ?Outpatient Medications Prior to Visit  ?Medication Sig  ? allopurinol (ZYLOPRIM) 100 MG tablet TAKE 1 TABLET BY MOUTH DAILY  ? amLODipine (NORVASC) 5 MG tablet TAKE 1 TABLET BY MOUTH  DAILY  ? aspirin EC 81 MG tablet Take 81 mg by mouth daily.  ? atorvastatin (LIPITOR) 40 MG tablet TAKE 1 TABLET BY MOUTH AT  BEDTIME  ? benzonatate (TESSALON) 200 MG capsule Take 1 capsule (200 mg total) by mouth 2 (two) times daily as needed for cough.  ? Calcium Carbonate (CALCIUM 600 PO) Take by mouth.  ? cefUROXime (CEFTIN) 500 MG tablet Take 1 tablet (500 mg total) by mouth 2 (two) times daily with a meal.  ? Cholecalciferol (VITAMIN D3) 1000 units CAPS Take 2 capsules by mouth.  ? dicyclomine (BENTYL) 10 MG capsule Take 1 capsule (10 mg total) by mouth 2 (two) times daily as needed for spasms.  ? Difluprednate 0.05 % EMUL   ? famotidine (PEPCID) 20 MG tablet TAKE 1 TABLET BY MOUTH  TWICE DAILY  ? fluorouracil (EFUDEX) 5 % cream 1 application to affected area nose  and upper forehead  ? fluticasone (FLONASE) 50 MCG/ACT nasal spray USE 1 SPRAY(S) IN EACH NOSTRIL TWICE DAILY FOR 14 DAYS  ? gatifloxacin (ZYMAXID) 0.5 % SOLN Place 1 drop into the left eye 4 (four) times daily.  ? guaiFENesin-codeine (CHERATUSSIN AC) 100-10 MG/5ML syrup Take 5 mLs by mouth 3 (three) times daily as needed for cough.  ? hydrochlorothiazide (HYDRODIURIL) 12.5 MG tablet TAKE 1 TABLET BY MOUTH   DAILY  ? levothyroxine (SYNTHROID) 75 MCG tablet TAKE 1 TABLET BY MOUTH DAILY  EQUIVALENT TO EUTHYROX  ? losartan (COZAAR) 50 MG tablet TAKE 2 TABLETS BY MOUTH  DAILY  ? metFORMIN (GLUCOPHAGE-XR) 500 MG 24 hr tablet Take 2 tablets po qd  ? metoCLOPramide (REGLAN) 5 MG tablet Take 1 tablet (5 mg total) by mouth daily as needed for nausea.  ? Miconazole Nitrate 2 % POWD Apply to affected folds of skin twice daily only when red\ irritated  ? mupirocin ointment (BACTROBAN) 2 % Place 1 application into the nose 2 (two) times daily.  ? omeprazole (PRILOSEC) 20 MG capsule TAKE 1 CAPSULE BY MOUTH  ONCE DAILY AS NEEDED FOR  REFLUX/HEARTBURN  ? oxybutynin (DITROPAN-XL) 5 MG 24 hr tablet Take 1 tablet (5 mg total) by mouth at bedtime.  ? pantoprazole (PROTONIX) 20 MG tablet TAKE 1 TABLET BY MOUTH DAILY  ? sertraline (ZOLOFT) 25 MG tablet TAKE 1 TABLET BY MOUTH  DAILY  ? [DISCONTINUED] methylPREDNISolone (MEDROL) 4 MG TBPK tablet Take by mouth as directed for 6 days  ? ?No facility-administered medications prior to visit.  ? ? ?Review of Systems  ?Constitutional:  Negative for activity change, appetite change, chills, fatigue and fever.  ?HENT:  Positive for congestion, postnasal drip, rhinorrhea and sneezing. Negative for sinus pressure, sinus pain and sore throat.   ?  Eyes: Negative.   ?Respiratory:  Negative for cough, chest tightness, shortness of breath and wheezing.   ?Cardiovascular:  Negative for chest pain and palpitations.  ?Gastrointestinal:  Negative for abdominal pain, constipation, diarrhea, nausea and vomiting.  ?Endocrine: Negative for cold intolerance, heat intolerance, polydipsia and polyuria.  ?     States that blood sugars have been a little elevated recently.   ?Genitourinary:  Negative for dyspareunia, dysuria, flank pain, frequency and urgency.  ?Musculoskeletal:  Negative for arthralgias, back pain and myalgias.  ?Skin:  Negative for rash.  ?Allergic/Immunologic: Positive for environmental allergies.   ?Neurological:  Negative for dizziness, weakness and headaches.  ?Hematological:  Negative for adenopathy.  ?Psychiatric/Behavioral:  The patient is not nervous/anxious.   ? ? Objective  ?  ? ?Today's Vitals  ? 04/01/22 0942  ?BP: 124/73  ?Pulse: 76  ?Temp: 98.1 ?F (36.7 ?C)  ?SpO2: 98%  ?Weight: 155 lb 3.2 oz (70.4 kg)  ?Height: '5\' 5"'$  (1.651 m)  ? ?Body mass index is 25.83 kg/m?.  ? ?Physical Exam ?Vitals and nursing note reviewed.  ?Constitutional:   ?   Appearance: Normal appearance. She is Daugherty-developed.  ?HENT:  ?   Head: Normocephalic and atraumatic.  ?   Right Ear: Tympanic membrane, ear canal and external ear normal.  ?   Left Ear: Tympanic membrane, ear canal and external ear normal.  ?   Nose: Congestion and rhinorrhea present. Rhinorrhea is clear.  ?   Right Sinus: No maxillary sinus tenderness or frontal sinus tenderness.  ?   Left Sinus: No maxillary sinus tenderness or frontal sinus tenderness.  ?   Mouth/Throat:  ?   Mouth: Mucous membranes are moist.  ?   Pharynx: Oropharynx is clear.  ?Eyes:  ?   Extraocular Movements: Extraocular movements intact.  ?   Conjunctiva/sclera: Conjunctivae normal.  ?   Pupils: Pupils are equal, round, and reactive to light.  ?Cardiovascular:  ?   Rate and Rhythm: Normal rate and regular rhythm.  ?   Pulses: Normal pulses.  ?   Heart sounds: Normal heart sounds.  ?Pulmonary:  ?   Effort: Pulmonary effort is normal.  ?   Breath sounds: Normal breath sounds.  ?Abdominal:  ?   Palpations: Abdomen is soft.  ?Musculoskeletal:     ?   General: Normal range of motion.  ?   Cervical back: Normal range of motion and neck supple.  ?Lymphadenopathy:  ?   Cervical: No cervical adenopathy.  ?Skin: ?   General: Skin is warm and dry.  ?   Capillary Refill: Capillary refill takes less than 2 seconds.  ?Neurological:  ?   General: No focal deficit present.  ?   Mental Status: She is alert and oriented to person, place, and time.  ?Psychiatric:     ?   Mood and Affect: Mood normal.      ?   Behavior: Behavior normal.     ?   Thought Content: Thought content normal.     ?   Judgment: Judgment normal.  ?  ? ? Assessment & Plan  ?  ? ?1. Allergic rhinitis, unspecified seasonality, unspecified trigger ?Start clarinex daily in the mornings and singulair '10mg'$  in the evenings. Continue with flonase nasal spray every day. Will do second medrol taper to help acute symptoms. Take as directed for 6 days.  ?- desloratadine (CLARINEX) 5 MG tablet; Take 1 tablet (5 mg total) by mouth daily.  Dispense: 30 tablet; Refill: 2 ?-  methylPREDNISolone (MEDROL) 4 MG TBPK tablet; Take by mouth as directed for 6 days  Dispense: 21 tablet; Refill: 0 ?- montelukast (SINGULAIR) 10 MG tablet; Take 1 tablet (10 mg total) by mouth at bedtime.  Dispense: 30 tablet; Refill: 2 ? ?2. Type 2 diabetes mellitus without complication, without long-term current use of insulin ([place]) ?Monitor blood sugars closely while on medrol taper as they may elevate. Drink plenty of water. Will check HgbA1c at next visit in May 2023.   ? ?Return for as scheduled, check HgbA1c.  ?   ? ? ? ?Ronnell Freshwater, NP  ?[facility] ?[phone] (phone) ?[phone] (fax) ? ?[facility] ?

## 2022-04-24 ENCOUNTER — Other Ambulatory Visit: Payer: Self-pay | Admitting: Nurse Practitioner

## 2022-04-24 DIAGNOSIS — R112 Nausea with vomiting, unspecified: Secondary | ICD-10-CM

## 2022-04-24 DIAGNOSIS — I152 Hypertension secondary to endocrine disorders: Secondary | ICD-10-CM

## 2022-04-24 DIAGNOSIS — N1831 Chronic kidney disease, stage 3a: Secondary | ICD-10-CM

## 2022-04-24 DIAGNOSIS — K582 Mixed irritable bowel syndrome: Secondary | ICD-10-CM

## 2022-04-24 DIAGNOSIS — K219 Gastro-esophageal reflux disease without esophagitis: Secondary | ICD-10-CM

## 2022-05-06 ENCOUNTER — Encounter: Payer: Self-pay | Admitting: Nurse Practitioner

## 2022-05-06 ENCOUNTER — Ambulatory Visit (INDEPENDENT_AMBULATORY_CARE_PROVIDER_SITE_OTHER): Payer: Medicare Other | Admitting: Nurse Practitioner

## 2022-05-06 ENCOUNTER — Other Ambulatory Visit: Payer: Self-pay | Admitting: Nurse Practitioner

## 2022-05-06 ENCOUNTER — Telehealth: Payer: Self-pay | Admitting: Nurse Practitioner

## 2022-05-06 VITALS — BP 148/65 | HR 64 | Temp 97.5°F | Ht 64.96 in | Wt 158.0 lb

## 2022-05-06 DIAGNOSIS — E1159 Type 2 diabetes mellitus with other circulatory complications: Secondary | ICD-10-CM | POA: Diagnosis not present

## 2022-05-06 DIAGNOSIS — I152 Hypertension secondary to endocrine disorders: Secondary | ICD-10-CM

## 2022-05-06 DIAGNOSIS — E1165 Type 2 diabetes mellitus with hyperglycemia: Secondary | ICD-10-CM

## 2022-05-06 DIAGNOSIS — E039 Hypothyroidism, unspecified: Secondary | ICD-10-CM | POA: Diagnosis not present

## 2022-05-06 DIAGNOSIS — B3731 Acute candidiasis of vulva and vagina: Secondary | ICD-10-CM | POA: Insufficient documentation

## 2022-05-06 DIAGNOSIS — K580 Irritable bowel syndrome with diarrhea: Secondary | ICD-10-CM | POA: Diagnosis not present

## 2022-05-06 LAB — POCT GLYCOSYLATED HEMOGLOBIN (HGB A1C): Hemoglobin A1C: 7.5 % — AB (ref 4.0–5.6)

## 2022-05-06 MED ORDER — NYSTATIN 100000 UNIT/GM EX OINT: 1.0000 "application " | TOPICAL_OINTMENT | Freq: Two times a day (BID) | CUTANEOUS | 2 refills | Status: DC

## 2022-05-06 MED ORDER — DAPAGLIFLOZIN PROPANEDIOL 5 MG PO TABS: 5.0000 mg | ORAL_TABLET | Freq: Every day | ORAL | 3 refills | Status: DC

## 2022-05-06 MED ORDER — LEVOTHYROXINE SODIUM 75 MCG PO TABS: 75.0000 ug | ORAL_TABLET | Freq: Every day | ORAL | 1 refills | Status: DC

## 2022-05-06 MED ORDER — DICYCLOMINE HCL 10 MG PO CAPS: 10.0000 mg | ORAL_CAPSULE | Freq: Two times a day (BID) | ORAL | 1 refills | Status: DC | PRN

## 2022-05-06 MED ORDER — EMPAGLIFLOZIN 10 MG PO TABS: 10.0000 mg | ORAL_TABLET | Freq: Every day | ORAL | 3 refills | Status: DC

## 2022-05-06 NOTE — Telephone Encounter (Signed)
Patient called and stated the Jardiance that she was put on today was $600 and they cannot afford that. Can you please change? ?

## 2022-05-06 NOTE — Progress Notes (Signed)
Established patient visit ? ? ?Patient: Jill Daugherty   DOB: [birth year]   76 y.o. Female  MRN: [id number] ?Visit Date: 05/06/2022 ? ? ?Chief Complaint  ?Patient presents with  ? Diabetes  ? ?Subjective  ?  ?Diabetes ?Pertinent negatives for hypoglycemia include no dizziness, headaches or nervousness/anxiousness. Pertinent negatives for diabetes include no chest pain, no fatigue, no polydipsia, no polyuria and no weakness.   ?Patent here for routine follow up  ?-blood pressure elevated  ?-blood sugars elevated - Hgba1c 7.5 today, up from 6.0 at the most recent check.  ?-has occasional episodes of hyperactive bowel sounds and has to rush to the bathroom. Several days ago, she had episode like this, was unable to make it to the bathroom and had incontinent episode. States that she is going out of town tomorrow and does not want to have a similar situation happen.  ?-continues to have allergic rhinitis. She states that this has been a little better since starting nsinjulair.  ? ? ?Medications: ?Outpatient Medications Prior to Visit  ?Medication Sig  ? allopurinol (ZYLOPRIM) 100 MG tablet TAKE 1 TABLET BY MOUTH DAILY  ? amLODipine (NORVASC) 5 MG tablet TAKE 1 TABLET BY MOUTH DAILY  ? aspirin EC 81 MG tablet Take 81 mg by mouth daily.  ? atorvastatin (LIPITOR) 40 MG tablet TAKE 1 TABLET BY MOUTH AT  BEDTIME  ? Calcium Carbonate (CALCIUM 600 PO) Take by mouth.  ? cefUROXime (CEFTIN) 500 MG tablet Take 1 tablet (500 mg total) by mouth 2 (two) times daily with a meal.  ? Cholecalciferol (VITAMIN D3) 1000 units CAPS Take 2 capsules by mouth.  ? desloratadine (CLARINEX) 5 MG tablet Take 1 tablet (5 mg total) by mouth daily.  ? Difluprednate 0.05 % EMUL   ? famotidine (PEPCID) 20 MG tablet TAKE 1 TABLET BY MOUTH TWICE  DAILY  ? fluticasone (FLONASE) 50 MCG/ACT nasal spray USE 1 SPRAY(S) IN EACH NOSTRIL TWICE DAILY FOR 14 DAYS  ? gatifloxacin (ZYMAXID) 0.5 % SOLN Place 1 drop into the left eye 4 (four) times daily.  ?  guaiFENesin-codeine (CHERATUSSIN AC) 100-10 MG/5ML syrup Take 5 mLs by mouth 3 (three) times daily as needed for cough.  ? hydrochlorothiazide (HYDRODIURIL) 12.5 MG tablet TAKE 1 TABLET BY MOUTH DAILY  ? losartan (COZAAR) 50 MG tablet TAKE 2 TABLETS BY MOUTH  DAILY  ? metFORMIN (GLUCOPHAGE-XR) 500 MG 24 hr tablet TAKE 2 TABLETS BY MOUTH DAILY  ? methylPREDNISolone (MEDROL) 4 MG TBPK tablet Take by mouth as directed for 6 days  ? metoCLOPramide (REGLAN) 5 MG tablet TAKE 1 TABLET BY MOUTH DAILY AS  NEEDED FOR NAUSEA  ? Miconazole Nitrate 2 % POWD Apply to affected folds of skin twice daily only when red\ irritated  ? montelukast (SINGULAIR) 10 MG tablet Take 1 tablet (10 mg total) by mouth at bedtime.  ? omeprazole (PRILOSEC) 20 MG capsule TAKE 1 CAPSULE BY MOUTH  ONCE DAILY AS NEEDED FOR  REFLUX/HEARTBURN  ? oxybutynin (DITROPAN-XL) 5 MG 24 hr tablet Take 1 tablet (5 mg total) by mouth at bedtime.  ? pantoprazole (PROTONIX) 20 MG tablet TAKE 1 TABLET BY MOUTH DAILY  ? sertraline (ZOLOFT) 25 MG tablet TAKE 1 TABLET BY MOUTH DAILY  ? [DISCONTINUED] dicyclomine (BENTYL) 10 MG capsule Take 1 capsule (10 mg total) by mouth 2 (two) times daily as needed for spasms.  ? [DISCONTINUED] levothyroxine (SYNTHROID) 75 MCG tablet TAKE 1 TABLET BY MOUTH DAILY  EQUIVALENT TO EUTHYROX  ? [DISCONTINUED] benzonatate (  TESSALON) 200 MG capsule Take 1 capsule (200 mg total) by mouth 2 (two) times daily as needed for cough.  ? [DISCONTINUED] fluorouracil (EFUDEX) 5 % cream 1 application to affected area nose  and upper forehead  ? [DISCONTINUED] mupirocin ointment (BACTROBAN) 2 % Place 1 application into the nose 2 (two) times daily.  ? ?No facility-administered medications prior to visit.  ? ? ?Review of Systems  ?Constitutional:  Negative for activity change, appetite change, chills, fatigue and fever.  ?HENT:  Positive for congestion and postnasal drip. Negative for rhinorrhea, sinus pressure, sinus pain, sneezing and sore throat.    ?Eyes: Negative.   ?Respiratory:  Negative for cough, chest tightness, shortness of breath and wheezing.   ?Cardiovascular:  Negative for chest pain and palpitations.  ?     Blood pressure mildly elevated during today's visit   ?Gastrointestinal:  Negative for abdominal pain, constipation, diarrhea, nausea and vomiting.  ?Endocrine: Negative for cold intolerance, heat intolerance, polydipsia and polyuria.  ?     Elevated blood sugars over past few months. hgbA1c 7.5 today.   ?Genitourinary:  Negative for dyspareunia, dysuria, flank pain, frequency and urgency.  ?Musculoskeletal:  Negative for arthralgias, back pain and myalgias.  ?Skin:  Negative for rash.  ?Allergic/Immunologic: Positive for environmental allergies.  ?Neurological:  Negative for dizziness, weakness and headaches.  ?Hematological:  Negative for adenopathy.  ?Psychiatric/Behavioral:  The patient is not nervous/anxious.   ? ? ? ? Objective  ?  ? ?Today's Vitals  ? 05/06/22 1018  ?BP: (!) 148/65  ?Pulse: 64  ?Temp: (!) 97.5 ?F (36.4 ?C)  ?SpO2: 99%  ?Weight: 158 lb (71.7 kg)  ?Height: 5' 4.96" (1.65 m)  ? ?Body mass index is 26.32 kg/m?.  ? ?BP Readings from Last 3 Encounters:  ?05/06/22 (!) 148/65  ?04/01/22 124/73  ?02/15/22 128/74  ?  ?Wt Readings from Last 3 Encounters:  ?05/06/22 158 lb (71.7 kg)  ?04/01/22 155 lb 3.2 oz (70.4 kg)  ?02/15/22 156 lb 6.4 oz (70.9 kg)  ?  ?Physical Exam ?Vitals and nursing note reviewed.  ?Constitutional:   ?   Appearance: Normal appearance. She is well-developed.  ?HENT:  ?   Head: Normocephalic and atraumatic.  ?   Nose: Congestion present.  ?   Mouth/Throat:  ?   Mouth: Mucous membranes are moist.  ?   Pharynx: Oropharynx is clear.  ?Eyes:  ?   Extraocular Movements: Extraocular movements intact.  ?   Conjunctiva/sclera: Conjunctivae normal.  ?   Pupils: Pupils are equal, round, and reactive to light.  ?Cardiovascular:  ?   Rate and Rhythm: Normal rate and regular rhythm.  ?   Pulses: Normal pulses.  ?   Heart  sounds: Normal heart sounds.  ?Pulmonary:  ?   Effort: Pulmonary effort is normal.  ?   Breath sounds: Normal breath sounds.  ?Abdominal:  ?   Palpations: Abdomen is soft.  ?Musculoskeletal:     ?   General: Normal range of motion.  ?   Cervical back: Normal range of motion and neck supple.  ?Lymphadenopathy:  ?   Cervical: No cervical adenopathy.  ?Skin: ?   General: Skin is warm and dry.  ?   Capillary Refill: Capillary refill takes less than 2 seconds.  ?Neurological:  ?   General: No focal deficit present.  ?   Mental Status: She is alert and oriented to person, place, and time.  ?Psychiatric:     ?   Mood and Affect:  Mood normal.     ?   Behavior: Behavior normal.     ?   Thought Content: Thought content normal.     ?   Judgment: Judgment normal.  ?  ? ? ?Results for orders placed or performed in visit on 05/06/22  ?POCT glycosylated hemoglobin (Hb A1C)  ?Result Value Ref Range  ? Hemoglobin A1C 7.5 (A) 4.0 - 5.6 %  ? HbA1c POC (<> result, manual entry)    ? HbA1c, POC (prediabetic range)    ? HbA1c, POC (controlled diabetic range)    ? ? Assessment & Plan  ?  ?1. Type 2 diabetes mellitus with hyperglycemia, without long-term current use of insulin ([place]) ?elevated HgA1c 7.5 today. Add Jardiance '10mg'$  daily. Continue metformin as previously prescribed. Refer for diabetic eye exam  ?- empagliflozin (JARDIANCE) 10 MG TABS tablet; Take 1 tablet (10 mg total) by mouth daily before breakfast.  Dispense: 30 tablet; Refill: 3 ?- Ambulatory referral to Ophthalmology ?- POCT glycosylated hemoglobin (Hb A1C) ? ?2. Hypertension associated with diabetes ([place]) ?Blood pressure mildly elevated today but generally well controlled. Will have her monitor blood pressures at home. Should be 140/80 or better.  ? ? ?3. Irritable bowel syndrome with diarrhea ?New trial dicyclomine '10mg'$  which may be taken up to twice daily as needed for intestinal cramping and diarrhea.  ?- dicyclomine (BENTYL) 10 MG capsule; Take 1 capsule (10 mg  total) by mouth 2 (two) times daily as needed for spasms.  Dispense: 180 capsule; Refill: 1 ? ?4. Hypothyroidism, unspecified type ?Continue synthroid as prescribed  ?- levothyroxine (SYNTHROID) 75 MCG tablet; Ta

## 2022-05-06 NOTE — Telephone Encounter (Signed)
Called pt she is advised of her Rx that was sent 

## 2022-05-06 NOTE — Progress Notes (Signed)
Jardiance extremely expensive. Change to [place] '5mg'$  daily. Sent to her pharmacy.  ?

## 2022-05-06 NOTE — Telephone Encounter (Signed)
I Changed this to [place] '5mg'$  daily. Sent to her pharmacy. I have no idea if this will be less expensive. If not, I will try something else.  ?

## 2022-05-07 NOTE — Telephone Encounter (Signed)
Patients husband called and stated the [place] is not any cheaper than the other medication. Please advise.  ?

## 2022-05-10 ENCOUNTER — Other Ambulatory Visit: Payer: Self-pay | Admitting: Nurse Practitioner

## 2022-05-10 DIAGNOSIS — E1165 Type 2 diabetes mellitus with hyperglycemia: Secondary | ICD-10-CM

## 2022-05-10 MED ORDER — PIOGLITAZONE HCL 15 MG PO TABS: 15.0000 mg | ORAL_TABLET | Freq: Every day | ORAL | 3 refills | Status: DC

## 2022-05-10 NOTE — Telephone Encounter (Signed)
Called pt LVM to contact office 

## 2022-05-10 NOTE — Progress Notes (Signed)
Due to expense of farxiga and januvia, added actos '25mg'$  daily. This can be taken with breakfast. Sent to [facility] [address]. ?

## 2022-05-10 NOTE — Telephone Encounter (Signed)
Please let the patient know that I added actos '15mg'$  daily. This can be taken with breakfast. Sent to [facility] [address]. Be sure to monitor blood sugars closely. Will recheck satus of diabetes at next visit. Advise that she bring log with her. Thanks so much.   -HB  ?

## 2022-05-11 NOTE — Telephone Encounter (Signed)
Called pt she is advised of her Rx that was sent and recommendation ?

## 2022-06-03 ENCOUNTER — Telehealth: Payer: Self-pay | Admitting: Nurse Practitioner

## 2022-06-03 DIAGNOSIS — J0100 Acute maxillary sinusitis, unspecified: Secondary | ICD-10-CM

## 2022-06-03 MED ORDER — METHYLPREDNISOLONE 4 MG PO TBPK: ORAL_TABLET | ORAL | 0 refills | Status: DC

## 2022-06-03 NOTE — Telephone Encounter (Signed)
Patient states she is having nasal congestion and is requesting medication to be sent to pharmacy.   I spoke with patient and suggested her take an OTC decongestant.   Jill Daugherty agreeable to sending medrol dosepack to help with congestion. AS, CMA

## 2022-06-28 ENCOUNTER — Other Ambulatory Visit: Payer: Self-pay | Admitting: Nurse Practitioner

## 2022-06-28 DIAGNOSIS — E1159 Type 2 diabetes mellitus with other circulatory complications: Secondary | ICD-10-CM

## 2022-07-11 ENCOUNTER — Other Ambulatory Visit: Payer: Self-pay | Admitting: Physician Assistant

## 2022-07-11 DIAGNOSIS — N1831 Chronic kidney disease, stage 3a: Secondary | ICD-10-CM

## 2022-07-27 ENCOUNTER — Other Ambulatory Visit: Payer: Self-pay | Admitting: Physician Assistant

## 2022-07-27 DIAGNOSIS — H524 Presbyopia: Secondary | ICD-10-CM | POA: Diagnosis not present

## 2022-07-27 DIAGNOSIS — H5203 Hypermetropia, bilateral: Secondary | ICD-10-CM | POA: Diagnosis not present

## 2022-07-27 DIAGNOSIS — H1045 Other chronic allergic conjunctivitis: Secondary | ICD-10-CM | POA: Diagnosis not present

## 2022-07-27 DIAGNOSIS — I152 Hypertension secondary to endocrine disorders: Secondary | ICD-10-CM

## 2022-07-27 DIAGNOSIS — H52223 Regular astigmatism, bilateral: Secondary | ICD-10-CM | POA: Diagnosis not present

## 2022-07-27 DIAGNOSIS — E119 Type 2 diabetes mellitus without complications: Secondary | ICD-10-CM | POA: Diagnosis not present

## 2022-07-27 DIAGNOSIS — Z961 Presence of intraocular lens: Secondary | ICD-10-CM | POA: Diagnosis not present

## 2022-07-30 ENCOUNTER — Other Ambulatory Visit: Payer: Self-pay | Admitting: *Deleted

## 2022-07-30 NOTE — Patient Outreach (Signed)
  Care Coordination   Initial Visit Note   07/30/2022 Name: Jill Daugherty MRN: [id number] DOB: [birth year]  Jill Daugherty is a 76 y.o. year old female who sees Junius Creamer, Greer Ee, NP for primary care. I spoke with  Gena Fray [employer] by phone today  RN described services that are [facility] has available Rn, Pharmacy and SW. Patient states she sees [facility] administrator every 3 months that is working with her and declined services.    SDOH assessments and interventions completed:  Yes     Care Coordination Interventions Activated:  No  Care Coordination Interventions:  No, not indicated   Follow up plan: No further intervention required.   Encounter Outcome:  Pt. [place] [facility] [phone]

## 2022-08-04 NOTE — Progress Notes (Signed)
Established patient visit   Patient: Jill Daugherty   DOB: July 02, 1946   76 y.o. Female  MRN: [id number] Visit Date: 08/05/2022   Chief Complaint  Patient presents with   Follow-up   Subjective    HPI  Follow up visit.  -type 2 diabetes - added Jardiance 10 mg daily due to elevated HgbA1c at last visit . -due for check HgbA1c today. Today, HgbA1c is 6.5  -she did go for eye exam. She went last Thursday. Will need to get eye exam  -stable.blood pressure -trial Dicyclomine due to iBS with diarrhea. States that this is doing very well to help control IBS Has no new concerns or  complaints    Medications: Outpatient Medications Prior to Visit  Medication Sig   amLODipine (NORVASC) 5 MG tablet TAKE 1 TABLET BY MOUTH DAILY   aspirin EC 81 MG tablet Take 81 mg by mouth daily.   atorvastatin (LIPITOR) 40 MG tablet TAKE 1 TABLET BY MOUTH AT  BEDTIME   Calcium Carbonate (CALCIUM 600 PO) Take by mouth.   Cholecalciferol (VITAMIN D3) 1000 units CAPS Take 2 capsules by mouth.   Difluprednate 0.05 % EMUL    famotidine (PEPCID) 20 MG tablet TAKE 1 TABLET BY MOUTH TWICE  DAILY   fluticasone (FLONASE) 50 MCG/ACT nasal spray USE 1 SPRAY(S) IN EACH NOSTRIL TWICE DAILY FOR 14 DAYS   gatifloxacin (ZYMAXID) 0.5 % SOLN Place 1 drop into the left eye 4 (four) times daily.   hydrochlorothiazide (HYDRODIURIL) 12.5 MG tablet TAKE 1 TABLET BY MOUTH DAILY   levothyroxine (SYNTHROID) 75 MCG tablet Take 1 tablet (75 mcg total) by mouth daily before breakfast.   losartan (COZAAR) 50 MG tablet TAKE 2 TABLETS BY MOUTH DAILY   metFORMIN (GLUCOPHAGE-XR) 500 MG 24 hr tablet TAKE 2 TABLETS BY MOUTH DAILY   metoCLOPramide (REGLAN) 5 MG tablet TAKE 1 TABLET BY MOUTH DAILY AS  NEEDED FOR NAUSEA   Miconazole Nitrate 2 % POWD Apply to affected folds of skin twice daily only when red\ irritated   montelukast (SINGULAIR) 10 MG tablet Take 1 tablet (10 mg total) by mouth at bedtime.   nystatin ointment  (MYCOSTATIN) Apply 1 application. topically 2 (two) times daily.   omeprazole (PRILOSEC) 20 MG capsule TAKE 1 CAPSULE BY MOUTH  ONCE DAILY AS NEEDED FOR  REFLUX/HEARTBURN   oxybutynin (DITROPAN-XL) 5 MG 24 hr tablet Take 1 tablet (5 mg total) by mouth at bedtime.   pantoprazole (PROTONIX) 20 MG tablet TAKE 1 TABLET BY MOUTH DAILY   sertraline (ZOLOFT) 25 MG tablet TAKE 1 TABLET BY MOUTH DAILY   [DISCONTINUED] allopurinol (ZYLOPRIM) 100 MG tablet TAKE 1 TABLET BY MOUTH DAILY   [DISCONTINUED] desloratadine (CLARINEX) 5 MG tablet Take 1 tablet (5 mg total) by mouth daily.   [DISCONTINUED] dicyclomine (BENTYL) 10 MG capsule Take 1 capsule (10 mg total) by mouth 2 (two) times daily as needed for spasms.   [DISCONTINUED] guaiFENesin-codeine (CHERATUSSIN AC) 100-10 MG/5ML syrup Take 5 mLs by mouth 3 (three) times daily as needed for cough.   [DISCONTINUED] methylPREDNISolone (MEDROL DOSEPAK) 4 MG TBPK tablet Take as directed.   [DISCONTINUED] methylPREDNISolone (MEDROL) 4 MG TBPK tablet Take by mouth as directed for 6 days   [DISCONTINUED] pioglitazone (ACTOS) 15 MG tablet Take 1 tablet (15 mg total) by mouth daily.   No facility-administered medications prior to visit.    Review of Systems  Constitutional:  Negative for activity change, appetite change, chills, fatigue and fever.  HENT:  Negative  for congestion, postnasal drip, rhinorrhea, sinus pressure, sinus pain, sneezing and sore throat.   Eyes: Negative.   Respiratory:  Negative for cough, chest tightness, shortness of breath and wheezing.   Cardiovascular:  Negative for chest pain and palpitations.  Gastrointestinal:  Negative for abdominal pain, constipation, diarrhea, nausea and vomiting.       Improved diarrhea type symptoms   Endocrine: Negative for cold intolerance, heat intolerance, polydipsia and polyuria.       Improved blood sugar  Genitourinary:  Negative for dyspareunia, dysuria, flank pain, frequency and urgency.   Musculoskeletal:  Negative for arthralgias, back pain and myalgias.  Skin:  Negative for rash.  Allergic/Immunologic: Negative for environmental allergies.  Neurological:  Negative for dizziness, weakness and headaches.  Hematological:  Negative for adenopathy.  Psychiatric/Behavioral:  The patient is not nervous/anxious.       Objective     Today's Vitals   08/05/22 1047  BP: 110/67  Pulse: 85  SpO2: 98%  Weight: 159 lb 1.9 oz (72.2 kg)  Height: 5' 4.96" (1.65 m)   Body mass index is 26.51 kg/m.   BP Readings from Last 3 Encounters:  08/05/22 110/67  05/06/22 (!) 148/65  04/01/22 124/73    Wt Readings from Last 3 Encounters:  08/05/22 159 lb 1.9 oz (72.2 kg)  05/06/22 158 lb (71.7 kg)  04/01/22 155 lb 3.2 oz (70.4 kg)    Physical Exam Vitals and nursing note reviewed.  Constitutional:      Appearance: Normal appearance. She is well-developed.  HENT:     Head: Normocephalic and atraumatic.     Nose: Nose normal.     Mouth/Throat:     Mouth: Mucous membranes are moist.     Pharynx: Oropharynx is clear.  Eyes:     Extraocular Movements: Extraocular movements intact.     Conjunctiva/sclera: Conjunctivae normal.     Pupils: Pupils are equal, round, and reactive to light.  Neck:     Vascular: No carotid bruit.  Cardiovascular:     Rate and Rhythm: Normal rate and regular rhythm.     Pulses: Normal pulses.     Heart sounds: Normal heart sounds.  Pulmonary:     Effort: Pulmonary effort is normal.     Breath sounds: Normal breath sounds.  Abdominal:     Palpations: Abdomen is soft.  Musculoskeletal:        General: Normal range of motion.     Cervical back: Normal range of motion and neck supple.  Lymphadenopathy:     Cervical: No cervical adenopathy.  Skin:    General: Skin is warm and dry.     Capillary Refill: Capillary refill takes less than 2 seconds.  Neurological:     General: No focal deficit present.     Mental Status: She is alert and oriented  to person, place, and time.  Psychiatric:        Mood and Affect: Mood normal.        Behavior: Behavior normal.        Thought Content: Thought content normal.        Judgment: Judgment normal.      Results for orders placed or performed in visit on 08/05/22  POCT glycosylated hemoglobin (Hb A1C)  Result Value Ref Range   Hemoglobin A1C 6.5 (A) 4.0 - 5.6 %   HbA1c POC (<> result, manual entry)     HbA1c, POC (prediabetic range)     HbA1c, POC (controlled diabetic range)  Assessment & Plan    1. Type 2 diabetes mellitus with hyperglycemia, without long-term current use of insulin (HCC) Improved glucose control with HgbA1c 6.5 today. Continue all diabetic mediations as before. Recheck HgbA1c at next visit.  - POCT glycosylated hemoglobin (Hb A1C)  2. Hypertension associated with diabetes ([place]) Stable. Continue bp medication as prescribed   3. Irritable bowel syndrome with diarrhea Improved. May take dicyclomine 10 mg up to twice daily as needed for ibs with diarrhea  - dicyclomine (BENTYL) 10 MG capsule; Take 1 capsule (10 mg total) by mouth 2 (two) times daily as needed for spasms.  Dispense: 180 capsule; Refill: 1  4. Allergic rhinitis, unspecified seasonality, unspecified trigger Restart clarinex 5 mg daily. Encouraged her to use flonase nasal spray daily.  - desloratadine (CLARINEX) 5 MG tablet; Take 1 tablet (5 mg total) by mouth daily.  Dispense: 30 tablet; Refill: 2   Problem List Items Addressed This Visit       Cardiovascular and Mediastinum   Hypertension associated with diabetes ([place])     Respiratory   Acute maxillary sinusitis   Relevant Medications   desloratadine (CLARINEX) 5 MG tablet   Allergic rhinitis   Relevant Medications   desloratadine (CLARINEX) 5 MG tablet     Digestive   Irritable bowel syndrome with diarrhea   Relevant Medications   dicyclomine (BENTYL) 10 MG capsule     Endocrine   Diabetes mellitus ([place]) - Primary   Relevant Orders    POCT glycosylated hemoglobin (Hb A1C) (Completed)     Return in about 3 months (around 11/05/2022) for medicare wellness, FBW at time of visit.         Ronnell Freshwater, NP  [facility] [phone] (phone) [phone] (fax)  [place]

## 2022-08-05 ENCOUNTER — Encounter: Payer: Self-pay | Admitting: Nurse Practitioner

## 2022-08-05 ENCOUNTER — Telehealth: Payer: Self-pay | Admitting: Nurse Practitioner

## 2022-08-05 ENCOUNTER — Ambulatory Visit (INDEPENDENT_AMBULATORY_CARE_PROVIDER_SITE_OTHER): Payer: Medicare Other | Admitting: Nurse Practitioner

## 2022-08-05 ENCOUNTER — Other Ambulatory Visit: Payer: Self-pay

## 2022-08-05 VITALS — BP 110/67 | HR 85 | Ht 64.96 in | Wt 159.1 lb

## 2022-08-05 DIAGNOSIS — J309 Allergic rhinitis, unspecified: Secondary | ICD-10-CM

## 2022-08-05 DIAGNOSIS — K580 Irritable bowel syndrome with diarrhea: Secondary | ICD-10-CM

## 2022-08-05 DIAGNOSIS — E1165 Type 2 diabetes mellitus with hyperglycemia: Secondary | ICD-10-CM

## 2022-08-05 DIAGNOSIS — J0100 Acute maxillary sinusitis, unspecified: Secondary | ICD-10-CM | POA: Diagnosis not present

## 2022-08-05 DIAGNOSIS — I152 Hypertension secondary to endocrine disorders: Secondary | ICD-10-CM | POA: Diagnosis not present

## 2022-08-05 DIAGNOSIS — E1159 Type 2 diabetes mellitus with other circulatory complications: Secondary | ICD-10-CM

## 2022-08-05 LAB — POCT GLYCOSYLATED HEMOGLOBIN (HGB A1C): Hemoglobin A1C: 6.5 % — AB (ref 4.0–5.6)

## 2022-08-05 MED ORDER — PIOGLITAZONE HCL 15 MG PO TABS: 15.0000 mg | ORAL_TABLET | Freq: Every day | ORAL | 1 refills | Status: DC

## 2022-08-05 MED ORDER — DESLORATADINE 5 MG PO TABS: 5.0000 mg | ORAL_TABLET | Freq: Every day | ORAL | 2 refills | Status: DC

## 2022-08-05 MED ORDER — DICYCLOMINE HCL 10 MG PO CAPS: 10.0000 mg | ORAL_CAPSULE | Freq: Two times a day (BID) | ORAL | 1 refills | Status: DC | PRN

## 2022-08-05 NOTE — Telephone Encounter (Signed)
Can you please send the Actos over to [facility] on [address] because she only has 2 tablets left and the mail order pharmacy will not get here in time.

## 2022-08-05 NOTE — Telephone Encounter (Signed)
Rx was sent to [facility] 

## 2022-08-06 ENCOUNTER — Other Ambulatory Visit: Payer: Self-pay | Admitting: Physician Assistant

## 2022-09-06 ENCOUNTER — Telehealth: Payer: Self-pay

## 2022-09-06 ENCOUNTER — Other Ambulatory Visit: Payer: Self-pay

## 2022-09-06 DIAGNOSIS — E039 Hypothyroidism, unspecified: Secondary | ICD-10-CM

## 2022-09-06 MED ORDER — LEVOTHYROXINE SODIUM 75 MCG PO TABS: 75.0000 ug | ORAL_TABLET | Freq: Every day | ORAL | 1 refills | Status: DC

## 2022-09-06 NOTE — Telephone Encounter (Signed)
Patients spouse called stating that she needs refill on her Synthroid, please call patient script into [facility], thanks.

## 2022-09-06 NOTE — Telephone Encounter (Signed)
Rx sent to pharmacy   

## 2022-10-13 ENCOUNTER — Other Ambulatory Visit: Payer: Self-pay | Admitting: Nurse Practitioner

## 2022-10-13 DIAGNOSIS — E1165 Type 2 diabetes mellitus with hyperglycemia: Secondary | ICD-10-CM

## 2022-10-23 ENCOUNTER — Other Ambulatory Visit: Payer: Self-pay | Admitting: Physician Assistant

## 2022-11-10 ENCOUNTER — Other Ambulatory Visit: Payer: Self-pay | Admitting: Nurse Practitioner

## 2022-11-10 DIAGNOSIS — K219 Gastro-esophageal reflux disease without esophagitis: Secondary | ICD-10-CM

## 2022-11-19 ENCOUNTER — Other Ambulatory Visit: Payer: Self-pay | Admitting: Nurse Practitioner

## 2022-11-19 DIAGNOSIS — K580 Irritable bowel syndrome with diarrhea: Secondary | ICD-10-CM

## 2022-11-19 DIAGNOSIS — E039 Hypothyroidism, unspecified: Secondary | ICD-10-CM

## 2022-11-19 DIAGNOSIS — E1169 Type 2 diabetes mellitus with other specified complication: Secondary | ICD-10-CM

## 2022-11-22 ENCOUNTER — Telehealth: Payer: Self-pay | Admitting: *Deleted

## 2022-11-22 NOTE — Telephone Encounter (Signed)
Pt husband called and wanted to see if pt could be seen today, stated they think she has UTI. Informed him that there are no openings today however pt has appointment tomorrow for Medicare Wellness. Informed him that we could collect that in the morning at her appointment.  She is also having labs at that appointment. Routing to PCP as an Pharmacist, hospital.  Rochell Puett Zimmerman Rumple, CMA

## 2022-11-22 NOTE — Telephone Encounter (Signed)
Ithink we can get all of this done during her appointment tomorrow

## 2022-11-23 ENCOUNTER — Encounter: Payer: Self-pay | Admitting: Nurse Practitioner

## 2022-11-23 ENCOUNTER — Ambulatory Visit (INDEPENDENT_AMBULATORY_CARE_PROVIDER_SITE_OTHER): Payer: Medicare Other | Admitting: Nurse Practitioner

## 2022-11-23 ENCOUNTER — Telehealth: Payer: Self-pay | Admitting: *Deleted

## 2022-11-23 VITALS — BP 134/80 | HR 78 | Ht 64.96 in | Wt 164.4 lb

## 2022-11-23 DIAGNOSIS — N390 Urinary tract infection, site not specified: Secondary | ICD-10-CM

## 2022-11-23 DIAGNOSIS — E1165 Type 2 diabetes mellitus with hyperglycemia: Secondary | ICD-10-CM

## 2022-11-23 DIAGNOSIS — Z0000 Encounter for general adult medical examination without abnormal findings: Secondary | ICD-10-CM

## 2022-11-23 DIAGNOSIS — R319 Hematuria, unspecified: Secondary | ICD-10-CM | POA: Diagnosis not present

## 2022-11-23 DIAGNOSIS — E1159 Type 2 diabetes mellitus with other circulatory complications: Secondary | ICD-10-CM | POA: Diagnosis not present

## 2022-11-23 DIAGNOSIS — R300 Dysuria: Secondary | ICD-10-CM | POA: Diagnosis not present

## 2022-11-23 DIAGNOSIS — E039 Hypothyroidism, unspecified: Secondary | ICD-10-CM | POA: Diagnosis not present

## 2022-11-23 DIAGNOSIS — I152 Hypertension secondary to endocrine disorders: Secondary | ICD-10-CM | POA: Diagnosis not present

## 2022-11-23 LAB — POCT URINALYSIS DIP (CLINITEK)
Glucose, UA: NEGATIVE mg/dL
Nitrite, UA: NEGATIVE
POC PROTEIN,UA: 100 — AB
Spec Grav, UA: 1.025 (ref 1.010–1.025)
Urobilinogen, UA: 1 E.U./dL
pH, UA: 5.5 (ref 5.0–8.0)

## 2022-11-23 MED ORDER — CEPHALEXIN 250 MG PO CAPS: 500.0000 mg | ORAL_CAPSULE | Freq: Three times a day (TID) | ORAL | 0 refills | Status: DC

## 2022-11-23 MED ORDER — PHENAZOPYRIDINE HCL 200 MG PO TABS: 200.0000 mg | ORAL_TABLET | Freq: Three times a day (TID) | ORAL | 0 refills | Status: DC | PRN

## 2022-11-23 NOTE — Progress Notes (Signed)
Started patient on cephalexin 250 mg TID for 7 days

## 2022-11-23 NOTE — Telephone Encounter (Signed)
Patients husband called to let provider know that patient was told to call if the medication that was sent in for patient made her sick.  He said that the first 2 pills made her sick and she threw up. Please send in another medication please.Marialy Urbanczyk Zimmerman Rumple, CMA

## 2022-11-23 NOTE — Progress Notes (Signed)
Subjective:   Jill Daugherty is a 76 y.o. female who presents for Medicare Annual (Subsequent) preventive examination. -pressure in pelvic part of abdomen.  -feels like she has to urinate frequently and then urinates very little.  -states this has been going on for a week -gradually getting worse   Review of Systems    Review of Systems  Constitutional:  Negative for chills, fever and malaise/fatigue.  HENT:  Positive for congestion. Negative for sinus pain and sore throat.        This is mostly at night   Eyes: Negative.   Respiratory:  Negative for cough, shortness of breath and wheezing.   Cardiovascular:  Negative for chest pain, palpitations and leg swelling.  Gastrointestinal:  Negative for constipation, diarrhea, nausea and vomiting.  Genitourinary:  Positive for dysuria, frequency and urgency.  Musculoskeletal:  Negative for myalgias.  Skin: Negative.   Neurological:  Negative for dizziness and headaches.  Endo/Heme/Allergies:  Does not bruise/bleed easily.  Psychiatric/Behavioral:  Negative for depression. The patient is not nervous/anxious.           Objective:    Today's Vitals   11/23/22 0824 11/23/22 0912  BP: (Abnormal) 142/78 134/80  Pulse: 78   SpO2: 99%   Weight: 164 lb 6.4 oz (74.6 kg)   Height: 5' 4.96" (1.65 m)    Body mass index is 27.39 kg/m.    Current Medications (verified) Outpatient Encounter Medications as of 11/23/2022  Medication Sig   allopurinol (ZYLOPRIM) 100 MG tablet TAKE 1 TABLET BY MOUTH DAILY   amLODipine (NORVASC) 5 MG tablet TAKE 1 TABLET BY MOUTH DAILY   aspirin EC 81 MG tablet Take 81 mg by mouth daily.   atorvastatin (LIPITOR) 40 MG tablet TAKE 1 TABLET BY MOUTH AT  BEDTIME   Calcium Carbonate (CALCIUM 600 PO) Take by mouth.   cephALEXin (KEFLEX) 250 MG capsule Take 2 capsules (500 mg total) by mouth 3 (three) times daily.   Cholecalciferol (VITAMIN D3) 1000 units CAPS Take 2 capsules by mouth.   desloratadine  (CLARINEX) 5 MG tablet Take 1 tablet (5 mg total) by mouth daily.   dicyclomine (BENTYL) 10 MG capsule Take 1 capsule (10 mg total) by mouth 2 (two) times daily as needed for spasms.   Difluprednate 0.05 % EMUL    famotidine (PEPCID) 20 MG tablet TAKE 1 TABLET BY MOUTH TWICE  DAILY   fluticasone (FLONASE) 50 MCG/ACT nasal spray USE 1 SPRAY(S) IN EACH NOSTRIL TWICE DAILY FOR 14 DAYS   gatifloxacin (ZYMAXID) 0.5 % SOLN Place 1 drop into the left eye 4 (four) times daily.   hydrochlorothiazide (HYDRODIURIL) 12.5 MG tablet TAKE 1 TABLET BY MOUTH DAILY   levothyroxine (SYNTHROID) 75 MCG tablet Take 1 tablet (75 mcg total) by mouth daily before breakfast.   losartan (COZAAR) 50 MG tablet TAKE 2 TABLETS BY MOUTH DAILY   metFORMIN (GLUCOPHAGE-XR) 500 MG 24 hr tablet TAKE 2 TABLETS BY MOUTH DAILY   metoCLOPramide (REGLAN) 5 MG tablet TAKE 1 TABLET BY MOUTH DAILY AS  NEEDED FOR NAUSEA   Miconazole Nitrate 2 % POWD Apply to affected folds of skin twice daily only when red\ irritated   montelukast (SINGULAIR) 10 MG tablet Take 1 tablet (10 mg total) by mouth at bedtime.   nystatin ointment (MYCOSTATIN) Apply 1 application. topically 2 (two) times daily.   omeprazole (PRILOSEC) 20 MG capsule TAKE 1 CAPSULE BY MOUTH  ONCE DAILY AS NEEDED FOR  REFLUX/HEARTBURN   oxybutynin (DITROPAN-XL) 5  MG 24 hr tablet Take 1 tablet (5 mg total) by mouth at bedtime.   pantoprazole (PROTONIX) 20 MG tablet TAKE 1 TABLET BY MOUTH DAILY   phenazopyridine (PYRIDIUM) 200 MG tablet Take 1 tablet (200 mg total) by mouth 3 (three) times daily as needed for pain.   pioglitazone (ACTOS) 15 MG tablet TAKE 1 TABLET BY MOUTH DAILY   sertraline (ZOLOFT) 25 MG tablet TAKE 1 TABLET BY MOUTH DAILY   No facility-administered encounter medications on file as of 11/23/2022.    Allergies (verified) Patient has no known allergies.   History: Past Medical History:  Diagnosis Date   History of chicken pox    History of frequent urinary  tract infections    Hyperlipidemia    Hypertension    Overactive bladder    Thyroid disease    History reviewed. No pertinent surgical history. Family History  Problem Relation Age of Onset   Heart disease Father    Diabetes Paternal Grandmother    Cancer Paternal Grandmother    Breast cancer Neg Hx    Social History   Socioeconomic History   Marital status: Married    Spouse name: Not on file   Number of children: 1   Years of education: 12   Highest education level: Not on file  Occupational History   Occupation: Retired  Tobacco Use   Smoking status: Never   Smokeless tobacco: Never  Vaping Use   Vaping Use: Never used  Substance and Sexual Activity   Alcohol use: No    Alcohol/week: 0.0 standard drinks of alcohol   Drug use: No   Sexual activity: Not Currently  Other Topics Concern   Not on file  Social History Narrative   Regular exercise-no   Caffeine Use-yes   Social Determinants of Health   Financial Resource Strain: Low Risk  (11/23/2022)   Overall Financial Resource Strain (CARDIA)    Difficulty of Paying Living Expenses: Not hard at all  Food Insecurity: No Food Insecurity (11/23/2022)   Hunger Vital Sign    Worried About Running Out of Food in the Last Year: Never true    [place] in the Last Year: Never true  Transportation Needs: No Transportation Needs (11/23/2022)   PRAPARE - Hydrologist (Medical): No    Lack of Transportation (Non-Medical): No  Physical Activity: Insufficiently Active (11/23/2022)   Exercise Vital Sign    Days of Exercise per Week: 1 day    Minutes of Exercise per Session: 10 min  Stress: No Stress Concern Present (11/23/2022)   [place]    Feeling of Stress : Not at all  Social Connections: Socially Isolated (11/23/2022)   Social Connection and Isolation Panel [NHANES]    Frequency of Communication with Friends and  Family: Once a week    Frequency of Social Gatherings with Friends and Family: Once a week    Attends Religious Services: Never    Marine scientist or Organizations: No    Attends Archivist Meetings: Never    Marital Status: Married   Physical Exam Vitals and nursing note reviewed.  Constitutional:      Appearance: Normal appearance. She is well-developed.  HENT:     Head: Normocephalic and atraumatic.     Right Ear: Tympanic membrane, ear canal and external ear normal.     Left Ear: Tympanic membrane, ear canal and external ear normal.  Nose: Nose normal.     Mouth/Throat:     Mouth: Mucous membranes are moist.     Pharynx: Oropharynx is clear.  Eyes:     Extraocular Movements: Extraocular movements intact.     Conjunctiva/sclera: Conjunctivae normal.     Pupils: Pupils are equal, round, and reactive to light.  Neck:     Vascular: No carotid bruit.  Cardiovascular:     Rate and Rhythm: Normal rate and regular rhythm.     Pulses: Normal pulses.     Heart sounds: Normal heart sounds.  Pulmonary:     Effort: Pulmonary effort is normal.     Breath sounds: Normal breath sounds.  Abdominal:     General: Abdomen is flat. Bowel sounds are normal. There is no distension.     Palpations: Abdomen is soft. There is no mass.     Tenderness: There is no abdominal tenderness. There is no right CVA tenderness, left CVA tenderness, guarding or rebound.     Hernia: No hernia is present.  Genitourinary:    Comments: Urine sample positive for large WBC, small blood, and moderate protein Musculoskeletal:        General: Normal range of motion.     Cervical back: Normal range of motion and neck supple.  Lymphadenopathy:     Cervical: No cervical adenopathy.  Skin:    General: Skin is warm and dry.     Capillary Refill: Capillary refill takes less than 2 seconds.  Neurological:     General: No focal deficit present.     Mental Status: She is alert and oriented to  person, place, and time. Mental status is at baseline.  Psychiatric:        Mood and Affect: Mood normal.        Behavior: Behavior normal.        Thought Content: Thought content normal.        Judgment: Judgment normal.     Tobacco Counseling Counseling given: Not Answered   Clinical Intake:  Pre-visit preparation completed: Yes  Pain : No/denies pain     BMI - recorded: 27.39 Nutritional Status: BMI 25 -29 Overweight Diabetes: Yes CBG done?: No Did pt. bring in CBG monitor from home?: No  How often do you need to have someone help you when you read instructions, pamphlets, or other written materials from your doctor or pharmacy?: 1 - Never  Diabetic?no  Interpreter Needed?: No      Activities of Daily Living   Row Labels 11/23/2022    8:27 AM 08/05/2022   10:50 AM  In your present state of health, do you have any difficulty performing the following activities:   Section Header. No data exists in this row.    Hearing?   0 0  Vision?   0 0  Difficulty concentrating or making decisions?   0 0  Walking or climbing stairs?   0 0  Dressing or bathing?   0 0  Doing errands, shopping?   0 0    Patient Care Team: Ronnell Freshwater, NP as PCP - General (Family Medicine) Druscilla Brownie, MD as Consulting Physician (Dermatology) Webb Laws, [place] as Referring Physician (Optometry) Celedonio Savage, PA-C as Consulting Physician (Dermatology)  Indicate any recent Medical Services you may have received from other than [facility] providers in the past year (date may be approximate).     Assessment:  1. Encounter for Medicare annual wellness exam Annual medicare wellness visit today   2. Type  2 diabetes mellitus with hyperglycemia, without long-term current use of insulin ([place]) Continue diabetic medication as prescribed. Check HgbA1c and urine microalbumin at next visit   3. Urinary tract infection with hematuria, site unspecified Start cephalexin 250 mg  1 capsule  three times daily for 7 days. Send urine for culture and sensitivity and adjust treatment as indicated.  - cephALEXin (KEFLEX) 250 MG capsule; Take 2 capsules (500 mg total) by mouth 3 (three) times daily.  Dispense: 21 capsule; Refill: 0 - POCT URINALYSIS DIP (CLINITEK)  4. Dysuria May take pyridium three times daily for next few days to treat bladder pain and spasms.  - phenazopyridine (PYRIDIUM) 200 MG tablet; Take 1 tablet (200 mg total) by mouth 3 (three) times daily as needed for pain.  Dispense: 10 tablet; Refill: 0 - POCT URINALYSIS DIP (CLINITEK)  5. Hypertension associated with diabetes ([place]) Stable. Continue bp medication as prescribed   6. Hypothyroidism, unspecified type Continue levothyroxine as prescribed    Hearing/Vision screen No results found.   Depression Screen   Row Labels 11/23/2022    8:27 AM 08/05/2022   10:50 AM 05/06/2022   10:23 AM 02/15/2022    1:16 PM 01/06/2022   10:25 AM 10/05/2021    9:54 AM 07/23/2021    9:15 AM  PHQ 2/9 Scores   Section Header. No data exists in this row.         PHQ - 2 Score   0 0 0 0 2 0 0  PHQ- 9 Score   0 0 0 0 2 0 0    Fall Risk   Row Labels 02/15/2022    1:16 PM 01/06/2022   10:25 AM 10/05/2021    9:54 AM 07/23/2021    9:15 AM 06/03/2021   11:32 AM  Fall Risk    Section Header. No data exists in this row.       Falls in the past year?   0 0 0 0 0  Number falls in past yr:   0 0 0 0 0  Injury with Fall?   0 0 0 0 0  Follow up   _0     FALL RISK PREVENTION PERTAINING TO THE HOME:  Any stairs in or around the home? No  If so, are there any without handrails? No  Home free of loose throw rugs in walkways, pet beds, electrical cords, etc? Yes  Adequate lighting in your home to reduce risk of falls? Yes   ASSISTIVE DEVICES UTILIZED TO PREVENT FALLS:  Life alert? No  Use of a cane, walker  or w/c? No  Grab bars in the bathroom? No  Shower chair or bench in shower? No  Elevated toilet seat or a handicapped toilet? No   TIMED UP AND GO:  Was the test performed? Yes .  Length of time to ambulate 10 feet: 10 sec.   Gait steady and fast without use of assistive device  Cognitive Function:       Row Labels 11/23/2022    8:32 AM 10/05/2021    9:57 AM 10/09/2020    1:12 PM 09/20/2019   10:29 AM  6CIT Screen   Section Header. No data exists in this row.      What Year?   0 points 0 points 4 points 0 points  What month?   0 points 0 points 0 points 0 points  What time?   0  points 0 points 0 points 0 points  Count back from 20   0 points 2 points 2 points 2 points  Months in reverse   2 points 2 points 2 points 4 points  Repeat phrase   2 points 2 points 0 points 0 points  Total Score   4 points 6 points 8 points 6 points    Immunizations Immunization History  Administered Date(s) Administered   Fluad Quad(high Dose 65+) 10/09/2019, 10/05/2021   Influenza, High Dose Seasonal PF 10/05/2018, 10/09/2020   Influenza,inj,Quad PF,6+ Mos 09/28/2015   Influenza-Unspecified 08/27/2012, 07/27/2013, 08/27/2014   PFIZER(Purple Top)SARS-COV-2 Vaccination 02/09/2020, 03/05/2020   Tdap 03/26/2016    TDAP status: Up to date  Flu Vaccine status: Declined, Education has been provided regarding the importance of this vaccine but patient still declined. Advised may receive this vaccine at local pharmacy or Health Dept. Aware to provide a copy of the vaccination record if obtained from local pharmacy or Health Dept. Verbalized acceptance and understanding.  Pneumococcal vaccine status: Due, Education has been provided regarding the importance of this vaccine. Advised may receive this vaccine at local pharmacy or Health Dept. Aware to provide a copy of the vaccination record if obtained from local pharmacy or Health Dept. Verbalized acceptance and understanding.  Covid-19 vaccine status:  Completed vaccines  Qualifies for Shingles Vaccine? Yes   Zostavax completed No   Shingrix Completed?: No.    Education has been provided regarding the importance of this vaccine. Patient has been advised to call insurance company to determine out of pocket expense if they have not yet received this vaccine. Advised may also receive vaccine at local pharmacy or Health Dept. Verbalized acceptance and understanding.  Screening Tests Health Maintenance  Topic Date Due   COVID-19 Vaccine (3 - Pfizer risk series) 04/02/2020   OPHTHALMOLOGY EXAM  12/24/2021   FOOT EXAM  10/05/2022   Zoster Vaccines- Shingrix (1 of 2) 02/23/2023 (Originally 04/22/1965)   Diabetic kidney evaluation - eGFR measurement  02/24/2023 (Originally 09/28/2022)   Diabetic kidney evaluation - Urine ACR  02/24/2023 (Originally 02/28/2020)   INFLUENZA VACCINE  03/27/2023 (Originally 07/27/2022)   Pneumonia Vaccine 61+ Years old (1 - PCV) 11/24/2023 (Originally 04/23/2011)   HEMOGLOBIN A1C  02/05/2023   Medicare Annual Wellness (AWV)  11/24/2023   DTaP/Tdap/Td (2 - Td or Tdap) 03/26/2026   DEXA SCAN  Completed   HPV VACCINES  Aged Out   Hepatitis C Screening  Discontinued   Fecal DNA (Cologuard)  Discontinued    Health Maintenance  Health Maintenance Due  Topic Date Due   COVID-19 Vaccine (3 - Pfizer risk series) 04/02/2020   OPHTHALMOLOGY EXAM  12/24/2021   FOOT EXAM  10/05/2022    Colorectal cancer screening: Type of screening: Cologuard. Completed 2020. Repeat every 3 years  Mammogram status: Completed 2020. Repeat every year  Bone Density status: Completed 2021. Results reflect: Bone density results: NORMAL. Repeat every 2 years.  Lung Cancer Screening: (Low Dose CT Chest recommended if Age 67-80 years, 30 pack-year currently smoking OR have quit w/in 15years.) does not qualify.   Lung Cancer Screening Referral: no  Additional Screening:  Hepatitis C Screening: does not qualify; Completed n/a  Vision  Screening: Recommended annual ophthalmology exams for early detection of glaucoma and other disorders of the eye. Is the patient up to date with their annual eye exam?  Yes  Who is the provider or what is the name of the office in which the patient attends annual eye exams?  Dr. Edwyna Shell If pt is not established with a provider, would they like to be referred to a provider to establish care? No .   Dental Screening: Recommended annual dental exams for proper oral hygiene  Community Resource Referral / Chronic Care Management: CRR required this visit?  No   CCM required this visit?  No      Plan:     I have personally reviewed and noted the following in the patient's chart:   Medical and social history Use of alcohol, tobacco or illicit drugs  Current medications and supplements including opioid prescriptions. Patient is not currently taking opioid prescriptions. Functional ability and status Nutritional status Physical activity Advanced directives List of other physicians Hospitalizations, surgeries, and ER visits in previous 12 months Vitals Screenings to include cognitive, depression, and falls Referrals and appointments  I   Ronnell Freshwater, NP   12/05/2022   Nurse Notes: face to face 25 min

## 2022-11-23 NOTE — Telephone Encounter (Signed)
Called to follow up with patient per provider request. Unable to leave message due to MB being full.

## 2022-11-23 NOTE — Telephone Encounter (Signed)
Is she talking about the cephalexin? She has taken this in the past. She should try taking it with food, or I can send in something for nausea if that is the problem. Let me know what she says.

## 2022-11-24 NOTE — Telephone Encounter (Signed)
Patients husband called in reference to the medicine she was given, I gave him the below message and he said they would try it with food and call us back if they need something for nausea.Rebeca Valdivia Zimmerman Rumple, CMA

## 2022-12-05 DIAGNOSIS — R300 Dysuria: Secondary | ICD-10-CM | POA: Insufficient documentation

## 2022-12-22 ENCOUNTER — Other Ambulatory Visit: Payer: Self-pay | Admitting: Nurse Practitioner

## 2022-12-22 DIAGNOSIS — E1169 Type 2 diabetes mellitus with other specified complication: Secondary | ICD-10-CM

## 2023-01-06 ENCOUNTER — Other Ambulatory Visit: Payer: Self-pay | Admitting: Nurse Practitioner

## 2023-01-06 DIAGNOSIS — E039 Hypothyroidism, unspecified: Secondary | ICD-10-CM

## 2023-01-17 ENCOUNTER — Other Ambulatory Visit: Payer: Self-pay | Admitting: Nurse Practitioner

## 2023-01-17 DIAGNOSIS — Z0000 Encounter for general adult medical examination without abnormal findings: Secondary | ICD-10-CM

## 2023-01-17 DIAGNOSIS — I152 Hypertension secondary to endocrine disorders: Secondary | ICD-10-CM

## 2023-01-17 DIAGNOSIS — E1169 Type 2 diabetes mellitus with other specified complication: Secondary | ICD-10-CM

## 2023-01-17 DIAGNOSIS — E1165 Type 2 diabetes mellitus with hyperglycemia: Secondary | ICD-10-CM

## 2023-01-17 DIAGNOSIS — E039 Hypothyroidism, unspecified: Secondary | ICD-10-CM

## 2023-02-14 ENCOUNTER — Other Ambulatory Visit: Payer: Self-pay

## 2023-02-14 DIAGNOSIS — K219 Gastro-esophageal reflux disease without esophagitis: Secondary | ICD-10-CM

## 2023-02-14 DIAGNOSIS — E1159 Type 2 diabetes mellitus with other circulatory complications: Secondary | ICD-10-CM

## 2023-02-14 MED ORDER — FAMOTIDINE 20 MG PO TABS: 20.0000 mg | ORAL_TABLET | Freq: Two times a day (BID) | ORAL | 0 refills | Status: DC

## 2023-02-14 MED ORDER — AMLODIPINE BESYLATE 5 MG PO TABS: 5.0000 mg | ORAL_TABLET | Freq: Every day | ORAL | 0 refills | Status: DC

## 2023-02-15 ENCOUNTER — Other Ambulatory Visit: Payer: Medicare Other

## 2023-02-15 DIAGNOSIS — E039 Hypothyroidism, unspecified: Secondary | ICD-10-CM

## 2023-02-15 DIAGNOSIS — Z0000 Encounter for general adult medical examination without abnormal findings: Secondary | ICD-10-CM

## 2023-02-15 DIAGNOSIS — E1159 Type 2 diabetes mellitus with other circulatory complications: Secondary | ICD-10-CM

## 2023-02-15 DIAGNOSIS — E1169 Type 2 diabetes mellitus with other specified complication: Secondary | ICD-10-CM | POA: Diagnosis not present

## 2023-02-15 DIAGNOSIS — E782 Mixed hyperlipidemia: Secondary | ICD-10-CM | POA: Diagnosis not present

## 2023-02-15 DIAGNOSIS — E1165 Type 2 diabetes mellitus with hyperglycemia: Secondary | ICD-10-CM | POA: Diagnosis not present

## 2023-02-15 DIAGNOSIS — I152 Hypertension secondary to endocrine disorders: Secondary | ICD-10-CM

## 2023-02-16 LAB — LIPID PANEL
Chol/HDL Ratio: 3.1 ratio (ref 0.0–4.4)
Cholesterol, Total: 159 mg/dL (ref 100–199)
HDL: 51 mg/dL (ref >39)
LDL Chol Calc (NIH): 81 mg/dL (ref 0–99)
Triglycerides: 159 mg/dL — ABNORMAL HIGH (ref 0–149)
VLDL Cholesterol Cal: 27 mg/dL (ref 5–40)

## 2023-02-16 LAB — CBC WITH DIFFERENTIAL/PLATELET
Basophils Absolute: 0 10*3/uL (ref 0.0–0.2)
Basos: 1 %
EOS (ABSOLUTE): 0.2 10*3/uL (ref 0.0–0.4)
Eos: 4 %
Hematocrit: 40.4 % (ref 34.0–46.6)
Hemoglobin: 13.4 g/dL (ref 11.1–15.9)
Immature Grans (Abs): 0 10*3/uL (ref 0.0–0.1)
Immature Granulocytes: 0 %
Lymphocytes Absolute: 1.6 10*3/uL (ref 0.7–3.1)
Lymphs: 32 %
MCH: 30.9 pg (ref 26.6–33.0)
MCHC: 33.2 g/dL (ref 31.5–35.7)
MCV: 93 fL (ref 79–97)
Monocytes Absolute: 0.6 10*3/uL (ref 0.1–0.9)
Monocytes: 12 %
Neutrophils Absolute: 2.5 10*3/uL (ref 1.4–7.0)
Neutrophils: 51 %
Platelets: 264 10*3/uL (ref 150–450)
RBC: 4.33 x10E6/uL (ref 3.77–5.28)
RDW: 12.3 % (ref 11.7–15.4)
WBC: 4.9 10*3/uL (ref 3.4–10.8)

## 2023-02-16 LAB — TSH: TSH: 0.951 u[IU]/mL (ref 0.450–4.500)

## 2023-02-16 LAB — COMPREHENSIVE METABOLIC PANEL
ALT: 36 IU/L — ABNORMAL HIGH (ref 0–32)
AST: 34 IU/L (ref 0–40)
Albumin/Globulin Ratio: 2.2 (ref 1.2–2.2)
Albumin: 4.7 g/dL (ref 3.8–4.8)
Alkaline Phosphatase: 86 IU/L (ref 44–121)
BUN/Creatinine Ratio: 18 (ref 12–28)
BUN: 21 mg/dL (ref 8–27)
Bilirubin Total: 0.4 mg/dL (ref 0.0–1.2)
CO2: 25 mmol/L (ref 20–29)
Calcium: 10.1 mg/dL (ref 8.7–10.3)
Chloride: 99 mmol/L (ref 96–106)
Creatinine, Ser: 1.18 mg/dL — ABNORMAL HIGH (ref 0.57–1.00)
Globulin, Total: 2.1 g/dL (ref 1.5–4.5)
Glucose: 129 mg/dL — ABNORMAL HIGH (ref 70–99)
Potassium: 4.5 mmol/L (ref 3.5–5.2)
Sodium: 140 mmol/L (ref 134–144)
Total Protein: 6.8 g/dL (ref 6.0–8.5)
eGFR: 48 mL/min/{1.73_m2} — ABNORMAL LOW (ref >59)

## 2023-02-16 LAB — HEMOGLOBIN A1C
Est. average glucose Bld gHb Est-mCnc: 137 mg/dL
Hgb A1c MFr Bld: 6.4 % — ABNORMAL HIGH (ref 4.8–5.6)

## 2023-02-23 ENCOUNTER — Ambulatory Visit (INDEPENDENT_AMBULATORY_CARE_PROVIDER_SITE_OTHER): Payer: Medicare Other | Admitting: Nurse Practitioner

## 2023-02-23 ENCOUNTER — Encounter: Payer: Self-pay | Admitting: Nurse Practitioner

## 2023-02-23 VITALS — BP 96/66 | HR 93 | Ht 65.96 in | Wt 160.1 lb

## 2023-02-23 DIAGNOSIS — E1165 Type 2 diabetes mellitus with hyperglycemia: Secondary | ICD-10-CM | POA: Diagnosis not present

## 2023-02-23 DIAGNOSIS — R351 Nocturia: Secondary | ICD-10-CM

## 2023-02-23 DIAGNOSIS — E1159 Type 2 diabetes mellitus with other circulatory complications: Secondary | ICD-10-CM | POA: Diagnosis not present

## 2023-02-23 DIAGNOSIS — I152 Hypertension secondary to endocrine disorders: Secondary | ICD-10-CM | POA: Diagnosis not present

## 2023-02-23 DIAGNOSIS — K219 Gastro-esophageal reflux disease without esophagitis: Secondary | ICD-10-CM | POA: Diagnosis not present

## 2023-02-23 DIAGNOSIS — E1169 Type 2 diabetes mellitus with other specified complication: Secondary | ICD-10-CM

## 2023-02-23 DIAGNOSIS — E785 Hyperlipidemia, unspecified: Secondary | ICD-10-CM

## 2023-02-23 LAB — POCT GLYCOSYLATED HEMOGLOBIN (HGB A1C): HbA1c POC (<> result, manual entry): 6.2 % (ref 4.0–5.6)

## 2023-02-23 MED ORDER — PIOGLITAZONE HCL 15 MG PO TABS: 15.0000 mg | ORAL_TABLET | Freq: Every day | ORAL | 3 refills | Status: DC

## 2023-02-23 MED ORDER — PANTOPRAZOLE SODIUM 20 MG PO TBEC: 20.0000 mg | DELAYED_RELEASE_TABLET | Freq: Every day | ORAL | 3 refills | Status: DC

## 2023-02-23 MED ORDER — OXYBUTYNIN CHLORIDE 5 MG PO TABS: 5.0000 mg | ORAL_TABLET | Freq: Every evening | ORAL | 3 refills | Status: DC | PRN

## 2023-02-23 NOTE — Progress Notes (Signed)
Discussed with patient at time of visit.

## 2023-02-23 NOTE — Progress Notes (Signed)
Established patient visit   Patient: Jill Daugherty   DOB: [birth year]   77 y.o. Female  MRN: [id number] Visit Date: 02/23/2023   Chief Complaint  Patient presents with   Medical Management of Chronic Issues   Subjective    HPI  Follow up  -type 2 diabetes  -HgbA1c 6.2 today.  -weight loss 4 pounds since last visit  -GERD well controlled on protonix.  --needs refills for this today . -hypertension  --blood pressure well managed  -She denies chest pain, chest pressure, or shortness of breath. She denies headaches or visual disturbances. She denies abdominal pain, nausea, vomiting, or changes in bowel or bladder habits.      Medications: Outpatient Medications Prior to Visit  Medication Sig   allopurinol (ZYLOPRIM) 100 MG tablet TAKE 1 TABLET BY MOUTH DAILY   amLODipine (NORVASC) 5 MG tablet Take 1 tablet (5 mg total) by mouth daily.   aspirin EC 81 MG tablet Take 81 mg by mouth daily.   atorvastatin (LIPITOR) 40 MG tablet TAKE 1 TABLET BY MOUTH AT  BEDTIME   Calcium Carbonate (CALCIUM 600 PO) Take by mouth.   cephALEXin (KEFLEX) 250 MG capsule Take 2 capsules (500 mg total) by mouth 3 (three) times daily.   Cholecalciferol (VITAMIN D3) 1000 units CAPS Take 2 capsules by mouth.   desloratadine (CLARINEX) 5 MG tablet Take 1 tablet (5 mg total) by mouth daily.   dicyclomine (BENTYL) 10 MG capsule Take 1 capsule (10 mg total) by mouth 2 (two) times daily as needed for spasms.   Difluprednate 0.05 % EMUL    famotidine (PEPCID) 20 MG tablet Take 1 tablet (20 mg total) by mouth 2 (two) times daily.   fluticasone (FLONASE) 50 MCG/ACT nasal spray USE 1 SPRAY(S) IN EACH NOSTRIL TWICE DAILY FOR 14 DAYS   gatifloxacin (ZYMAXID) 0.5 % SOLN Place 1 drop into the left eye 4 (four) times daily.   hydrochlorothiazide (HYDRODIURIL) 12.5 MG tablet TAKE 1 TABLET BY MOUTH DAILY   losartan (COZAAR) 50 MG tablet TAKE 2 TABLETS BY MOUTH DAILY   metFORMIN (GLUCOPHAGE-XR) 500 MG 24 hr tablet  TAKE 2 TABLETS BY MOUTH DAILY   metoCLOPramide (REGLAN) 5 MG tablet TAKE 1 TABLET BY MOUTH DAILY AS  NEEDED FOR NAUSEA   Miconazole Nitrate 2 % POWD Apply to affected folds of skin twice daily only when red\ irritated   montelukast (SINGULAIR) 10 MG tablet Take 1 tablet (10 mg total) by mouth at bedtime.   nystatin ointment (MYCOSTATIN) Apply 1 application. topically 2 (two) times daily.   phenazopyridine (PYRIDIUM) 200 MG tablet Take 1 tablet (200 mg total) by mouth 3 (three) times daily as needed for pain.   sertraline (ZOLOFT) 25 MG tablet TAKE 1 TABLET BY MOUTH DAILY   [DISCONTINUED] levothyroxine (SYNTHROID) 75 MCG tablet TAKE 1 TABLET BY MOUTH DAILY  BEFORE BREAKFAST   [DISCONTINUED] omeprazole (PRILOSEC) 20 MG capsule TAKE 1 CAPSULE BY MOUTH  ONCE DAILY AS NEEDED FOR  REFLUX/HEARTBURN   [DISCONTINUED] oxybutynin (DITROPAN-XL) 5 MG 24 hr tablet Take 1 tablet (5 mg total) by mouth at bedtime.   [DISCONTINUED] pantoprazole (PROTONIX) 20 MG tablet TAKE 1 TABLET BY MOUTH DAILY   [DISCONTINUED] pioglitazone (ACTOS) 15 MG tablet TAKE 1 TABLET BY MOUTH DAILY   No facility-administered medications prior to visit.    Review of Systems See HPI    Last CBC Lab Results  Component Value Date   WBC 4.9 02/15/2023   HGB 13.4 02/15/2023   HCT  40.4 02/15/2023   MCV 93 02/15/2023   MCH 30.9 02/15/2023   RDW 12.3 02/15/2023   PLT 264 0000000   Last metabolic panel Lab Results  Component Value Date   GLUCOSE 129 (H) 02/15/2023   NA 140 02/15/2023   K 4.5 02/15/2023   CL 99 02/15/2023   CO2 25 02/15/2023   BUN 21 02/15/2023   CREATININE 1.18 (H) 02/15/2023   EGFR 48 (L) 02/15/2023   CALCIUM 10.1 02/15/2023   PROT 6.8 02/15/2023   ALBUMIN 4.7 02/15/2023   LABGLOB 2.1 02/15/2023   AGRATIO 2.2 02/15/2023   BILITOT 0.4 02/15/2023   ALKPHOS 86 02/15/2023   AST 34 02/15/2023   ALT 36 (H) 02/15/2023   Last lipids Lab Results  Component Value Date   CHOL 159 02/15/2023   HDL 51  02/15/2023   LDLCALC 81 02/15/2023   LDLDIRECT 94.0 12/23/2015   TRIG 159 (H) 02/15/2023   CHOLHDL 3.1 02/15/2023   Last hemoglobin A1c Lab Results  Component Value Date   HGBA1C 6.2 02/23/2023   Last thyroid functions Lab Results  Component Value Date   TSH 0.951 02/15/2023   T3TOTAL 94 10/06/2020   Last vitamin D Lab Results  Component Value Date   VD25OH 63.3 10/06/2020       Objective     Today's Vitals   02/23/23 1059  BP: 96/66  Pulse: 93  SpO2: 96%  Weight: 160 lb 1.9 oz (72.6 kg)  Height: 5' 5.96" (1.675 m)   Body mass index is 25.88 kg/m.  BP Readings from Last 3 Encounters:  02/23/23 96/66  11/23/22 134/80  08/05/22 110/67    Wt Readings from Last 3 Encounters:  02/23/23 160 lb 1.9 oz (72.6 kg)  11/23/22 164 lb 6.4 oz (74.6 kg)  08/05/22 159 lb 1.9 oz (72.2 kg)    Physical Exam Vitals and nursing note reviewed.  Constitutional:      Appearance: Normal appearance. She is well-developed.  HENT:     Head: Normocephalic and atraumatic.     Nose: Nose normal.     Mouth/Throat:     Mouth: Mucous membranes are moist.     Pharynx: Oropharynx is clear.  Eyes:     Extraocular Movements: Extraocular movements intact.     Conjunctiva/sclera: Conjunctivae normal.     Pupils: Pupils are equal, round, and reactive to light.  Neck:     Vascular: No carotid bruit.  Cardiovascular:     Rate and Rhythm: Normal rate and regular rhythm.     Pulses: Normal pulses.     Heart sounds: Normal heart sounds.  Pulmonary:     Effort: Pulmonary effort is normal.     Breath sounds: Normal breath sounds.  Abdominal:     Palpations: Abdomen is soft.  Musculoskeletal:        General: Normal range of motion.     Cervical back: Normal range of motion and neck supple.  Lymphadenopathy:     Cervical: No cervical adenopathy.  Skin:    General: Skin is warm and dry.     Capillary Refill: Capillary refill takes less than 2 seconds.  Neurological:     General: No  focal deficit present.     Mental Status: She is alert and oriented to person, place, and time.  Psychiatric:        Mood and Affect: Mood normal.        Behavior: Behavior normal.        Thought Content: Thought  content normal.        Judgment: Judgment normal.     Results for orders placed or performed in visit on 02/23/23  POCT glycosylated hemoglobin (Hb A1C)  Result Value Ref Range   Hemoglobin A1C     HbA1c POC (<> result, manual entry) 6.2 4.0 - 5.6 %   HbA1c, POC (prediabetic range)     HbA1c, POC (controlled diabetic range)      Assessment & Plan    Type 2 diabetes mellitus with hyperglycemia, without long-term current use of insulin (HCC) -     Pioglitazone HCl; Take 1 tablet (15 mg total) by mouth daily.  Dispense: 100 tablet; Refill: 3 -     POCT glycosylated hemoglobin (Hb A1C)  Hypertension associated with diabetes ([place]) Assessment & Plan: Stable. Continue current medication.    Hyperlipidemia associated with type 2 diabetes mellitus ([place]) Assessment & Plan: Stable. Continue current medication.    Gastroesophageal reflux disease without esophagitis Assessment & Plan: Stable. Continue current medication.   Orders: -     Pantoprazole Sodium; Take 1 tablet (20 mg total) by mouth daily.  Dispense: 100 tablet; Refill: 3  Nocturia Assessment & Plan: Trial oxybutynin 5 mg at bedtime. Reassess in 4 months   Orders: -     oxyBUTYnin Chloride; Take 1 tablet (5 mg total) by mouth at bedtime as needed for bladder spasms.  Dispense: 30 tablet; Refill: 3  Type 2 diabetes mellitus with other specified complication, without long-term current use of insulin ([place]) Assessment & Plan: HgbA1c 6.2 today. Continue diabetic medication as prescribed        Return in about 4 months (around 06/24/2023) for diabetes with HgbA1c check with urine microalbumin .         Ronnell Freshwater, NP  [facility] [phone] (phone) [phone]  (fax)  [place]

## 2023-03-17 ENCOUNTER — Other Ambulatory Visit: Payer: Self-pay | Admitting: Nurse Practitioner

## 2023-03-17 DIAGNOSIS — E039 Hypothyroidism, unspecified: Secondary | ICD-10-CM

## 2023-03-20 DIAGNOSIS — R351 Nocturia: Secondary | ICD-10-CM | POA: Insufficient documentation

## 2023-03-20 NOTE — Assessment & Plan Note (Signed)
>>  ASSESSMENT AND PLAN FOR GASTROESOPHAGEAL REFLUX DISEASE WRITTEN ON 03/20/2023  6:59 PM BY BOSCIA, HEATHER E, NP  Stable. Continue current medication.

## 2023-03-20 NOTE — Assessment & Plan Note (Signed)
Stable.  Continue current medication

## 2023-03-20 NOTE — Assessment & Plan Note (Signed)
>>  ASSESSMENT AND PLAN FOR HYPERLIPIDEMIA ASSOCIATED WITH TYPE 2 DIABETES MELLITUS (HCC) WRITTEN ON 03/20/2023  7:03 PM BY BOSCIA, HEATHER E, NP  Stable. Continue current medication.

## 2023-03-20 NOTE — Assessment & Plan Note (Signed)
>>  ASSESSMENT AND PLAN FOR DM2 (DIABETES MELLITUS, TYPE 2) (HCC) WRITTEN ON 03/20/2023  7:03 PM BY BOSCIA, HEATHER E, NP  HgbA1c 6.2 today. Continue diabetic medication as prescribed

## 2023-03-20 NOTE — Assessment & Plan Note (Signed)
Trial oxybutynin 5 mg at bedtime. Reassess in 4 months

## 2023-03-20 NOTE — Assessment & Plan Note (Addendum)
Stable.  Continue current medication

## 2023-03-20 NOTE — Assessment & Plan Note (Signed)
HgbA1c 6.2 today. Continue diabetic medication as prescribed

## 2023-03-23 ENCOUNTER — Other Ambulatory Visit: Payer: Self-pay

## 2023-03-23 ENCOUNTER — Other Ambulatory Visit: Payer: Self-pay | Admitting: Nurse Practitioner

## 2023-03-23 DIAGNOSIS — E1159 Type 2 diabetes mellitus with other circulatory complications: Secondary | ICD-10-CM

## 2023-03-23 DIAGNOSIS — I152 Hypertension secondary to endocrine disorders: Secondary | ICD-10-CM

## 2023-03-23 DIAGNOSIS — K580 Irritable bowel syndrome with diarrhea: Secondary | ICD-10-CM

## 2023-03-23 DIAGNOSIS — N1831 Chronic kidney disease, stage 3a: Secondary | ICD-10-CM

## 2023-03-23 DIAGNOSIS — K582 Mixed irritable bowel syndrome: Secondary | ICD-10-CM

## 2023-03-23 DIAGNOSIS — R112 Nausea with vomiting, unspecified: Secondary | ICD-10-CM

## 2023-03-23 DIAGNOSIS — K219 Gastro-esophageal reflux disease without esophagitis: Secondary | ICD-10-CM

## 2023-03-23 DIAGNOSIS — E1122 Type 2 diabetes mellitus with diabetic chronic kidney disease: Secondary | ICD-10-CM

## 2023-03-23 MED ORDER — SERTRALINE HCL 25 MG PO TABS: 25.0000 mg | ORAL_TABLET | Freq: Every day | ORAL | 3 refills | Status: DC

## 2023-03-23 MED ORDER — METOCLOPRAMIDE HCL 5 MG PO TABS: ORAL_TABLET | ORAL | 1 refills | Status: DC

## 2023-03-23 MED ORDER — HYDROCHLOROTHIAZIDE 12.5 MG PO TABS: 12.5000 mg | ORAL_TABLET | Freq: Every day | ORAL | 3 refills | Status: DC

## 2023-04-21 ENCOUNTER — Encounter: Payer: Self-pay | Admitting: Family Medicine

## 2023-04-21 ENCOUNTER — Ambulatory Visit (INDEPENDENT_AMBULATORY_CARE_PROVIDER_SITE_OTHER): Payer: Medicare Other | Admitting: Family Medicine

## 2023-04-21 VITALS — BP 126/77 | HR 100 | Resp 18 | Ht 65.96 in | Wt 164.0 lb

## 2023-04-21 DIAGNOSIS — R300 Dysuria: Secondary | ICD-10-CM

## 2023-04-21 DIAGNOSIS — E1169 Type 2 diabetes mellitus with other specified complication: Secondary | ICD-10-CM | POA: Diagnosis not present

## 2023-04-21 DIAGNOSIS — Z7984 Long term (current) use of oral hypoglycemic drugs: Secondary | ICD-10-CM | POA: Diagnosis not present

## 2023-04-21 LAB — POCT URINALYSIS DIPSTICK
Bilirubin, UA: NEGATIVE
Blood, UA: NEGATIVE
Glucose, UA: NEGATIVE
Ketones, UA: NEGATIVE
Nitrite, UA: NEGATIVE
Protein, UA: NEGATIVE
Spec Grav, UA: 1.02 (ref 1.010–1.025)
Urobilinogen, UA: 2 E.U./dL — AB
pH, UA: 6 (ref 5.0–8.0)

## 2023-04-21 MED ORDER — CEPHALEXIN 500 MG PO CAPS: 500.0000 mg | ORAL_CAPSULE | Freq: Two times a day (BID) | ORAL | 0 refills | Status: AC

## 2023-04-21 NOTE — Progress Notes (Signed)
   Acute Office Visit  Subjective:     Patient ID: Jill Daugherty, female    DOB: June 20, 1946, 77 y.o.   MRN: [id number]  Chief Complaint  Patient presents with   Recurrent UTI    HPI Patient is in today for urinary symptoms.   For the past 3 days she has the feeling she's 'got to go' to the bathroom.  Also having mild back pain.  Denies dysuria.  Denies fever, chills, nausea vomiting diarrhea.  No history of kidney stones.  Patient allergy to any antibiotics.  ROS      Objective:    BP 126/77 (BP Location: Left Arm, Patient Position: Sitting, Cuff Size: Normal)   Pulse 100   Resp 18   Ht 5' 5.96" (1.675 m)   Wt 164 lb (74.4 kg)   SpO2 95%   BMI 26.50 kg/m    Physical Exam General: Alert, oriented CV: Regular rate and rhythm MSK: No CVA tenderness   Results for orders placed or performed in visit on 04/21/23  POCT Urinalysis Dipstick  Result Value Ref Range   Color, UA Yellow    Clarity, UA Clear    Glucose, UA Negative Negative   Bilirubin, UA Negative    Ketones, UA Negative    Spec Grav, UA 1.020 1.010 - 1.025   Blood, UA Negative    pH, UA 6.0 5.0 - 8.0   Protein, UA Negative Negative   Urobilinogen, UA 2.0 (A) 0.2 or 1.0 E.U./dL   Nitrite, UA Negative    Leukocytes, UA Moderate (2+) (A) Negative   Appearance     Odor          Assessment & Plan:   Problem List Items Addressed This Visit       Endocrine   DM2 (diabetes mellitus, type 2)   Relevant Orders   Urine Microalbumin w/creat. ratio     Other   Dysuria - Primary    Complaints of increased frequency and urgency for the past 3 days.  No painful urination.  Moderate leukocyte Estrace on dipstick but otherwise negative.  Will send off for culture.  Based on history presence of moderate leuk esterase we will send in prescription for antibiotic and send off for culture.  If culture is negative and symptoms not improved, would consider referral to urology for evaluation of her urge  incontinence.      Relevant Orders   POCT Urinalysis Dipstick (Completed)   Urine Culture    Meds ordered this encounter  Medications   cephALEXin (KEFLEX) 500 MG capsule    Sig: Take 1 capsule (500 mg total) by mouth 2 (two) times daily for 5 days.    Dispense:  10 capsule    Refill:  0    No follow-ups on file.  Sandre Kitty, MD

## 2023-04-21 NOTE — Assessment & Plan Note (Signed)
Complaints of increased frequency and urgency for the past 3 days.  No painful urination.  Moderate leukocyte Estrace on dipstick but otherwise negative.  Will send off for culture.  Based on history presence of moderate leuk esterase we will send in prescription for antibiotic and send off for culture.  If culture is negative and symptoms not improved, would consider referral to urology for evaluation of her urge incontinence.

## 2023-04-21 NOTE — Patient Instructions (Signed)
I have sent in a prescription for you for an antibiotic to take 2 times a day for 5 days.    If you still have symptoms after the five days let us know and we can have you talk to a urologist.    Have a great day,   Dr. Constance Goltz

## 2023-04-22 LAB — MICROALBUMIN / CREATININE URINE RATIO
Creatinine, Urine: 107.8 mg/dL
Microalb/Creat Ratio: 11 mg/g creat (ref 0–29)
Microalbumin, Urine: 11.9 ug/mL

## 2023-04-23 LAB — URINE CULTURE

## 2023-05-04 ENCOUNTER — Encounter: Payer: Self-pay | Admitting: Family Medicine

## 2023-05-04 ENCOUNTER — Ambulatory Visit (INDEPENDENT_AMBULATORY_CARE_PROVIDER_SITE_OTHER): Payer: Medicare Other | Admitting: Family Medicine

## 2023-05-04 VITALS — BP 141/68 | HR 79 | Resp 18 | Ht 65.96 in | Wt 161.0 lb

## 2023-05-04 DIAGNOSIS — J029 Acute pharyngitis, unspecified: Secondary | ICD-10-CM | POA: Diagnosis not present

## 2023-05-04 DIAGNOSIS — R0981 Nasal congestion: Secondary | ICD-10-CM

## 2023-05-04 DIAGNOSIS — J189 Pneumonia, unspecified organism: Secondary | ICD-10-CM | POA: Diagnosis not present

## 2023-05-04 LAB — POC INFLUENZA A&B (BINAX/QUICKVUE)
Influenza A, POC: NEGATIVE
Influenza B, POC: NEGATIVE

## 2023-05-04 LAB — POCT RAPID STREP A (OFFICE): Rapid Strep A Screen: NEGATIVE

## 2023-05-04 MED ORDER — AMOXICILLIN-POT CLAVULANATE 875-125 MG PO TABS
1.0000 | ORAL_TABLET | Freq: Two times a day (BID) | ORAL | 0 refills | Status: DC
Start: 2023-05-04 — End: 2023-10-25

## 2023-05-04 MED ORDER — AZITHROMYCIN 250 MG PO TABS
ORAL_TABLET | ORAL | 0 refills | Status: AC
Start: 2023-05-04 — End: 2023-05-09

## 2023-05-04 NOTE — Progress Notes (Signed)
Acute Office Visit  Subjective:     Patient ID: Jill Daugherty, female    DOB: 1946/04/23, 77 y.o.   MRN: [id number]  Chief Complaint  Patient presents with   Sore Throat   Nasal Congestion    HPI Patient is in today for sore throat.  She initially started developing a sore throat 6 days ago.  1 to 2 days later, she developed ear pain, headache, and cough.  Since that time, symptoms have not improved.  She has tried cough syrup and cough drops with minimal relief.  She has taken Tylenol which has taken the edge off of her sore throat pain and headache.  As far she knows, she does not have any sick contacts.  She describes the cough as productive and is consistently coughing up mucus.  Her ear pain has resolved over the past 2 days or so.  She denies any fever or chills, nausea or vomiting, dizziness.  Review of Systems  Constitutional:  Negative for chills, diaphoresis, fever and malaise/fatigue.  HENT:  Positive for congestion, ear pain (Initially, resolved) and sore throat. Negative for hearing loss and sinus pain.   Eyes:  Negative for blurred vision, double vision, pain and discharge.  Respiratory:  Positive for cough. Negative for shortness of breath and wheezing.   Cardiovascular:  Negative for chest pain, palpitations and leg swelling.  Gastrointestinal:  Negative for abdominal pain, constipation, diarrhea, nausea and vomiting.  Musculoskeletal:  Negative for myalgias.  Neurological:  Positive for headaches. Negative for dizziness.     Objective:    BP (!) 141/68 (BP Location: Left Arm, Patient Position: Sitting, Cuff Size: Large)   Pulse 79   Resp 18   Ht 5' 5.96" (1.675 m)   Wt 161 lb (73 kg)   SpO2 97%   BMI 26.02 kg/m   Physical Exam Constitutional:      General: She is not in acute distress.    Appearance: She is well-developed. She is not ill-appearing.  HENT:     Head: Normocephalic and atraumatic.     Right Ear: Tympanic membrane and ear canal  normal. No drainage, swelling or tenderness. Tympanic membrane is not erythematous.     Left Ear: Tympanic membrane and ear canal normal. No drainage, swelling or tenderness. Tympanic membrane is not erythematous.     Nose: No congestion or rhinorrhea.     Mouth/Throat:     Mouth: Mucous membranes are moist. No oral lesions.     Pharynx: Uvula midline. Posterior oropharyngeal erythema (Mild) present. No pharyngeal swelling, oropharyngeal exudate or uvula swelling.     Tonsils: No tonsillar exudate or tonsillar abscesses.  Eyes:     Extraocular Movements:     Right eye: Normal extraocular motion.     Left eye: Normal extraocular motion.     Conjunctiva/sclera: Conjunctivae normal.     Pupils: Pupils are equal, round, and reactive to light.  Neck:     Thyroid: No thyromegaly.  Cardiovascular:     Rate and Rhythm: Normal rate and regular rhythm.     Heart sounds: No murmur heard.    No friction rub. No gallop.  Pulmonary:     Effort: Pulmonary effort is normal. No respiratory distress.     Breath sounds: No wheezing, rhonchi or rales.     Comments: No decreased breath sounds on exam Abdominal:     General: Bowel sounds are normal.  Musculoskeletal:     Cervical back: Normal range of motion and  neck supple.  Lymphadenopathy:     Cervical: No cervical adenopathy.  Skin:    General: Skin is warm and dry.  Neurological:     General: No focal deficit present.     Mental Status: She is alert and oriented to person, place, and time.  Psychiatric:        Mood and Affect: Mood normal.        Behavior: Behavior normal.    Results for orders placed or performed in visit on 05/04/23  POCT rapid strep A  Result Value Ref Range   Rapid Strep A Screen Negative Negative  POC Influenza A&B (Binax test)  Result Value Ref Range   Influenza A, POC Negative Negative   Influenza B, POC Negative Negative     Assessment & Plan:  Sore throat -     POCT rapid strep A -     POC Influenza  A&B(BINAX/QUICKVUE) -     Amoxicillin-Pot Clavulanate; Take 1 tablet by mouth 2 (two) times daily.  Dispense: 14 tablet; Refill: 0 -     Azithromycin; Take 2 tablets on day 1, then 1 tablet daily on days 2 through 5  Dispense: 6 tablet; Refill: 0  Nasal congestion -     POCT rapid strep A -     POC Influenza A&B(BINAX/QUICKVUE) -     Amoxicillin-Pot Clavulanate; Take 1 tablet by mouth 2 (two) times daily.  Dispense: 14 tablet; Refill: 0 -     Azithromycin; Take 2 tablets on day 1, then 1 tablet daily on days 2 through 5  Dispense: 6 tablet; Refill: 0  Community acquired pneumonia, unspecified laterality -     Amoxicillin-Pot Clavulanate; Take 1 tablet by mouth 2 (two) times daily.  Dispense: 14 tablet; Refill: 0 -     Azithromycin; Take 2 tablets on day 1, then 1 tablet daily on days 2 through 5  Dispense: 6 tablet; Refill: 0  Point-of-care tests for strep and influenza negative in office. Symptoms have been ongoing with no clinical improvement for more than 5 days.  Reasonable to treat/cover for community-acquired pneumonia at this point now that it has been 6 days of symptoms with no improvement especially given immunocompromised state with history of diabetes.  Reviewed allergies, no known allergies to antibiotics.  Initiating combination Augmentin and azithromycin given comorbidities of type II diabetes mellitus and hypertension. Recommended to also continue with supportive care including adequate hydration, hot tea and Tylenol for sore throat/headache.  Provided list of cough medicines that are safe to use with hypertension if necessary.  Return if symptoms worsen or fail to improve.  Melida Quitter, PA

## 2023-05-04 NOTE — Patient Instructions (Signed)
Medications & Home Remedies for Upper Respiratory Illness   Note: the following list assumes no pregnancy, normal liver & kidney function and no other drug interactions. These may not be appropriate for everyone. Always ask a pharmacist or qualified medical provider if you have any questions!    Aches/Pains, Fever, Headache OTC Acetaminophen (Tylenol) 500 mg tablets - take max 2 tablets (1000 mg) every 6 hours (4 times per day)  OTC Ibuprofen (Motrin) 200 mg tablets - take max 4 tablets (800 mg) every 6 hours*   Sinus Congestion Prescription Atrovent as directed OTC Nasal Saline if desired to rinse OTC Oxymetolazone (Afrin, others) sparing use due to rebound congestion, NEVER use in kids OTC Phenylephrine (Sudafed) 10 mg tablets every 4 hours (or the 12-hour formulation)* OTC Diphenhydramine (Benadryl) 25 mg tablets - take max 2 tablets every 4 hours   Cough & Sore Throat Prescription cough pills or syrups as directed OTC Dextromethorphan (Robitussin, others) - cough suppressant OTC Guaifenesin (Robitussin, Mucinex, others) - expectorant (helps cough up mucus) (Dextromethorphan and Guaifenesin also come in a combination tablet/syrup) OTC Lozenges w/ Benzocaine + Menthol (Cepacol) Honey - as much as you want! Teas which "coat the throat" - look for ingredients Elm Bark, Licorice Root, Marshmallow Root   Other Prescription Oral Steroids to decrease inflammation and improve energy Prescription Antibiotics if these are necessary for bacterial infection - take ALL, even if you're feeling better  OTC Zinc Lozenges within 24 hours of symptoms onset - mixed evidence this shortens the duration of the common cold Don't waste your money on Vitamin C or Echinacea in acute illness - it's already too late!    *Caution in patients with high blood pressure

## 2023-05-19 ENCOUNTER — Other Ambulatory Visit: Payer: Self-pay | Admitting: Nurse Practitioner

## 2023-05-19 DIAGNOSIS — E1122 Type 2 diabetes mellitus with diabetic chronic kidney disease: Secondary | ICD-10-CM

## 2023-05-19 DIAGNOSIS — K219 Gastro-esophageal reflux disease without esophagitis: Secondary | ICD-10-CM

## 2023-05-19 DIAGNOSIS — K580 Irritable bowel syndrome with diarrhea: Secondary | ICD-10-CM

## 2023-05-19 DIAGNOSIS — E1159 Type 2 diabetes mellitus with other circulatory complications: Secondary | ICD-10-CM

## 2023-05-19 DIAGNOSIS — R112 Nausea with vomiting, unspecified: Secondary | ICD-10-CM

## 2023-05-19 DIAGNOSIS — E1169 Type 2 diabetes mellitus with other specified complication: Secondary | ICD-10-CM

## 2023-06-23 ENCOUNTER — Encounter: Payer: Self-pay | Admitting: Nurse Practitioner

## 2023-06-23 ENCOUNTER — Ambulatory Visit (INDEPENDENT_AMBULATORY_CARE_PROVIDER_SITE_OTHER): Payer: Medicare Other | Admitting: Nurse Practitioner

## 2023-06-23 VITALS — BP 137/69 | HR 71 | Ht 65.96 in | Wt 163.1 lb

## 2023-06-23 DIAGNOSIS — E1159 Type 2 diabetes mellitus with other circulatory complications: Secondary | ICD-10-CM

## 2023-06-23 DIAGNOSIS — E1165 Type 2 diabetes mellitus with hyperglycemia: Secondary | ICD-10-CM | POA: Diagnosis not present

## 2023-06-23 DIAGNOSIS — E1169 Type 2 diabetes mellitus with other specified complication: Secondary | ICD-10-CM

## 2023-06-23 DIAGNOSIS — I152 Hypertension secondary to endocrine disorders: Secondary | ICD-10-CM | POA: Diagnosis not present

## 2023-06-23 DIAGNOSIS — E782 Mixed hyperlipidemia: Secondary | ICD-10-CM | POA: Diagnosis not present

## 2023-06-23 DIAGNOSIS — E039 Hypothyroidism, unspecified: Secondary | ICD-10-CM | POA: Diagnosis not present

## 2023-06-23 DIAGNOSIS — Z7984 Long term (current) use of oral hypoglycemic drugs: Secondary | ICD-10-CM | POA: Diagnosis not present

## 2023-06-23 DIAGNOSIS — J3089 Other allergic rhinitis: Secondary | ICD-10-CM | POA: Diagnosis not present

## 2023-06-23 LAB — POCT GLYCOSYLATED HEMOGLOBIN (HGB A1C): HbA1c POC (<> result, manual entry): 6 % (ref 4.0–5.6)

## 2023-06-23 NOTE — Assessment & Plan Note (Signed)
Continue levothyroxine as prescribed  

## 2023-06-23 NOTE — Progress Notes (Signed)
Established patient visit   Patient: Jill Daugherty   DOB: 08-01-1946   77 y.o. Female  MRN: [id number] Visit Date: 06/23/2023   Chief Complaint  Patient presents with   Medical Management of Chronic Issues   Subjective    HPI  Follow up  -DM2 HgbA1c today is 6.0 today  -due to have eye exam next month. Will call her eye doctor to make appointment  HTN -blood pressure well controlled.  -She denies chest pain, chest pressure, or shortness of breath. She denies headaches or visual disturbances. She denies abdominal pain, nausea, vomiting, or changes in bowel or bladder habits.    Medications: Outpatient Medications Prior to Visit  Medication Sig   allopurinol (ZYLOPRIM) 100 MG tablet TAKE 1 TABLET BY MOUTH DAILY   amLODipine (NORVASC) 5 MG tablet TAKE 1 TABLET BY MOUTH DAILY   amoxicillin-clavulanate (AUGMENTIN) 875-125 MG tablet Take 1 tablet by mouth 2 (two) times daily.   aspirin EC 81 MG tablet Take 81 mg by mouth daily.   atorvastatin (LIPITOR) 40 MG tablet TAKE 1 TABLET BY MOUTH AT  BEDTIME   Calcium Carbonate (CALCIUM 600 PO) Take by mouth.   Cholecalciferol (VITAMIN D3) 1000 units CAPS Take 2 capsules by mouth.   desloratadine (CLARINEX) 5 MG tablet Take 1 tablet (5 mg total) by mouth daily.   dicyclomine (BENTYL) 10 MG capsule TAKE 1 CAPSULE BY MOUTH TWICE  DAILY AS NEEDED FOR SPASM(S)   Difluprednate 0.05 % EMUL    famotidine (PEPCID) 20 MG tablet TAKE 1 TABLET BY MOUTH TWICE  DAILY   fluticasone (FLONASE) 50 MCG/ACT nasal spray USE 1 SPRAY(S) IN EACH NOSTRIL TWICE DAILY FOR 14 DAYS   gatifloxacin (ZYMAXID) 0.5 % SOLN Place 1 drop into the left eye 4 (four) times daily.   hydrochlorothiazide (HYDRODIURIL) 12.5 MG tablet Take 1 tablet (12.5 mg total) by mouth daily.   levothyroxine (SYNTHROID) 75 MCG tablet TAKE 1 TABLET BY MOUTH DAILY  BEFORE BREAKFAST   losartan (COZAAR) 50 MG tablet TAKE 2 TABLETS BY MOUTH DAILY   metFORMIN (GLUCOPHAGE-XR) 500 MG 24 hr  tablet TAKE 2 TABLETS BY MOUTH DAILY   metoCLOPramide (REGLAN) 5 MG tablet TAKE 1 TABLET BY MOUTH DAILY AS  NEEDED FOR NAUSEA   Miconazole Nitrate 2 % POWD Apply to affected folds of skin twice daily only when red\ irritated   montelukast (SINGULAIR) 10 MG tablet Take 1 tablet (10 mg total) by mouth at bedtime.   nystatin ointment (MYCOSTATIN) Apply 1 application. topically 2 (two) times daily.   oxybutynin (DITROPAN) 5 MG tablet Take 1 tablet (5 mg total) by mouth at bedtime as needed for bladder spasms.   pantoprazole (PROTONIX) 20 MG tablet Take 1 tablet (20 mg total) by mouth daily.   phenazopyridine (PYRIDIUM) 200 MG tablet Take 1 tablet (200 mg total) by mouth 3 (three) times daily as needed for pain.   pioglitazone (ACTOS) 15 MG tablet Take 1 tablet (15 mg total) by mouth daily.   sertraline (ZOLOFT) 25 MG tablet Take 1 tablet (25 mg total) by mouth daily.   No facility-administered medications prior to visit.    Review of Systems See HPI    Last CBC Lab Results  Component Value Date   WBC 4.9 02/15/2023   HGB 13.4 02/15/2023   HCT 40.4 02/15/2023   MCV 93 02/15/2023   MCH 30.9 02/15/2023   RDW 12.3 02/15/2023   PLT 264 02/15/2023   Last metabolic panel Lab Results  Component Value  Date   GLUCOSE 129 (H) 02/15/2023   NA 140 02/15/2023   K 4.5 02/15/2023   CL 99 02/15/2023   CO2 25 02/15/2023   BUN 21 02/15/2023   CREATININE 1.18 (H) 02/15/2023   EGFR 48 (L) 02/15/2023   CALCIUM 10.1 02/15/2023   PROT 6.8 02/15/2023   ALBUMIN 4.7 02/15/2023   LABGLOB 2.1 02/15/2023   AGRATIO 2.2 02/15/2023   BILITOT 0.4 02/15/2023   ALKPHOS 86 02/15/2023   AST 34 02/15/2023   ALT 36 (H) 02/15/2023   Last lipids Lab Results  Component Value Date   CHOL 159 02/15/2023   HDL 51 02/15/2023   LDLCALC 81 02/15/2023   LDLDIRECT 94.0 12/23/2015   TRIG 159 (H) 02/15/2023   CHOLHDL 3.1 02/15/2023   Last hemoglobin A1c Lab Results  Component Value Date   HGBA1C 6.0 06/23/2023    Last thyroid functions Lab Results  Component Value Date   TSH 0.951 02/15/2023   T3TOTAL 94 10/06/2020   Last vitamin D Lab Results  Component Value Date   VD25OH 63.3 10/06/2020       Objective     Today's Vitals   06/23/23 0941  BP: 137/69  Pulse: 71  SpO2: 98%  Weight: 163 lb 1.9 oz (74 kg)  Height: 5' 5.96" (1.675 m)   Body mass index is 26.36 kg/m.  BP Readings from Last 3 Encounters:  06/23/23 137/69  05/04/23 (Abnormal) 141/68  04/21/23 126/77    Wt Readings from Last 3 Encounters:  06/23/23 163 lb 1.9 oz (74 kg)  05/04/23 161 lb (73 kg)  04/21/23 164 lb (74.4 kg)    Physical Exam Vitals and nursing note reviewed.  Constitutional:      Appearance: Normal appearance. She is well-developed.  HENT:     Head: Normocephalic and atraumatic.     Nose: Nose normal.     Mouth/Throat:     Mouth: Mucous membranes are moist.     Pharynx: Oropharynx is clear.  Eyes:     Extraocular Movements: Extraocular movements intact.     Conjunctiva/sclera: Conjunctivae normal.     Pupils: Pupils are equal, round, and reactive to light.  Neck:     Vascular: No carotid bruit.  Cardiovascular:     Rate and Rhythm: Normal rate and regular rhythm.     Pulses: Normal pulses.     Heart sounds: Normal heart sounds.  Pulmonary:     Effort: Pulmonary effort is normal.     Breath sounds: Normal breath sounds.  Abdominal:     Palpations: Abdomen is soft.  Musculoskeletal:        General: Normal range of motion.     Cervical back: Normal range of motion and neck supple.  Lymphadenopathy:     Cervical: No cervical adenopathy.  Skin:    General: Skin is warm and dry.     Capillary Refill: Capillary refill takes less than 2 seconds.  Neurological:     General: No focal deficit present.     Mental Status: She is alert and oriented to person, place, and time.  Psychiatric:        Mood and Affect: Mood normal.        Behavior: Behavior normal.        Thought Content:  Thought content normal.        Judgment: Judgment normal.     Results for orders placed or performed in visit on 06/23/23  POCT glycosylated hemoglobin (Hb A1C)  Result Value  Ref Range   Hemoglobin A1C     HbA1c POC (<> result, manual entry) 6.0 4.0 - 5.6 %   HbA1c, POC (prediabetic range)     HbA1c, POC (controlled diabetic range)      Assessment & Plan    Type 2 diabetes mellitus with hyperglycemia, without long-term current use of insulin (HCC) Assessment & Plan: HgbA1c 6.0 today  -continue diabetic medication as prescribed  -encouraged her to limit CHO and sugar and increase daily water intake.  -recheck HgbA1c in 4 months   Orders: -     POCT glycosylated hemoglobin (Hb A1C)  Hypertension associated with diabetes (HCC) Assessment & Plan: Stable. Continue current medication.   Orders: -     POCT glycosylated hemoglobin (Hb A1C)  Mixed diabetic hyperlipidemia associated with type 2 diabetes mellitus (HCC) Assessment & Plan: Cholesterol panel stable.  -Continue current medication.    Acquired hypothyroidism Assessment & Plan: Continue levothyroxine as prescribed    Non-seasonal allergic rhinitis, unspecified trigger Assessment & Plan: Nasal congestion today.  -encouraged her to start zyrtec and/or flonase nasal spray to improve symptoms.       Return in about 4 months (around 10/23/2023) for diabetes with HgbA1c check. MWV 10/2023.         Carlean Jews, NP  [facility] at [facility] [phone] (phone) [phone] (fax)  [facility]

## 2023-06-23 NOTE — Assessment & Plan Note (Signed)
>>  ASSESSMENT AND PLAN FOR DM2 (DIABETES MELLITUS, TYPE 2) (HCC) WRITTEN ON 06/23/2023 10:02 AM BY BOSCIA, HEATHER E, NP  HgbA1c 6.0 today  -continue diabetic medication as prescribed  -encouraged her to limit CHO and sugar and increase daily water intake.  -recheck HgbA1c in 4 months

## 2023-06-23 NOTE — Assessment & Plan Note (Signed)
Cholesterol panel stable.  -Continue current medication.

## 2023-06-23 NOTE — Assessment & Plan Note (Signed)
HgbA1c 6.0 today  -continue diabetic medication as prescribed  -encouraged her to limit CHO and sugar and increase daily water intake.  -recheck HgbA1c in 4 months

## 2023-06-23 NOTE — Assessment & Plan Note (Signed)
Nasal congestion today.  -encouraged her to start zyrtec and/or flonase nasal spray to improve symptoms.

## 2023-06-23 NOTE — Assessment & Plan Note (Signed)
Stable.  Continue current medication

## 2023-07-14 ENCOUNTER — Ambulatory Visit (INDEPENDENT_AMBULATORY_CARE_PROVIDER_SITE_OTHER): Payer: Medicare Other | Admitting: Family Medicine

## 2023-07-14 ENCOUNTER — Telehealth: Payer: Self-pay

## 2023-07-14 VITALS — BP 134/67 | HR 78 | Ht 65.96 in | Wt 163.1 lb

## 2023-07-14 DIAGNOSIS — N3000 Acute cystitis without hematuria: Secondary | ICD-10-CM

## 2023-07-14 LAB — POCT URINALYSIS DIP (CLINITEK)
Bilirubin, UA: NEGATIVE
Blood, UA: NEGATIVE
Glucose, UA: NEGATIVE mg/dL
Ketones, POC UA: NEGATIVE mg/dL
Nitrite, UA: NEGATIVE
POC PROTEIN,UA: NEGATIVE
Spec Grav, UA: 1.015 (ref 1.010–1.025)
Urobilinogen, UA: 2 E.U./dL — AB
pH, UA: 7 (ref 5.0–8.0)

## 2023-07-14 MED ORDER — SULFAMETHOXAZOLE-TRIMETHOPRIM 800-160 MG PO TABS
1.0000 | ORAL_TABLET | Freq: Two times a day (BID) | ORAL | 0 refills | Status: AC
Start: 2023-07-14 — End: 2023-07-17

## 2023-07-14 NOTE — Telephone Encounter (Signed)
Called patient she is advised of her Rx that was sent to the pharmacy

## 2023-07-14 NOTE — Telephone Encounter (Signed)
Called patient LVM to contact the office

## 2023-07-14 NOTE — Progress Notes (Deleted)
Patient is her for possible UTI

## 2023-07-14 NOTE — Progress Notes (Signed)
No results found for any visits on 07/14/23.  Urinalysis positive for leukocytes, symptoms of urinary frequency and pelvic pressure.  Starting 3-day course of Bactrim for acute cystitis while awaiting urine culture results.

## 2023-07-16 LAB — URINE CULTURE

## 2023-07-31 ENCOUNTER — Other Ambulatory Visit: Payer: Self-pay | Admitting: Nurse Practitioner

## 2023-07-31 DIAGNOSIS — K580 Irritable bowel syndrome with diarrhea: Secondary | ICD-10-CM

## 2023-08-17 ENCOUNTER — Ambulatory Visit
Admission: EM | Admit: 2023-08-17 | Discharge: 2023-08-17 | Disposition: A | Discharge status: Home / Self Care | Payer: Medicare Other | Attending: Physician Assistant | Admitting: Physician Assistant

## 2023-08-17 DIAGNOSIS — U071 COVID-19: Secondary | ICD-10-CM | POA: Diagnosis not present

## 2023-08-17 DIAGNOSIS — J069 Acute upper respiratory infection, unspecified: Secondary | ICD-10-CM | POA: Diagnosis not present

## 2023-08-17 NOTE — ED Triage Notes (Signed)
Patient presents to UC for sore throat, dry cough x 2 days ago. Treating symptoms with zyrtec.   Denies fever, SOB, or throat pain.

## 2023-08-17 NOTE — ED Provider Notes (Signed)
[facility]    CSN: [id number] Arrival date & time: 08/17/23  1332      History   Chief Complaint Chief Complaint  Patient presents with   Sore Throat   Cough    HPI Catharine Itkin Fogarty is a 77 y.o. female.   Patient here today for evaluation of sore throat and dry cough that started 2 days ago.  She has not had any fever.  She denies any shortness of breath.  She has taken Zyrtec without resolution.  The history is provided by the patient.    Past Medical History:  Diagnosis Date   History of chicken pox    History of frequent urinary tract infections    Hyperlipidemia    Hypertension    Overactive bladder    Thyroid disease     Patient Active Problem List   Diagnosis Date Noted   Nocturia 03/20/2023   Overweight with body mass index (BMI) 25.0-29.9 01/06/2022   Allergic rhinitis 06/18/2021   Atopic dermatitis 02/25/2021   Urinary incontinence in female 11/07/2018   Irritable bowel syndrome with diarrhea 09/12/2018   Tinea corporis- lower abd folds 09/12/2018   Subacute cough 04/10/2018   Noncompliance with diet and medication regimen 03/20/2018   Family history of early CAD- dad in early 60's 02/07/2018   Family history of cerebrovascular accident (CVA) in mother- in her 65's 02/07/2018   Family history of diabetes mellitus (DM)- PGM- in 3's 02/07/2018   Family history of Alzheimer's disease- Mom in her 72 02/07/2018   Mixed diabetic hyperlipidemia associated with type 2 diabetes mellitus (HCC) 01/11/2018   Environmental and seasonal allergies 01/11/2018   Vitamin D deficiency 01/11/2018   Gout 06/23/2015   GERD (gastroesophageal reflux disease) 06/23/2015   OAB (overactive bladder) 06/23/2015   Type 2 diabetes mellitus with stage 3 chronic kidney disease (HCC) 06/23/2015   Hypertension associated with diabetes (HCC) 06/21/2013   Hyperlipidemia associated with type 2 diabetes mellitus (HCC) 06/21/2013   Hypothyroidism 06/21/2013     History reviewed. No pertinent surgical history.  OB History   No obstetric history on file.      Home Medications    Prior to Admission medications   Medication Sig Start Date End Date Taking? Authorizing Provider  allopurinol (ZYLOPRIM) 100 MG tablet TAKE 1 TABLET BY MOUTH DAILY 05/19/23   Carlean Jews, NP  amLODipine (NORVASC) 5 MG tablet TAKE 1 TABLET BY MOUTH DAILY 05/19/23   Carlean Jews, NP  amoxicillin-clavulanate (AUGMENTIN) 875-125 MG tablet Take 1 tablet by mouth 2 (two) times daily. 05/04/23   Melida Quitter, PA  aspirin EC 81 MG tablet Take 81 mg by mouth daily.    [provider]  atorvastatin (LIPITOR) 40 MG tablet TAKE 1 TABLET BY MOUTH AT  BEDTIME 05/19/23   Carlean Jews, NP  Calcium Carbonate (CALCIUM 600 PO) Take by mouth.    [provider]  Cholecalciferol (VITAMIN D3) 1000 units CAPS Take 2 capsules by mouth.    [provider]  desloratadine (CLARINEX) 5 MG tablet Take 1 tablet (5 mg total) by mouth daily. 08/05/22   Carlean Jews, NP  dicyclomine (BENTYL) 10 MG capsule TAKE 1 CAPSULE BY MOUTH TWICE  DAILY AS NEEDED FOR SPASM 08/01/23   Melida Quitter, PA  Difluprednate 0.05 % EMUL  02/17/21   [provider]  famotidine (PEPCID) 20 MG tablet TAKE 1 TABLET BY MOUTH TWICE  DAILY 05/19/23   Carlean Jews, NP  fluticasone (FLONASE) 50 MCG/ACT nasal spray USE 1 SPRAY(S) IN EACH NOSTRIL TWICE DAILY FOR 14 DAYS 11/10/21   Carlean Jews, NP  gatifloxacin (ZYMAXID) 0.5 % SOLN Place 1 drop into the left eye 4 (four) times daily. 02/17/21   [provider]  hydrochlorothiazide (HYDRODIURIL) 12.5 MG tablet Take 1 tablet (12.5 mg total) by mouth daily. 03/23/23   Carlean Jews, NP  levothyroxine (SYNTHROID) 75 MCG tablet TAKE 1 TABLET BY MOUTH DAILY  BEFORE BREAKFAST 03/18/23   Carlean Jews, NP  losartan (COZAAR) 50 MG tablet TAKE 2 TABLETS BY MOUTH DAILY 05/19/23   Carlean Jews, NP  metFORMIN  (GLUCOPHAGE-XR) 500 MG 24 hr tablet TAKE 2 TABLETS BY MOUTH DAILY 05/19/23   Carlean Jews, NP  metoCLOPramide (REGLAN) 5 MG tablet TAKE 1 TABLET BY MOUTH DAILY AS  NEEDED FOR NAUSEA 05/19/23   Carlean Jews, NP  Miconazole Nitrate 2 % POWD Apply to affected folds of skin twice daily only when red\ irritated 09/20/19   Opalski, Deborah, DO  montelukast (SINGULAIR) 10 MG tablet Take 1 tablet (10 mg total) by mouth at bedtime. 04/01/22   Carlean Jews, NP  nystatin ointment (MYCOSTATIN) Apply 1 application. topically 2 (two) times daily. 05/06/22   Carlean Jews, NP  oxybutynin (DITROPAN) 5 MG tablet Take 1 tablet (5 mg total) by mouth at bedtime as needed for bladder spasms. 02/23/23   Carlean Jews, NP  pantoprazole (PROTONIX) 20 MG tablet Take 1 tablet (20 mg total) by mouth daily. 02/23/23   Carlean Jews, NP  phenazopyridine (PYRIDIUM) 200 MG tablet Take 1 tablet (200 mg total) by mouth 3 (three) times daily as needed for pain. 11/23/22   Carlean Jews, NP  pioglitazone (ACTOS) 15 MG tablet Take 1 tablet (15 mg total) by mouth daily. 02/23/23   Carlean Jews, NP  sertraline (ZOLOFT) 25 MG tablet Take 1 tablet (25 mg total) by mouth daily. 03/23/23   Carlean Jews, NP    Family History Family History  Problem Relation Age of Onset   Heart disease Father    Diabetes Paternal Grandmother    Cancer Paternal Grandmother    Breast cancer Neg Hx     Social History Social History   Tobacco Use   Smoking status: Never    Passive exposure: Never   Smokeless tobacco: Never  Vaping Use   Vaping status: Never Used  Substance Use Topics   Alcohol use: No    Alcohol/week: 0.0 standard drinks of alcohol   Drug use: No     Allergies   Patient has no known allergies.   Review of Systems Review of Systems  Constitutional:  Negative for chills and fever.  HENT:  Positive for congestion and sore throat.   Eyes:  Negative for discharge and redness.  Respiratory:   Positive for cough. Negative for shortness of breath.   Gastrointestinal:  Negative for abdominal pain, diarrhea, nausea and vomiting.     Physical Exam Triage Vital Signs ED Triage Vitals  Encounter Vitals Group     BP      Systolic BP Percentile      Diastolic BP Percentile      Pulse      Resp      Temp      Temp src      SpO2      Weight      Height      Head Circumference  Peak Flow      Pain Score      Pain Loc      Pain Education      Exclude from Growth Chart    No data found.  Updated Vital Signs BP (!) 142/79 (BP Location: Left Arm)   Pulse 91   Temp 98.1 F (36.7 C) (Oral)   Resp 16   SpO2 97%      Physical Exam Vitals and nursing note reviewed.  Constitutional:      General: She is not in acute distress.    Appearance: Normal appearance. She is not ill-appearing.  HENT:     Head: Normocephalic and atraumatic.     Nose: Congestion present.     Mouth/Throat:     Mouth: Mucous membranes are moist.     Pharynx: Oropharynx is clear. No oropharyngeal exudate or posterior oropharyngeal erythema.  Eyes:     Conjunctiva/sclera: Conjunctivae normal.  Cardiovascular:     Rate and Rhythm: Normal rate and regular rhythm.  Pulmonary:     Effort: Pulmonary effort is normal. No respiratory distress.     Breath sounds: Normal breath sounds. No wheezing, rhonchi or rales.  Neurological:     Mental Status: She is alert.  Psychiatric:        Mood and Affect: Mood normal.        Behavior: Behavior normal.        Thought Content: Thought content normal.      UC Treatments / Results  Labs (all labs ordered are listed, but only abnormal results are displayed) Labs Reviewed  SARS CORONAVIRUS 2 (TAT 6-24 HRS)    EKG   Radiology No results found.  Procedures Procedures (including critical care time)  Medications Ordered in UC Medications - No data to display  Initial Impression / Assessment and Plan / UC Course  I have reviewed the triage  vital signs and the nursing notes.  Pertinent labs & imaging results that were available during my care of the patient were reviewed by me and considered in my medical decision making (see chart for details).    Suspect likely viral etiology of symptoms.  Will screen for COVID.  Advised symptomatic treatment and follow-up with any further concerns or worsening symptoms.  Final Clinical Impressions(s) / UC Diagnoses   Final diagnoses:  Acute upper respiratory infection   Discharge Instructions   None    ED Prescriptions   None    PDMP not reviewed this encounter.   Tomi Bamberger, PA-C 08/17/23 1526

## 2023-08-18 LAB — SARS CORONAVIRUS 2 (TAT 6-24 HRS): SARS Coronavirus 2: POSITIVE — AB

## 2023-10-01 ENCOUNTER — Other Ambulatory Visit: Payer: Self-pay | Admitting: Family Medicine

## 2023-10-01 DIAGNOSIS — K580 Irritable bowel syndrome with diarrhea: Secondary | ICD-10-CM

## 2023-10-25 ENCOUNTER — Ambulatory Visit (INDEPENDENT_AMBULATORY_CARE_PROVIDER_SITE_OTHER): Payer: Medicare Other | Admitting: Family Medicine

## 2023-10-25 ENCOUNTER — Encounter: Payer: Self-pay | Admitting: Family Medicine

## 2023-10-25 VITALS — BP 145/64 | HR 75 | Resp 18 | Ht 65.96 in | Wt 171.0 lb

## 2023-10-25 DIAGNOSIS — N1831 Chronic kidney disease, stage 3a: Secondary | ICD-10-CM

## 2023-10-25 DIAGNOSIS — K582 Mixed irritable bowel syndrome: Secondary | ICD-10-CM | POA: Diagnosis not present

## 2023-10-25 DIAGNOSIS — E782 Mixed hyperlipidemia: Secondary | ICD-10-CM | POA: Diagnosis not present

## 2023-10-25 DIAGNOSIS — J309 Allergic rhinitis, unspecified: Secondary | ICD-10-CM | POA: Diagnosis not present

## 2023-10-25 DIAGNOSIS — I152 Hypertension secondary to endocrine disorders: Secondary | ICD-10-CM

## 2023-10-25 DIAGNOSIS — Z7984 Long term (current) use of oral hypoglycemic drugs: Secondary | ICD-10-CM | POA: Diagnosis not present

## 2023-10-25 DIAGNOSIS — N3281 Overactive bladder: Secondary | ICD-10-CM

## 2023-10-25 DIAGNOSIS — M109 Gout, unspecified: Secondary | ICD-10-CM

## 2023-10-25 DIAGNOSIS — E1159 Type 2 diabetes mellitus with other circulatory complications: Secondary | ICD-10-CM

## 2023-10-25 DIAGNOSIS — M150 Primary generalized (osteo)arthritis: Secondary | ICD-10-CM | POA: Diagnosis not present

## 2023-10-25 DIAGNOSIS — Z1159 Encounter for screening for other viral diseases: Secondary | ICD-10-CM

## 2023-10-25 DIAGNOSIS — E1169 Type 2 diabetes mellitus with other specified complication: Secondary | ICD-10-CM

## 2023-10-25 DIAGNOSIS — E039 Hypothyroidism, unspecified: Secondary | ICD-10-CM | POA: Diagnosis not present

## 2023-10-25 DIAGNOSIS — E1122 Type 2 diabetes mellitus with diabetic chronic kidney disease: Secondary | ICD-10-CM | POA: Diagnosis not present

## 2023-10-25 LAB — POCT GLYCOSYLATED HEMOGLOBIN (HGB A1C): HbA1c POC (<> result, manual entry): 6 % (ref 4.0–5.6)

## 2023-10-25 MED ORDER — DESLORATADINE 5 MG PO TABS: 5.0000 mg | ORAL_TABLET | Freq: Every day | ORAL | 2 refills | Status: DC

## 2023-10-25 MED ORDER — HYDROCHLOROTHIAZIDE 12.5 MG PO TABS: 12.5000 mg | ORAL_TABLET | Freq: Every day | ORAL | 3 refills | Status: DC

## 2023-10-25 MED ORDER — ACETAMINOPHEN ER 650 MG PO TBCR: 650.0000 mg | EXTENDED_RELEASE_TABLET | Freq: Three times a day (TID) | ORAL | 1 refills | Status: DC | PRN

## 2023-10-25 MED ORDER — ATORVASTATIN CALCIUM 40 MG PO TABS: 40.0000 mg | ORAL_TABLET | Freq: Every day | ORAL | 2 refills | Status: DC

## 2023-10-25 MED ORDER — MONTELUKAST SODIUM 10 MG PO TABS
10.0000 mg | ORAL_TABLET | Freq: Every day | ORAL | 2 refills | Status: DC
Start: 2023-10-25 — End: 2023-10-26

## 2023-10-25 MED ORDER — ALLOPURINOL 100 MG PO TABS: 100.0000 mg | ORAL_TABLET | Freq: Every day | ORAL | 2 refills | Status: DC

## 2023-10-25 MED ORDER — LOSARTAN POTASSIUM 50 MG PO TABS: 100.0000 mg | ORAL_TABLET | Freq: Every day | ORAL | 2 refills | Status: DC

## 2023-10-25 MED ORDER — DICLOFENAC SODIUM 1 % EX GEL: 2.0000 g | Freq: Four times a day (QID) | CUTANEOUS | 11 refills | Status: DC

## 2023-10-25 MED ORDER — SERTRALINE HCL 25 MG PO TABS: 25.0000 mg | ORAL_TABLET | Freq: Every day | ORAL | 3 refills | Status: DC

## 2023-10-25 MED ORDER — LEVOTHYROXINE SODIUM 75 MCG PO TABS: 75.0000 ug | ORAL_TABLET | Freq: Every day | ORAL | 2 refills | Status: DC

## 2023-10-25 MED ORDER — METFORMIN HCL ER 500 MG PO TB24: ORAL_TABLET | ORAL | 2 refills | Status: DC

## 2023-10-25 MED ORDER — AMLODIPINE BESYLATE 5 MG PO TABS: 5.0000 mg | ORAL_TABLET | Freq: Every day | ORAL | 3 refills | Status: DC

## 2023-10-25 MED ORDER — OXYBUTYNIN CHLORIDE ER 10 MG PO TB24: 10.0000 mg | ORAL_TABLET | Freq: Every day | ORAL | 2 refills | Status: DC

## 2023-10-25 MED ORDER — PIOGLITAZONE HCL 15 MG PO TABS: 15.0000 mg | ORAL_TABLET | Freq: Every day | ORAL | 3 refills | Status: DC

## 2023-10-25 NOTE — Assessment & Plan Note (Addendum)
Increase oxybutynin to 10 mg daily, change to extended release.  Follow-up in 6 weeks to assess efficacy and adjust management as needed.

## 2023-10-25 NOTE — Assessment & Plan Note (Signed)
BP goal <130/80.  Consistently above goal.  Keep blood pressure log for the next 2 weeks and return for nurse BP check.  If still above goal, recommend intensifying hydrochlorothiazide to 25 mg daily (CrCl 42-49 mL/min by Cockroft-Gault equation: 42 corrects for BMI, 49 does not correct for BMI).  Continue amlodipine 5 mg daily, hydrochlorothiazide 12.5 mg daily, losartan 50 mg daily.  Repeat CMP prior to next appointment.

## 2023-10-25 NOTE — Progress Notes (Signed)
Established Patient Office Visit  Subjective   Patient ID: Jill Daugherty, female    DOB: 1946-02-18  Age: 77 y.o. MRN: [id number]  Chief Complaint  Patient presents with   Diabetes    HPI Jill Daugherty is a 77 y.o. female presenting today for follow up of hypertension, diabetes.  She also complains of ongoing nocturia often times needing to get up to use the bathroom 3-4 times nightly.  The oxybutynin that she is currently taking has not improved symptoms a noticeable amount.  She also reports worsening arthritic pain in her legs and back.  She has taken Tylenol arthritis with minimal relief. Hypertension:  Pt denies chest pain, SOB, dizziness, edema, syncope, fatigue or heart palpitations. Taking amlodipine, hydrochlorothiazide, losartan, reports excellent compliance with treatment. Denies side effects.  She is not keeping a blood pressure log at home. Diabetes: denies hypoglycemic events, wounds or sores that are not healing well, increased thirst or urination. Denies vision problems, eye exam completed. Taking pioglitazone and metformin as prescribed without any side effects.    Outpatient Medications Prior to Visit  Medication Sig   aspirin EC 81 MG tablet Take 81 mg by mouth daily.   Calcium Carbonate (CALCIUM 600 PO) Take by mouth.   Cholecalciferol (VITAMIN D3) 1000 units CAPS Take 2 capsules by mouth.   dicyclomine (BENTYL) 10 MG capsule TAKE 1 CAPSULE BY MOUTH TWICE  DAILY AS NEEDED FOR SPASM   Difluprednate 0.05 % EMUL    famotidine (PEPCID) 20 MG tablet TAKE 1 TABLET BY MOUTH TWICE  DAILY   gatifloxacin (ZYMAXID) 0.5 % SOLN Place 1 drop into the left eye 4 (four) times daily.   metoCLOPramide (REGLAN) 5 MG tablet TAKE 1 TABLET BY MOUTH DAILY AS  NEEDED FOR NAUSEA   pantoprazole (PROTONIX) 20 MG tablet Take 1 tablet (20 mg total) by mouth daily.   [DISCONTINUED] allopurinol (ZYLOPRIM) 100 MG tablet TAKE 1 TABLET BY MOUTH DAILY   [DISCONTINUED] amLODipine  (NORVASC) 5 MG tablet TAKE 1 TABLET BY MOUTH DAILY   [DISCONTINUED] amoxicillin-clavulanate (AUGMENTIN) 875-125 MG tablet Take 1 tablet by mouth 2 (two) times daily.   [DISCONTINUED] atorvastatin (LIPITOR) 40 MG tablet TAKE 1 TABLET BY MOUTH AT  BEDTIME   [DISCONTINUED] desloratadine (CLARINEX) 5 MG tablet Take 1 tablet (5 mg total) by mouth daily.   [DISCONTINUED] fluticasone (FLONASE) 50 MCG/ACT nasal spray USE 1 SPRAY(S) IN EACH NOSTRIL TWICE DAILY FOR 14 DAYS   [DISCONTINUED] hydrochlorothiazide (HYDRODIURIL) 12.5 MG tablet Take 1 tablet (12.5 mg total) by mouth daily.   [DISCONTINUED] levothyroxine (SYNTHROID) 75 MCG tablet TAKE 1 TABLET BY MOUTH DAILY  BEFORE BREAKFAST   [DISCONTINUED] losartan (COZAAR) 50 MG tablet TAKE 2 TABLETS BY MOUTH DAILY   [DISCONTINUED] metFORMIN (GLUCOPHAGE-XR) 500 MG 24 hr tablet TAKE 2 TABLETS BY MOUTH DAILY   [DISCONTINUED] Miconazole Nitrate 2 % POWD Apply to affected folds of skin twice daily only when red\ irritated   [DISCONTINUED] montelukast (SINGULAIR) 10 MG tablet Take 1 tablet (10 mg total) by mouth at bedtime.   [DISCONTINUED] nystatin ointment (MYCOSTATIN) Apply 1 application. topically 2 (two) times daily.   [DISCONTINUED] oxybutynin (DITROPAN) 5 MG tablet Take 1 tablet (5 mg total) by mouth at bedtime as needed for bladder spasms.   [DISCONTINUED] phenazopyridine (PYRIDIUM) 200 MG tablet Take 1 tablet (200 mg total) by mouth 3 (three) times daily as needed for pain.   [DISCONTINUED] pioglitazone (ACTOS) 15 MG tablet Take 1 tablet (15 mg total) by mouth daily.   [  DISCONTINUED] sertraline (ZOLOFT) 25 MG tablet Take 1 tablet (25 mg total) by mouth daily.   No facility-administered medications prior to visit.    ROS Negative unless otherwise noted in HPI   Objective:     BP (!) 145/64 (BP Location: Left Arm, Patient Position: Sitting, Cuff Size: Normal)   Pulse 75   Resp 18   Ht 5' 5.96" (1.675 m)   Wt 171 lb (77.6 kg)   SpO2 96%   BMI  27.63 kg/m   Physical Exam Constitutional:      General: She is not in acute distress.    Appearance: Normal appearance.  HENT:     Head: Normocephalic and atraumatic.  Cardiovascular:     Rate and Rhythm: Normal rate and regular rhythm.     Heart sounds: No murmur heard.    No friction rub. No gallop.  Pulmonary:     Effort: Pulmonary effort is normal. No respiratory distress.     Breath sounds: No wheezing, rhonchi or rales.  Skin:    General: Skin is warm and dry.  Neurological:     Mental Status: She is alert and oriented to person, place, and time.    Diabetic Foot Exam - Simple   Simple Foot Form Diabetic Foot exam was performed with the following findings: Yes 10/25/2023 10:54 AM  Visual Inspection No deformities, no ulcerations, no other skin breakdown bilaterally: Yes Sensation Testing Intact to touch and monofilament testing bilaterally: Yes Pulse Check Posterior Tibialis and Dorsalis pulse intact bilaterally: Yes Comments     Assessment & Plan:  Hypertension associated with diabetes (HCC) Assessment & Plan: BP goal <130/80.  Consistently above goal.  Keep blood pressure log for the next 2 weeks and return for nurse BP check.  If still above goal, recommend intensifying hydrochlorothiazide to 25 mg daily (CrCl 42-49 mL/min by Cockroft-Gault equation: 42 corrects for BMI, 49 does not correct for BMI).  Continue amlodipine 5 mg daily, hydrochlorothiazide 12.5 mg daily, losartan 50 mg daily.  Repeat CMP prior to next appointment.  Orders: -     CBC with Differential/Platelet; Future -     Comprehensive metabolic panel; Future -     amLODIPine Besylate; Take 1 tablet (5 mg total) by mouth daily.  Dispense: 90 tablet; Refill: 3 -     hydroCHLOROthiazide; Take 1 tablet (12.5 mg total) by mouth daily.  Dispense: 90 tablet; Refill: 3 -     Losartan Potassium; Take 2 tablets (100 mg total) by mouth daily.  Dispense: 200 tablet; Refill: 2  Mixed diabetic hyperlipidemia  associated with type 2 diabetes mellitus (HCC) Assessment & Plan: Last lipid panel: LDL 81, HDL 51, triglycerides 159.  Repeat fasting labs prior to next appointment.  Until then, continue atorvastatin 40 mg daily.  Orders: -     CBC with Differential/Platelet; Future -     Comprehensive metabolic panel; Future -     Lipid panel; Future -     Atorvastatin Calcium; Take 1 tablet (40 mg total) by mouth at bedtime.  Dispense: 100 tablet; Refill: 2  Type 2 diabetes mellitus with stage 3a chronic kidney disease, without long-term current use of insulin (HCC) Assessment & Plan: A1c 6.0, congratulated on her progress.  Continue pioglitazone 15 mg daily, metformin 500 mg twice daily.  Will continue to monitor.  Most recent EGFR 48, creatinine 1.18.  Will continue to monitor CKD.  Orders: -     POCT glycosylated hemoglobin (Hb A1C) -  Hemoglobin A1c; Future -     Pioglitazone HCl; Take 1 tablet (15 mg total) by mouth daily.  Dispense: 100 tablet; Refill: 3 -     metFORMIN HCl ER; TAKE 2 TABLETS BY MOUTH DAILY  Dispense: 200 tablet; Refill: 2  OAB (overactive bladder) Assessment & Plan: Increase oxybutynin to 10 mg daily, change to extended release.  Follow-up in 6 weeks to assess efficacy and adjust management as needed.  Orders: -     oxyBUTYnin Chloride ER; Take 1 tablet (10 mg total) by mouth at bedtime.  Dispense: 30 tablet; Refill: 2  Screening for viral disease -     Hepatitis C antibody; Future  Allergic rhinitis, unspecified seasonality, unspecified trigger Assessment & Plan: Requesting refills of prescription medications, OTC Zyrtec has been ineffective.  Orders: -     Montelukast Sodium; Take 1 tablet (10 mg total) by mouth at bedtime.  Dispense: 30 tablet; Refill: 2 -     Desloratadine; Take 1 tablet (5 mg total) by mouth daily.  Dispense: 30 tablet; Refill: 2  Hypothyroidism, unspecified type Assessment & Plan: Continue levothyroxine 75 mcg daily.  Check thyroid panel  with lab work.  Orders: -     Levothyroxine Sodium; Take 1 tablet (75 mcg total) by mouth daily before breakfast.  Dispense: 100 tablet; Refill: 2 -     TSH Rfx on Abnormal to Free T4; Future  Irritable bowel syndrome with both constipation and diarrhea -     Sertraline HCl; Take 1 tablet (25 mg total) by mouth daily.  Dispense: 90 tablet; Refill: 3  Gout, unspecified cause, unspecified chronicity, unspecified site -     Allopurinol; Take 1 tablet (100 mg total) by mouth daily.  Dispense: 100 tablet; Refill: 2  Primary osteoarthritis involving multiple joints Assessment & Plan: Failed Aspercreme OTC.  Start diclofenac gel as needed.  Avoid oral NSAIDs due to CKD.  Optimize use of Tylenol as needed.  May also consider changing Zoloft to Cymbalta for both irritable bowel syndrome and osteoarthritis pain.  Orders: -     Diclofenac Sodium; Apply 2 g topically 4 (four) times daily. To affected joint.  Dispense: 100 g; Refill: 11 -     Acetaminophen ER; Take 1 tablet (650 mg total) by mouth every 8 (eight) hours as needed for pain.  Dispense: 30 tablet; Refill: 1    Return in about 6 weeks (around 12/06/2023) for follow-up for urinary symptoms, fasting blood work 1 week before.    Melida Quitter, PA

## 2023-10-25 NOTE — Assessment & Plan Note (Signed)
A1c 6.0, congratulated on her progress.  Continue pioglitazone 15 mg daily, metformin 500 mg twice daily.  Will continue to monitor.  Most recent EGFR 48, creatinine 1.18.  Will continue to monitor CKD.

## 2023-10-25 NOTE — Assessment & Plan Note (Signed)
Failed Aspercreme OTC.  Start diclofenac gel as needed.  Avoid oral NSAIDs due to CKD.  Optimize use of Tylenol as needed.  May also consider changing Zoloft to Cymbalta for both irritable bowel syndrome and osteoarthritis pain.

## 2023-10-25 NOTE — Assessment & Plan Note (Signed)
Last lipid panel: LDL 81, HDL 51, triglycerides 159.  Repeat fasting labs prior to next appointment.  Until then, continue atorvastatin 40 mg daily.

## 2023-10-25 NOTE — Assessment & Plan Note (Signed)
Requesting refills of prescription medications, OTC Zyrtec has been ineffective.

## 2023-10-25 NOTE — Patient Instructions (Addendum)
START oxybutinin 10 mg nightly at bedtime to help you stay asleep without needing to use the bathroom as often. We will check in about this in about 6 weeks.  START montelukast and desloratadine for allergies.  Keep a blood pressure log at home of your blood pressure at least 1 hour after taking your medication, 2-3 times each day. Bring the log to your nurse blood pressure check visit.  I will send a medication for your arthritis to the pharmacy once I find which one is the best option.

## 2023-10-25 NOTE — Assessment & Plan Note (Signed)
Continue levothyroxine 75 mcg daily.  Check thyroid panel with lab work.

## 2023-10-26 ENCOUNTER — Telehealth: Payer: Self-pay

## 2023-10-26 MED ORDER — DESLORATADINE 5 MG PO TABS: 5.0000 mg | ORAL_TABLET | Freq: Every day | ORAL | 2 refills | Status: DC

## 2023-10-26 MED ORDER — ALLOPURINOL 100 MG PO TABS: 100.0000 mg | ORAL_TABLET | Freq: Every day | ORAL | 2 refills | Status: DC

## 2023-10-26 MED ORDER — MONTELUKAST SODIUM 10 MG PO TABS
10.0000 mg | ORAL_TABLET | Freq: Every day | ORAL | 2 refills | Status: DC
Start: 2023-10-26 — End: 2023-12-27

## 2023-10-26 MED ORDER — SERTRALINE HCL 25 MG PO TABS: 25.0000 mg | ORAL_TABLET | Freq: Every day | ORAL | 3 refills | Status: DC

## 2023-10-26 MED ORDER — HYDROCHLOROTHIAZIDE 12.5 MG PO TABS: 12.5000 mg | ORAL_TABLET | Freq: Every day | ORAL | 3 refills | Status: DC

## 2023-10-26 MED ORDER — OXYBUTYNIN CHLORIDE ER 10 MG PO TB24
10.0000 mg | ORAL_TABLET | Freq: Every day | ORAL | 2 refills | Status: DC
Start: 2023-10-26 — End: 2023-12-27

## 2023-10-26 MED ORDER — LEVOTHYROXINE SODIUM 75 MCG PO TABS: 75.0000 ug | ORAL_TABLET | Freq: Every day | ORAL | 2 refills | Status: DC

## 2023-10-26 MED ORDER — AMLODIPINE BESYLATE 5 MG PO TABS: 5.0000 mg | ORAL_TABLET | Freq: Every day | ORAL | 3 refills | Status: DC

## 2023-10-26 MED ORDER — DICLOFENAC SODIUM 1 % EX GEL: 2.0000 g | Freq: Four times a day (QID) | CUTANEOUS | 11 refills | Status: AC

## 2023-10-26 MED ORDER — PIOGLITAZONE HCL 15 MG PO TABS: 15.0000 mg | ORAL_TABLET | Freq: Every day | ORAL | 3 refills | Status: DC

## 2023-10-26 MED ORDER — ACETAMINOPHEN ER 650 MG PO TBCR: 650.0000 mg | EXTENDED_RELEASE_TABLET | Freq: Three times a day (TID) | ORAL | Status: AC | PRN

## 2023-10-26 MED ORDER — LOSARTAN POTASSIUM 50 MG PO TABS: 100.0000 mg | ORAL_TABLET | Freq: Every day | ORAL | 2 refills | Status: DC

## 2023-10-26 MED ORDER — ATORVASTATIN CALCIUM 40 MG PO TABS: 40.0000 mg | ORAL_TABLET | Freq: Every day | ORAL | 2 refills | Status: DC

## 2023-10-26 MED ORDER — METFORMIN HCL ER 500 MG PO TB24: ORAL_TABLET | ORAL | 2 refills | Status: DC

## 2023-10-26 NOTE — Telephone Encounter (Signed)
All meds sent to [facility] as requested

## 2023-10-26 NOTE — Telephone Encounter (Signed)
All refills sent to [facility] yesterday needs to be resent to [facility] [place],  - [phone] per patient

## 2023-10-26 NOTE — Addendum Note (Signed)
Addended by: Saralyn Pilar on: 10/26/2023 12:21 PM   Modules accepted: Orders

## 2023-10-28 ENCOUNTER — Other Ambulatory Visit: Payer: Self-pay | Admitting: Nurse Practitioner

## 2023-10-28 ENCOUNTER — Other Ambulatory Visit: Payer: Self-pay | Admitting: Family Medicine

## 2023-10-28 DIAGNOSIS — J309 Allergic rhinitis, unspecified: Secondary | ICD-10-CM

## 2023-10-28 DIAGNOSIS — R112 Nausea with vomiting, unspecified: Secondary | ICD-10-CM

## 2023-10-28 DIAGNOSIS — N3281 Overactive bladder: Secondary | ICD-10-CM

## 2023-10-28 DIAGNOSIS — K580 Irritable bowel syndrome with diarrhea: Secondary | ICD-10-CM

## 2023-11-08 ENCOUNTER — Ambulatory Visit (INDEPENDENT_AMBULATORY_CARE_PROVIDER_SITE_OTHER): Payer: Medicare Other | Admitting: Family Medicine

## 2023-11-08 VITALS — BP 140/73 | HR 63

## 2023-11-08 DIAGNOSIS — R300 Dysuria: Secondary | ICD-10-CM

## 2023-11-08 DIAGNOSIS — E1169 Type 2 diabetes mellitus with other specified complication: Secondary | ICD-10-CM

## 2023-11-08 DIAGNOSIS — E1122 Type 2 diabetes mellitus with diabetic chronic kidney disease: Secondary | ICD-10-CM | POA: Diagnosis not present

## 2023-11-08 DIAGNOSIS — I152 Hypertension secondary to endocrine disorders: Secondary | ICD-10-CM

## 2023-11-08 DIAGNOSIS — E1159 Type 2 diabetes mellitus with other circulatory complications: Secondary | ICD-10-CM

## 2023-11-08 DIAGNOSIS — E782 Mixed hyperlipidemia: Secondary | ICD-10-CM | POA: Diagnosis not present

## 2023-11-08 DIAGNOSIS — N1831 Chronic kidney disease, stage 3a: Secondary | ICD-10-CM

## 2023-11-08 DIAGNOSIS — Z7984 Long term (current) use of oral hypoglycemic drugs: Secondary | ICD-10-CM

## 2023-11-08 DIAGNOSIS — Z1159 Encounter for screening for other viral diseases: Secondary | ICD-10-CM

## 2023-11-08 DIAGNOSIS — E039 Hypothyroidism, unspecified: Secondary | ICD-10-CM | POA: Diagnosis not present

## 2023-11-08 NOTE — Progress Notes (Signed)
Pt denies CP, SOB, dizziness, or heart palpitations. taking meds as directed without problems. Denies med side effects. 5 min spent with pt. 

## 2023-11-09 LAB — CBC WITH DIFFERENTIAL/PLATELET
Basophils Absolute: 0.1 10*3/uL (ref 0.0–0.2)
Basos: 1 %
EOS (ABSOLUTE): 0.2 10*3/uL (ref 0.0–0.4)
Eos: 3 %
Hematocrit: 41.4 % (ref 34.0–46.6)
Hemoglobin: 13.3 g/dL (ref 11.1–15.9)
Immature Grans (Abs): 0 10*3/uL (ref 0.0–0.1)
Immature Granulocytes: 0 %
Lymphocytes Absolute: 1.7 10*3/uL (ref 0.7–3.1)
Lymphs: 26 %
MCH: 30.9 pg (ref 26.6–33.0)
MCHC: 32.1 g/dL (ref 31.5–35.7)
MCV: 96 fL (ref 79–97)
Monocytes Absolute: 0.6 10*3/uL (ref 0.1–0.9)
Monocytes: 9 %
Neutrophils Absolute: 4.2 10*3/uL (ref 1.4–7.0)
Neutrophils: 61 %
Platelets: 259 10*3/uL (ref 150–450)
RBC: 4.3 x10E6/uL (ref 3.77–5.28)
RDW: 12.5 % (ref 11.7–15.4)
WBC: 6.7 10*3/uL (ref 3.4–10.8)

## 2023-11-09 LAB — COMPREHENSIVE METABOLIC PANEL
ALT: 27 [IU]/L (ref 0–32)
AST: 23 [IU]/L (ref 0–40)
Albumin: 4.5 g/dL (ref 3.8–4.8)
Alkaline Phosphatase: 67 [IU]/L (ref 44–121)
BUN/Creatinine Ratio: 17 (ref 12–28)
BUN: 19 mg/dL (ref 8–27)
Bilirubin Total: 0.3 mg/dL (ref 0.0–1.2)
CO2: 22 mmol/L (ref 20–29)
Calcium: 9.6 mg/dL (ref 8.7–10.3)
Chloride: 102 mmol/L (ref 96–106)
Creatinine, Ser: 1.11 mg/dL — ABNORMAL HIGH (ref 0.57–1.00)
Globulin, Total: 2.4 g/dL (ref 1.5–4.5)
Glucose: 106 mg/dL — ABNORMAL HIGH (ref 70–99)
Potassium: 4.4 mmol/L (ref 3.5–5.2)
Sodium: 140 mmol/L (ref 134–144)
Total Protein: 6.9 g/dL (ref 6.0–8.5)
eGFR: 51 mL/min/{1.73_m2} — ABNORMAL LOW (ref >59)

## 2023-11-09 LAB — LIPID PANEL
Chol/HDL Ratio: 3.2 ratio (ref 0.0–4.4)
Cholesterol, Total: 146 mg/dL (ref 100–199)
HDL: 45 mg/dL (ref >39)
LDL Chol Calc (NIH): 68 mg/dL (ref 0–99)
Triglycerides: 201 mg/dL — ABNORMAL HIGH (ref 0–149)
VLDL Cholesterol Cal: 33 mg/dL (ref 5–40)

## 2023-11-09 LAB — TSH RFX ON ABNORMAL TO FREE T4: TSH: 1.38 u[IU]/mL (ref 0.450–4.500)

## 2023-11-09 LAB — HEMOGLOBIN A1C
Est. average glucose Bld gHb Est-mCnc: 134 mg/dL
Hgb A1c MFr Bld: 6.3 % — ABNORMAL HIGH (ref 4.8–5.6)

## 2023-11-09 LAB — HEPATITIS C ANTIBODY: Hep C Virus Ab: NONREACTIVE

## 2023-11-15 ENCOUNTER — Other Ambulatory Visit: Payer: Self-pay

## 2023-11-15 DIAGNOSIS — E1122 Type 2 diabetes mellitus with diabetic chronic kidney disease: Secondary | ICD-10-CM

## 2023-11-15 DIAGNOSIS — E1169 Type 2 diabetes mellitus with other specified complication: Secondary | ICD-10-CM

## 2023-11-15 DIAGNOSIS — I152 Hypertension secondary to endocrine disorders: Secondary | ICD-10-CM

## 2023-11-29 ENCOUNTER — Other Ambulatory Visit: Payer: Medicare Other

## 2023-11-29 DIAGNOSIS — E1159 Type 2 diabetes mellitus with other circulatory complications: Secondary | ICD-10-CM

## 2023-11-29 DIAGNOSIS — N1831 Chronic kidney disease, stage 3a: Secondary | ICD-10-CM

## 2023-11-29 DIAGNOSIS — E1169 Type 2 diabetes mellitus with other specified complication: Secondary | ICD-10-CM

## 2023-12-07 ENCOUNTER — Ambulatory Visit (INDEPENDENT_AMBULATORY_CARE_PROVIDER_SITE_OTHER): Payer: Medicare Other | Admitting: Family Medicine

## 2023-12-07 ENCOUNTER — Encounter: Payer: Self-pay | Admitting: Family Medicine

## 2023-12-07 VITALS — BP 139/80 | HR 79 | Resp 18 | Ht 65.96 in | Wt 169.0 lb

## 2023-12-07 DIAGNOSIS — N3281 Overactive bladder: Secondary | ICD-10-CM | POA: Diagnosis not present

## 2023-12-07 DIAGNOSIS — I10 Essential (primary) hypertension: Secondary | ICD-10-CM | POA: Insufficient documentation

## 2023-12-07 DIAGNOSIS — R143 Flatulence: Secondary | ICD-10-CM | POA: Insufficient documentation

## 2023-12-07 DIAGNOSIS — I152 Hypertension secondary to endocrine disorders: Secondary | ICD-10-CM

## 2023-12-07 DIAGNOSIS — E1159 Type 2 diabetes mellitus with other circulatory complications: Secondary | ICD-10-CM | POA: Diagnosis not present

## 2023-12-07 DIAGNOSIS — Z7984 Long term (current) use of oral hypoglycemic drugs: Secondary | ICD-10-CM | POA: Diagnosis not present

## 2023-12-07 NOTE — Patient Instructions (Signed)
Avoid gas-producing foods (eg, beans, cabbage, onions, broccoli, brussel sprouts, wheat, and potatoes).  Keep a food diary of what you eat and if the food makes the gas any worse. It may also be worthwhile to try short-term elimination of dairy products for a few weeks to see if there is any improvement, and then try elimination of

## 2023-12-07 NOTE — Assessment & Plan Note (Signed)
BP goal <130/80.  Blood pressure at goal at home, patient has some whitecoat hypertension that causes elevated blood pressure in the office.  Continue amlodipine 5 mg daily, hydrochlorothiazide 12.5 mg daily, losartan 50 mg daily.  Continue ambulatory blood pressure monitoring.

## 2023-12-07 NOTE — Assessment & Plan Note (Signed)
Stable, much improved.  Continue oxybutynin 10 mg extended release.  Will continue to monitor.

## 2023-12-07 NOTE — Progress Notes (Signed)
Established Patient Office Visit  Subjective   Patient ID: Jill Daugherty, female    DOB: [birth year]  Age: 77 y.o. MRN: [id number]  Chief Complaint  Patient presents with   Hypertension    HPI Jill Daugherty is a 77 y.o. female presenting today for follow up of hypertension, overactive bladder.  She would also like to discuss flatulence that is bothersome because it is embarrassing.  It occurs after every single time that she eats, she has not identified any particular trigger foods or beverages.  She has tried Gas-X with no improvement.  Denies abdominal pain, weight loss, hematochezia, fever, diarrhea, nausea/vomiting. Hypertension:  Pt denies chest pain, SOB, dizziness, edema, syncope, fatigue or heart palpitations. Taking amlodipine, hydrochlorothiazide, losartan, reports excellent compliance with treatment. Denies side effects.  Brought blood pressure log from home.  Blood pressure most often around 126/64.  One isolated high reading at 143/66, lowest reading 111/60. Overactive bladder: At last appointment, also increased oxybutynin to 10 mg daily and changed to extended release formulation.  She states that this has provided significant improvement in symptoms.  She only gets up a couple of times a night to use the bathroom, and there are even some nights where she does not need to get up at all.   Outpatient Medications Prior to Visit  Medication Sig   acetaminophen (TYLENOL) 650 MG CR tablet Take 1 tablet (650 mg total) by mouth every 8 (eight) hours as needed for pain.   allopurinol (ZYLOPRIM) 100 MG tablet Take 1 tablet (100 mg total) by mouth daily.   amLODipine (NORVASC) 5 MG tablet Take 1 tablet (5 mg total) by mouth daily.   aspirin EC 81 MG tablet Take 81 mg by mouth daily.   atorvastatin (LIPITOR) 40 MG tablet Take 1 tablet (40 mg total) by mouth at bedtime.   Calcium Carbonate (CALCIUM 600 PO) Take by mouth.   Cholecalciferol (VITAMIN D3) 1000 units CAPS  Take 2 capsules by mouth.   desloratadine (CLARINEX) 5 MG tablet Take 1 tablet (5 mg total) by mouth daily.   diclofenac Sodium (VOLTAREN) 1 % GEL Apply 2 g topically 4 (four) times daily. To affected joint.   dicyclomine (BENTYL) 10 MG capsule TAKE 1 CAPSULE BY MOUTH TWICE  DAILY AS NEEDED FOR SPASM   Difluprednate 0.05 % EMUL    famotidine (PEPCID) 20 MG tablet TAKE 1 TABLET BY MOUTH TWICE  DAILY   gatifloxacin (ZYMAXID) 0.5 % SOLN Place 1 drop into the left eye 4 (four) times daily.   hydrochlorothiazide (HYDRODIURIL) 12.5 MG tablet Take 1 tablet (12.5 mg total) by mouth daily.   levothyroxine (SYNTHROID) 75 MCG tablet Take 1 tablet (75 mcg total) by mouth daily before breakfast.   losartan (COZAAR) 50 MG tablet Take 2 tablets (100 mg total) by mouth daily.   metFORMIN (GLUCOPHAGE-XR) 500 MG 24 hr tablet TAKE 2 TABLETS BY MOUTH DAILY   metoCLOPramide (REGLAN) 5 MG tablet TAKE 1 TABLET BY MOUTH DAILY AS  NEEDED FOR NAUSEA   montelukast (SINGULAIR) 10 MG tablet Take 1 tablet (10 mg total) by mouth at bedtime.   oxybutynin (DITROPAN-XL) 10 MG 24 hr tablet Take 1 tablet (10 mg total) by mouth at bedtime.   pantoprazole (PROTONIX) 20 MG tablet Take 1 tablet (20 mg total) by mouth daily.   pioglitazone (ACTOS) 15 MG tablet Take 1 tablet (15 mg total) by mouth daily.   sertraline (ZOLOFT) 25 MG tablet Take 1 tablet (25 mg total) by mouth  daily.   No facility-administered medications prior to visit.    ROS Negative unless otherwise noted in HPI   Objective:     BP 139/80 (BP Location: Left Arm, Patient Position: Sitting, Cuff Size: Normal)   Pulse 79   Resp 18   Ht 5' 5.96" (1.675 m)   Wt 169 lb (76.7 kg)   SpO2 98%   BMI 27.31 kg/m   Physical Exam Constitutional:      General: She is not in acute distress.    Appearance: Normal appearance.  HENT:     Head: Normocephalic and atraumatic.  Cardiovascular:     Rate and Rhythm: Normal rate and regular rhythm.     Heart sounds: No  murmur heard.    No friction rub. No gallop.  Pulmonary:     Effort: Pulmonary effort is normal. No respiratory distress.     Breath sounds: No wheezing, rhonchi or rales.  Skin:    General: Skin is warm and dry.  Neurological:     Mental Status: She is alert and oriented to person, place, and time.      Assessment & Plan:  Hypertension associated with diabetes (HCC) Assessment & Plan: BP goal <130/80.  Blood pressure at goal at home, patient has some whitecoat hypertension that causes elevated blood pressure in the office.  Continue amlodipine 5 mg daily, hydrochlorothiazide 12.5 mg daily, losartan 50 mg daily.  Continue ambulatory blood pressure monitoring.   OAB (overactive bladder) Assessment & Plan: Stable, much improved.  Continue oxybutynin 10 mg extended release.  Will continue to monitor.   Flatulence Assessment & Plan: No red flag symptoms.  Avoid gas producing foods like beans, cabbage, onions, broccoli, brussel sprouts, wheat, and potatoes.  Recommend keeping food diary to see if certain foods do intensify/worsen symptoms.  It may be worth a trial elimination diet of dairy, gluten, etc.  Will continue to monitor.     Return in about 3 months (around 03/06/2024) for follow-up for HTN, HLD, DM, POC A1C at visit.    Melida Quitter, PA

## 2023-12-07 NOTE — Assessment & Plan Note (Signed)
No red flag symptoms.  Avoid gas producing foods like beans, cabbage, onions, broccoli, brussel sprouts, wheat, and potatoes.  Recommend keeping food diary to see if certain foods do intensify/worsen symptoms.  It may be worth a trial elimination diet of dairy, gluten, etc.  Will continue to monitor.

## 2023-12-27 ENCOUNTER — Other Ambulatory Visit: Payer: Self-pay | Admitting: Family Medicine

## 2023-12-27 DIAGNOSIS — J309 Allergic rhinitis, unspecified: Secondary | ICD-10-CM

## 2023-12-27 DIAGNOSIS — N3281 Overactive bladder: Secondary | ICD-10-CM

## 2024-01-19 ENCOUNTER — Other Ambulatory Visit: Payer: Self-pay | Admitting: Family Medicine

## 2024-01-19 DIAGNOSIS — J309 Allergic rhinitis, unspecified: Secondary | ICD-10-CM

## 2024-01-19 DIAGNOSIS — K580 Irritable bowel syndrome with diarrhea: Secondary | ICD-10-CM

## 2024-02-02 ENCOUNTER — Encounter: Payer: Self-pay | Admitting: Family Medicine

## 2024-02-04 ENCOUNTER — Encounter: Payer: Self-pay | Admitting: Family Medicine

## 2024-02-04 ENCOUNTER — Other Ambulatory Visit: Payer: Self-pay | Admitting: Nurse Practitioner

## 2024-02-04 DIAGNOSIS — K219 Gastro-esophageal reflux disease without esophagitis: Secondary | ICD-10-CM

## 2024-02-06 MED ORDER — PANTOPRAZOLE SODIUM 20 MG PO TBEC: 20.0000 mg | DELAYED_RELEASE_TABLET | Freq: Every day | ORAL | 3 refills | Status: DC

## 2024-02-15 ENCOUNTER — Encounter: Payer: Self-pay | Admitting: Family Medicine

## 2024-02-15 ENCOUNTER — Ambulatory Visit (INDEPENDENT_AMBULATORY_CARE_PROVIDER_SITE_OTHER): Payer: Medicare Other | Admitting: Family Medicine

## 2024-02-15 ENCOUNTER — Ambulatory Visit: Payer: Medicare Other | Admitting: Family Medicine

## 2024-02-15 VITALS — BP 129/70 | HR 80 | Ht 65.96 in | Wt 165.8 lb

## 2024-02-15 DIAGNOSIS — B9689 Other specified bacterial agents as the cause of diseases classified elsewhere: Secondary | ICD-10-CM

## 2024-02-15 DIAGNOSIS — J019 Acute sinusitis, unspecified: Secondary | ICD-10-CM

## 2024-02-15 DIAGNOSIS — H66002 Acute suppurative otitis media without spontaneous rupture of ear drum, left ear: Secondary | ICD-10-CM

## 2024-02-15 MED ORDER — AMOXICILLIN-POT CLAVULANATE 875-125 MG PO TABS: 1.0000 | ORAL_TABLET | Freq: Two times a day (BID) | ORAL | 0 refills | Status: DC

## 2024-02-15 MED ORDER — FLUTICASONE PROPIONATE 50 MCG/ACT NA SUSP: 2.0000 | Freq: Every day | NASAL | 1 refills | Status: AC

## 2024-02-15 NOTE — Progress Notes (Signed)
   Acute Office Visit  Subjective:     Patient ID: Jill Daugherty, female    DOB: 21-Apr-1946, 78 y.o.   MRN: [id number]  Chief Complaint  Patient presents with   Cough    HPI Patient is in today for URI. Symptoms include congestion, cough, and sore throat. Onset of symptoms was 1 week ago, initially felt it was improving which has since worsened.  Denies fever, exposure to anyone with known illness, GI upset.  She is drinking plenty of fluids. Evaluation to date: none. Treatment to date: cough suppressants and decongestants, taking Robitussin DM over-the-counter which has offered some relief for cough.  ROS See HPI    Objective:    BP 129/70   Pulse 80   Ht 5' 5.96" (1.675 m)   Wt 165 lb 12 oz (75.2 kg)   SpO2 100%   BMI 26.79 kg/m   Physical Exam Constitutional:      General: She is not in acute distress.    Appearance: Normal appearance. She is not ill-appearing.  HENT:     Head: Normocephalic and atraumatic.     Right Ear: Tympanic membrane, ear canal and external ear normal.     Left Ear: Ear canal and external ear normal. Tympanic membrane is bulging.     Nose: No congestion or rhinorrhea.     Right Sinus: Maxillary sinus tenderness present. No frontal sinus tenderness.     Left Sinus: Maxillary sinus tenderness present. No frontal sinus tenderness.     Mouth/Throat:     Mouth: Mucous membranes are moist.     Pharynx: Oropharynx is clear. No oropharyngeal exudate or posterior oropharyngeal erythema.  Eyes:     General:        Right eye: No discharge.        Left eye: No discharge.     Conjunctiva/sclera: Conjunctivae normal.     Pupils: Pupils are equal, round, and reactive to light.  Cardiovascular:     Rate and Rhythm: Normal rate and regular rhythm.     Heart sounds: No murmur heard.    No friction rub. No gallop.  Pulmonary:     Effort: Pulmonary effort is normal. No respiratory distress.     Breath sounds: Normal breath sounds. No wheezing,  rhonchi or rales.  Skin:    General: Skin is warm and dry.  Neurological:     Mental Status: She is alert and oriented to person, place, and time.       Assessment & Plan:  Acute bacterial rhinosinusitis -     Amoxicillin-Pot Clavulanate; Take 1 tablet by mouth 2 (two) times daily.  Dispense: 14 tablet; Refill: 0 -     Fluticasone Propionate; Place 2 sprays into both nostrils daily.  Dispense: 16 g; Refill: 1  Non-recurrent acute suppurative otitis media of left ear without spontaneous rupture of tympanic membrane -     Amoxicillin-Pot Clavulanate; Take 1 tablet by mouth 2 (two) times daily.  Dispense: 14 tablet; Refill: 0  Continue Robitussin DM with expectorant and cough suppressant.  Initiate 7-day course of Augmentin for acute otitis media and acute bacterial rhinosinusitis in addition to fluticasone nasal spray for nasal congestion.  Return if symptoms worsen or fail to improve.  Melida Quitter, PA

## 2024-02-15 NOTE — Patient Instructions (Addendum)
-  Take the full bottle of antibiotics to treat your ear infection and sinus infection. -Continue using Robitussin. It has a cough medicine and a decongestant. -Use the nasal spray that I sent in as instructed to improve nasal congestion. -Drinking warm tea with honey can help with the sore throat in addition to Tylenol 500 mg every 4 hours.  PREVENTATIVE CARE: Once you feel better, I encourage you to receive the following preventative care options that are recommended for you.  These are covered by your insurance company because they are for the prevention of illnesses that can be dangerous to your health. You will need to receive them at the pharmacy because of Medicare requirements. -Shingles vaccine: 2 dose series recommended for all adults starting at age 72. -Pneumonia (PCV20) vaccine: Recommended for all adults starting at age 23 to reduce the likelihood of getting pneumonia, having complications, or needing to go to the hospital.  It may be recommended prior to age 36 for people with a weaker immune system due to medications, conditions like diabetes, lung disease, tobacco use, etc.

## 2024-03-20 ENCOUNTER — Other Ambulatory Visit: Payer: Self-pay | Admitting: Nurse Practitioner

## 2024-03-20 ENCOUNTER — Other Ambulatory Visit: Payer: Self-pay | Admitting: *Deleted

## 2024-03-20 DIAGNOSIS — K219 Gastro-esophageal reflux disease without esophagitis: Secondary | ICD-10-CM

## 2024-03-20 MED ORDER — FAMOTIDINE 20 MG PO TABS: 20.0000 mg | ORAL_TABLET | Freq: Two times a day (BID) | ORAL | 3 refills | Status: DC

## 2024-03-23 ENCOUNTER — Other Ambulatory Visit: Payer: Self-pay | Admitting: Family Medicine

## 2024-03-23 DIAGNOSIS — K580 Irritable bowel syndrome with diarrhea: Secondary | ICD-10-CM

## 2024-04-02 NOTE — Progress Notes (Unsigned)
   Established Patient Office Visit  Subjective   Patient ID: Jill Daugherty, female    DOB: 01-Jan-1946  Age: 78 y.o. MRN: [id number]  No chief complaint on file.   HPI   The 10-year ASCVD risk score (Arnett DK, et al., 2019) is: 43.4%  Health Maintenance Due  Topic Date Due   Pneumonia Vaccine 77+ Years old (1 of 2 - PCV) Never done   Zoster Vaccines- Shingrix (1 of 2) Never done   OPHTHALMOLOGY EXAM  12/24/2021   COVID-19 Vaccine (3 - 2024-25 season) 08/28/2023   Diabetic kidney evaluation - Urine ACR  04/20/2024      Objective:     There were no vitals taken for this visit. {Vitals History (Optional):23777}  Physical Exam   No results found for any visits on 04/03/24.      Assessment & Plan:   There are no diagnoses linked to this encounter.   No follow-ups on file.    Sandre Kitty, MD

## 2024-04-03 ENCOUNTER — Ambulatory Visit (INDEPENDENT_AMBULATORY_CARE_PROVIDER_SITE_OTHER): Admitting: Family Medicine

## 2024-04-03 ENCOUNTER — Encounter: Payer: Self-pay | Admitting: Family Medicine

## 2024-04-03 VITALS — BP 115/69 | HR 88 | Ht 65.96 in | Wt 164.1 lb

## 2024-04-03 DIAGNOSIS — Z7984 Long term (current) use of oral hypoglycemic drugs: Secondary | ICD-10-CM

## 2024-04-03 DIAGNOSIS — N1831 Chronic kidney disease, stage 3a: Secondary | ICD-10-CM | POA: Diagnosis not present

## 2024-04-03 DIAGNOSIS — E1159 Type 2 diabetes mellitus with other circulatory complications: Secondary | ICD-10-CM | POA: Diagnosis not present

## 2024-04-03 DIAGNOSIS — E1169 Type 2 diabetes mellitus with other specified complication: Secondary | ICD-10-CM

## 2024-04-03 DIAGNOSIS — I152 Hypertension secondary to endocrine disorders: Secondary | ICD-10-CM | POA: Diagnosis not present

## 2024-04-03 DIAGNOSIS — E782 Mixed hyperlipidemia: Secondary | ICD-10-CM | POA: Diagnosis not present

## 2024-04-03 DIAGNOSIS — N3281 Overactive bladder: Secondary | ICD-10-CM | POA: Diagnosis not present

## 2024-04-03 DIAGNOSIS — E1122 Type 2 diabetes mellitus with diabetic chronic kidney disease: Secondary | ICD-10-CM | POA: Diagnosis not present

## 2024-04-03 LAB — POCT GLYCOSYLATED HEMOGLOBIN (HGB A1C): HbA1c POC (<> result, manual entry): 6.5 % (ref 4.0–5.6)

## 2024-04-03 NOTE — Assessment & Plan Note (Signed)
 Continue metformin and Actos.  A1c 6.5 today.  At next visit get foot exam, UACR

## 2024-04-03 NOTE — Patient Instructions (Addendum)
 It was nice to see you today,  We addressed the following topics today: -Your A1c was 6.5 which is good.  Continue taking your medications. - Your blood pressure was also good. - No changes to any your medications. - When we see you again in 3 months we will do a urine test for diabetic proteinuria   Have a great day,  Frederic Jericho, MD

## 2024-04-03 NOTE — Assessment & Plan Note (Signed)
 Blood pressure at goal.  Continue losartan, amlodipine, HCTZ.  CMP at next visit.

## 2024-04-03 NOTE — Assessment & Plan Note (Signed)
 Continue oxybutynin.

## 2024-05-15 ENCOUNTER — Other Ambulatory Visit: Payer: Self-pay | Admitting: Nurse Practitioner

## 2024-05-15 DIAGNOSIS — K219 Gastro-esophageal reflux disease without esophagitis: Secondary | ICD-10-CM

## 2024-06-26 ENCOUNTER — Other Ambulatory Visit: Payer: Self-pay | Admitting: *Deleted

## 2024-06-26 DIAGNOSIS — N1831 Chronic kidney disease, stage 3a: Secondary | ICD-10-CM

## 2024-06-27 ENCOUNTER — Other Ambulatory Visit

## 2024-06-27 DIAGNOSIS — I152 Hypertension secondary to endocrine disorders: Secondary | ICD-10-CM

## 2024-06-27 DIAGNOSIS — E1169 Type 2 diabetes mellitus with other specified complication: Secondary | ICD-10-CM

## 2024-06-27 DIAGNOSIS — N1831 Chronic kidney disease, stage 3a: Secondary | ICD-10-CM

## 2024-06-28 ENCOUNTER — Ambulatory Visit: Payer: Self-pay | Admitting: Family Medicine

## 2024-06-28 LAB — COMPREHENSIVE METABOLIC PANEL WITH GFR
ALT: 23 IU/L (ref 0–32)
AST: 21 IU/L (ref 0–40)
Albumin: 4.5 g/dL (ref 3.8–4.8)
Alkaline Phosphatase: 90 IU/L (ref 44–121)
BUN/Creatinine Ratio: 20 (ref 12–28)
BUN: 22 mg/dL (ref 8–27)
Bilirubin Total: 0.4 mg/dL (ref 0.0–1.2)
CO2: 20 mmol/L (ref 20–29)
Calcium: 9.7 mg/dL (ref 8.7–10.3)
Chloride: 102 mmol/L (ref 96–106)
Creatinine, Ser: 1.1 mg/dL — ABNORMAL HIGH (ref 0.57–1.00)
Globulin, Total: 2.4 g/dL (ref 1.5–4.5)
Glucose: 134 mg/dL — ABNORMAL HIGH (ref 70–99)
Potassium: 4.8 mmol/L (ref 3.5–5.2)
Sodium: 140 mmol/L (ref 134–144)
Total Protein: 6.9 g/dL (ref 6.0–8.5)
eGFR: 51 mL/min/{1.73_m2} — ABNORMAL LOW (ref >59)

## 2024-06-28 LAB — LIPID PANEL
Chol/HDL Ratio: 3.6 ratio (ref 0.0–4.4)
Cholesterol, Total: 156 mg/dL (ref 100–199)
HDL: 43 mg/dL (ref >39)
LDL Chol Calc (NIH): 80 mg/dL (ref 0–99)
Triglycerides: 194 mg/dL — ABNORMAL HIGH (ref 0–149)
VLDL Cholesterol Cal: 33 mg/dL (ref 5–40)

## 2024-06-28 LAB — HEMOGLOBIN A1C
Est. average glucose Bld gHb Est-mCnc: 128 mg/dL
Hgb A1c MFr Bld: 6.1 % — ABNORMAL HIGH (ref 4.8–5.6)

## 2024-06-28 LAB — MICROALBUMIN / CREATININE URINE RATIO
Creatinine, Urine: 118.9 mg/dL
Microalb/Creat Ratio: 9 mg/g{creat} (ref 0–29)
Microalbumin, Urine: 10.5 ug/mL

## 2024-07-03 ENCOUNTER — Encounter: Payer: Self-pay | Admitting: Family Medicine

## 2024-07-03 ENCOUNTER — Ambulatory Visit (INDEPENDENT_AMBULATORY_CARE_PROVIDER_SITE_OTHER): Admitting: Family Medicine

## 2024-07-03 VITALS — BP 139/75 | HR 76 | Ht 65.96 in | Wt 169.8 lb

## 2024-07-03 DIAGNOSIS — E1122 Type 2 diabetes mellitus with diabetic chronic kidney disease: Secondary | ICD-10-CM | POA: Diagnosis not present

## 2024-07-03 DIAGNOSIS — E782 Mixed hyperlipidemia: Secondary | ICD-10-CM

## 2024-07-03 DIAGNOSIS — E039 Hypothyroidism, unspecified: Secondary | ICD-10-CM | POA: Diagnosis not present

## 2024-07-03 DIAGNOSIS — N1831 Chronic kidney disease, stage 3a: Secondary | ICD-10-CM | POA: Diagnosis not present

## 2024-07-03 DIAGNOSIS — E1169 Type 2 diabetes mellitus with other specified complication: Secondary | ICD-10-CM

## 2024-07-03 DIAGNOSIS — Z7984 Long term (current) use of oral hypoglycemic drugs: Secondary | ICD-10-CM

## 2024-07-03 NOTE — Assessment & Plan Note (Signed)
 Well-controlled on current regimen. A1c is at goal. Up to date on diabetes management tasks for the year. - Continue current medications. - Annual diabetic eye exam recommended. Plans to schedule in August. - Foot exam performed today, no acute issues identified. - Follow up in 6 months, around mid-January, for repeat labs and exam.

## 2024-07-03 NOTE — Assessment & Plan Note (Signed)
 Stable on current medication. TSH has been normal for several years. - Continue current levothyroxine  dose. Advised on standard administration (first thing in morning, 30 mins before food, 4 hours apart from other meds) but recommended continuing current consistent routine to avoid disrupting stable thyroid  levels. - Recheck TSH at next visit.

## 2024-07-03 NOTE — Progress Notes (Signed)
   Established Patient Office Visit  Subjective   Patient ID: Jill Daugherty, female    DOB: [birth year]  Age: 78 y.o. MRN: [id number]  Chief Complaint  Patient presents with   Medical Management of Chronic Issues    HPI  Subjective - Follow-up for diabetes management. Reports feeling great. No new concerns.  Medications Reports taking metformin , a cholesterol medication (statin), aspirin, vitamin D , Bentyl , and thyroid  medication (levothyroxine ). Takes cholesterol medicine and metformin  at night. Takes thyroid  medication around 1-2 p.m. daily. Reports no issues with medications. No refills needed at this time.  PMH, PSH, FH, Social Hx PMHx: Type 2 diabetes, hyperlipidemia, hypothyroidism.  ROS No pertinent positives. Reports no pain, redness, or drainage from toes.   The 10-year ASCVD risk score (Arnett DK, et al., 2019) is: 52.2%  Health Maintenance Due  Topic Date Due   Pneumococcal Vaccine: 50+ Years (1 of 2 - PCV) Never done   Zoster Vaccines- Shingrix  (1 of 2) Never done   OPHTHALMOLOGY EXAM  12/24/2021   COVID-19 Vaccine (3 - 2024-25 season) 08/28/2023   Medicare Annual Wellness (AWV)  07/13/2024      Objective:     BP 139/75   Pulse 76   Ht 5' 5.96 (1.675 m)   Wt 169 lb 12.8 oz (77 kg)   SpO2 96%   BMI 27.44 kg/m    Physical Exam Gen: alert, oriented Pulm: no resp distress Psych: pleasant affect.  Ext: normal monofilament testing.  Normal dp pulses.  Hallus valgus b/l.     No results found for any visits on 07/03/24.      Assessment & Plan:   Type 2 diabetes mellitus with stage 3a chronic kidney disease, without long-term current use of insulin (HCC) Assessment & Plan: Well-controlled on current regimen. A1c is at goal. Up to date on diabetes management tasks for the year. - Continue current medications. - Annual diabetic eye exam recommended. Plans to schedule in August. - Foot exam performed today, no acute issues identified. -  Follow up in 6 months, around mid-January, for repeat labs and exam.   Mixed diabetic hyperlipidemia associated with type 2 diabetes mellitus (HCC) Assessment & Plan: Well-controlled on statin therapy. LDL is at goal. Triglycerides are mildly elevated but do not require treatment change at this time. - Continue current statin.   Acquired hypothyroidism Assessment & Plan: Stable on current medication. TSH has been normal for several years. - Continue current levothyroxine  dose. Advised on standard administration (first thing in morning, 30 mins before food, 4 hours apart from other meds) but recommended continuing current consistent routine to avoid disrupting stable thyroid  levels. - Recheck TSH at next visit.   Left Toe Swelling:  Reports intermittent swelling of right third toe with showering. No associated pain, redness, or drainage. Exam is unremarkable today. - Reassurance provided. - Advised to monitor for redness, increased tenderness, or drainage and to follow up with me or dermatology if these occur.   Return in about 6 months (around 01/03/2025) for DM, HTN, hld.    Toribio MARLA Slain, MD

## 2024-07-03 NOTE — Patient Instructions (Addendum)
 It was nice to see you today,  We addressed the following topics today: -Your A1c was good.  It was 6.1.  No changes made to your medications today - If your toe begins to swell, turn red become tender or start to have drainage then let me know and I will examine it.  Otherwise you can discuss it further with your dermatologist. - Overall your labs were good.  No changes to any medications.  Have a great day,  Rolan Slain, MD

## 2024-07-03 NOTE — Assessment & Plan Note (Signed)
 Well-controlled on statin therapy. LDL is at goal. Triglycerides are mildly elevated but do not require treatment change at this time. - Continue current statin.

## 2024-08-10 ENCOUNTER — Other Ambulatory Visit: Payer: Self-pay | Admitting: Family Medicine

## 2024-08-10 DIAGNOSIS — E1159 Type 2 diabetes mellitus with other circulatory complications: Secondary | ICD-10-CM

## 2024-08-10 DIAGNOSIS — E1169 Type 2 diabetes mellitus with other specified complication: Secondary | ICD-10-CM

## 2024-09-02 ENCOUNTER — Other Ambulatory Visit: Payer: Self-pay | Admitting: Family Medicine

## 2024-09-02 DIAGNOSIS — R112 Nausea with vomiting, unspecified: Secondary | ICD-10-CM

## 2024-09-02 DIAGNOSIS — E1159 Type 2 diabetes mellitus with other circulatory complications: Secondary | ICD-10-CM

## 2024-09-02 DIAGNOSIS — E039 Hypothyroidism, unspecified: Secondary | ICD-10-CM

## 2024-09-02 DIAGNOSIS — N1831 Chronic kidney disease, stage 3a: Secondary | ICD-10-CM

## 2024-09-02 DIAGNOSIS — M109 Gout, unspecified: Secondary | ICD-10-CM

## 2024-09-02 DIAGNOSIS — K582 Mixed irritable bowel syndrome: Secondary | ICD-10-CM

## 2024-09-22 ENCOUNTER — Other Ambulatory Visit: Payer: Self-pay | Admitting: Family Medicine

## 2024-09-22 DIAGNOSIS — E1159 Type 2 diabetes mellitus with other circulatory complications: Secondary | ICD-10-CM

## 2024-09-22 DIAGNOSIS — N3281 Overactive bladder: Secondary | ICD-10-CM

## 2024-09-22 DIAGNOSIS — J309 Allergic rhinitis, unspecified: Secondary | ICD-10-CM

## 2024-12-06 ENCOUNTER — Other Ambulatory Visit: Payer: Self-pay | Admitting: Family Medicine

## 2024-12-06 DIAGNOSIS — J309 Allergic rhinitis, unspecified: Secondary | ICD-10-CM

## 2024-12-06 DIAGNOSIS — K219 Gastro-esophageal reflux disease without esophagitis: Secondary | ICD-10-CM

## 2024-12-06 DIAGNOSIS — K580 Irritable bowel syndrome with diarrhea: Secondary | ICD-10-CM

## 2025-01-08 ENCOUNTER — Ambulatory Visit: Admitting: Family Medicine

## 2025-01-08 ENCOUNTER — Encounter: Payer: Self-pay | Admitting: Family Medicine

## 2025-01-08 VITALS — BP 164/73 | HR 94 | Ht 65.96 in | Wt 178.0 lb

## 2025-01-08 DIAGNOSIS — N1831 Chronic kidney disease, stage 3a: Secondary | ICD-10-CM

## 2025-01-08 DIAGNOSIS — M109 Gout, unspecified: Secondary | ICD-10-CM

## 2025-01-08 DIAGNOSIS — E1159 Type 2 diabetes mellitus with other circulatory complications: Secondary | ICD-10-CM

## 2025-01-08 DIAGNOSIS — E039 Hypothyroidism, unspecified: Secondary | ICD-10-CM | POA: Diagnosis not present

## 2025-01-08 DIAGNOSIS — E559 Vitamin D deficiency, unspecified: Secondary | ICD-10-CM

## 2025-01-08 DIAGNOSIS — Z7984 Long term (current) use of oral hypoglycemic drugs: Secondary | ICD-10-CM

## 2025-01-08 DIAGNOSIS — E1169 Type 2 diabetes mellitus with other specified complication: Secondary | ICD-10-CM

## 2025-01-08 DIAGNOSIS — E782 Mixed hyperlipidemia: Secondary | ICD-10-CM

## 2025-01-08 DIAGNOSIS — I152 Hypertension secondary to endocrine disorders: Secondary | ICD-10-CM

## 2025-01-08 DIAGNOSIS — E1122 Type 2 diabetes mellitus with diabetic chronic kidney disease: Secondary | ICD-10-CM | POA: Diagnosis not present

## 2025-01-08 DIAGNOSIS — F331 Major depressive disorder, recurrent, moderate: Secondary | ICD-10-CM

## 2025-01-08 DIAGNOSIS — E119 Type 2 diabetes mellitus without complications: Secondary | ICD-10-CM | POA: Diagnosis not present

## 2025-01-08 DIAGNOSIS — F329 Major depressive disorder, single episode, unspecified: Secondary | ICD-10-CM | POA: Insufficient documentation

## 2025-01-08 LAB — POCT GLYCOSYLATED HEMOGLOBIN (HGB A1C): HbA1c POC (<> result, manual entry): 6.6 %

## 2025-01-08 MED ORDER — OLMESARTAN-AMLODIPINE-HCTZ 20-5-12.5 MG PO TABS: 1.0000 | ORAL_TABLET | Freq: Every day | ORAL | 1 refills | Status: AC

## 2025-01-08 MED ORDER — SERTRALINE HCL 50 MG PO TABS: 50.0000 mg | ORAL_TABLET | Freq: Every day | ORAL | 3 refills | Status: AC

## 2025-01-08 NOTE — Patient Instructions (Addendum)
 It was nice to see you today,  We addressed the following topics today: - we made the following medication changes today:  Stop taking your actos  (pioglitazone ) until I see you in 3 months I increased your zoloft  from 25mg  to 50mg  for nerves Instead of taking Losartan , hydrochlorothiazide , and amlodipine  separately - I started a combination pill that includes olmesartan , hydrochlorothiazide , and amlodipine .  Take this instead.   Have a great day,  Rolan Slain, MD

## 2025-01-08 NOTE — Progress Notes (Unsigned)
 "  Established Patient Office Visit  Subjective   Patient ID: Ty Oshima, female    DOB: 11-23-1946  Age: 79 y.o. MRN: [id number]  Chief Complaint  Patient presents with   Medical Management of Chronic Issues    Discussed the use of AI scribe software for clinical note transcription with the patient, who gave verbal consent to proceed.  History of Present Illness   Anabella Capshaw is a 79 year old female who presents for a six-month follow-up and to discuss starting medication for anxiety related to her husband's illness.  She reports increased anxiety related to her husband's illness and upcoming brain surgery. Her anxiety is worse when attending his therapy sessions and when his speech clarity varies, and she is interested in starting medication for this.  She takes allopurinol , amlodipine  5 mg, aspirin, Lipitor, calcium  and vitamin D , Clarinex  as needed, Voltaren  gel, Bentyl  as needed, hydrochlorothiazide , Flonase  as needed, Synthroid , losartan  50 mg, metformin  1000 mg daily (one pill in the morning and one at night), Reglan  as needed, Singulair  as needed, oxybutynin  at night, pantoprazole  daily, sertraline , and Actos .  She checks her blood pressure at home with readings from 117 to 147, depending on activity. She adjusts her diet to reduce abdominal symptoms, which worsen when eating out. She has recently started exercising again.          The 10-year ASCVD risk score (Arnett DK, et al., 2019) is: 52.9%  Health Maintenance Due  Topic Date Due   OPHTHALMOLOGY EXAM  07/28/2023   Medicare Annual Wellness (AWV)  07/13/2024   COVID-19 Vaccine (3 - 2025-26 season) 08/27/2024      Objective:     BP (!) 164/73   Pulse 94   Ht 5' 5.96 (1.675 m)   Wt 178 lb (80.7 kg)   SpO2 94%   BMI 28.76 kg/m  {Vitals History (Optional):23777}  Physical Exam            Results for orders placed or performed in visit on 01/08/25  POCT glycosylated hemoglobin  (Hb A1C)  Result Value Ref Range   Hemoglobin A1C     HbA1c POC (<> result, manual entry) 6.6 4.0 - 5.6 %   HbA1c, POC (prediabetic range)     HbA1c, POC (controlled diabetic range)          Assessment & Plan:   Hypertension associated with diabetes (HCC) Assessment & Plan: Hypertension managed with losartan , amlodipine , and hydrochlorothiazide . Blood pressure readings at home are variable. Plan to switch losartan  to olmesartan  for potentially better blood pressure control and to reduce pill burden. - Switched losartan  to olmesartan . - Combined olmesartan , amlodipine , and hydrochlorothiazide  into a single pill. - Monitor blood pressure at home.  Orders: -     POCT glycosylated hemoglobin (Hb A1C)  Type 2 diabetes mellitus with stage 3a chronic kidney disease, without long-term current use of insulin (HCC) Assessment & Plan: Type 2 diabetes managed with metformin  and Actos . Recent A1c was 6.6, indicating good control. Plan to discontinue Actos  to assess its contribution to blood sugar control and potential side effects. - Discontinued Actos . - Continue metformin  1000 mg daily. - Will recheck A1c in 3 months. - Will order labs for kidney function, cholesterol, urine protein, and thyroid  function before next visit.   Orders: -     POCT glycosylated hemoglobin (Hb A1C)  Moderate episode of recurrent major depressive disorder (HCC) Assessment & Plan: Current treatment with sertraline  25 mg daily. Considering increasing the dose  to improve mood symptoms. - Increased sertraline  to 50 mg daily. - Instructed to monitor for improvement over 4-6 weeks. - Will follow up in 3 months to assess need for further adjustment.   Other orders -     Olmesartan -amLODIPine -HCTZ; Take 1 tablet by mouth daily.  Dispense: 90 tablet; Refill: 1 -     Sertraline  HCl; Take 1 tablet (50 mg total) by mouth daily.  Dispense: 90 tablet; Refill: 3        Return in about 3 months (around 04/08/2025)  for DM.    Toribio MARLA Slain, MD  "

## 2025-01-08 NOTE — Assessment & Plan Note (Signed)
 Current treatment with sertraline  25 mg daily. Considering increasing the dose to improve mood symptoms. - Increased sertraline  to 50 mg daily. - Instructed to monitor for improvement over 4-6 weeks. - Will follow up in 3 months to assess need for further adjustment.

## 2025-01-08 NOTE — Assessment & Plan Note (Signed)
 Hypertension managed with losartan , amlodipine , and hydrochlorothiazide . Blood pressure readings at home are variable. Plan to switch losartan  to olmesartan  for potentially better blood pressure control and to reduce pill burden. - Switched losartan  to olmesartan . - Combined olmesartan , amlodipine , and hydrochlorothiazide  into a single pill. - Monitor blood pressure at home.

## 2025-01-08 NOTE — Assessment & Plan Note (Signed)
 Type 2 diabetes managed with metformin  and Actos . Recent A1c was 6.6, indicating good control. Plan to discontinue Actos  to assess its contribution to blood sugar control and potential side effects. - Discontinued Actos . - Continue metformin  1000 mg daily. - Will recheck A1c in 3 months. - Will order labs for kidney function, cholesterol, urine protein, and thyroid  function before next visit.

## 2025-04-04 ENCOUNTER — Other Ambulatory Visit

## 2025-04-11 ENCOUNTER — Ambulatory Visit: Admitting: Family Medicine
